# Patient Record
Sex: Female | Born: 1940
Health system: Southern US, Community
[De-identification: ages and names within clinical notes are randomized; demographics above are authoritative.]

## PROBLEM LIST (undated history)

## (undated) DIAGNOSIS — E079 Disorder of thyroid, unspecified: Secondary | ICD-10-CM

## (undated) DIAGNOSIS — R7303 Prediabetes: Secondary | ICD-10-CM

## (undated) DIAGNOSIS — C801 Malignant (primary) neoplasm, unspecified: Secondary | ICD-10-CM

## (undated) DIAGNOSIS — M5430 Sciatica, unspecified side: Secondary | ICD-10-CM

## (undated) DIAGNOSIS — K219 Gastro-esophageal reflux disease without esophagitis: Secondary | ICD-10-CM

## (undated) DIAGNOSIS — H409 Unspecified glaucoma: Secondary | ICD-10-CM

## (undated) DIAGNOSIS — K589 Irritable bowel syndrome without diarrhea: Secondary | ICD-10-CM

## (undated) DIAGNOSIS — Z85850 Personal history of malignant neoplasm of thyroid: Secondary | ICD-10-CM

## (undated) DIAGNOSIS — F419 Anxiety disorder, unspecified: Secondary | ICD-10-CM

## (undated) DIAGNOSIS — T7840XA Allergy, unspecified, initial encounter: Secondary | ICD-10-CM

## (undated) DIAGNOSIS — R0600 Dyspnea, unspecified: Secondary | ICD-10-CM

## (undated) DIAGNOSIS — H269 Unspecified cataract: Secondary | ICD-10-CM

## (undated) DIAGNOSIS — I10 Essential (primary) hypertension: Secondary | ICD-10-CM

## (undated) DIAGNOSIS — M1990 Unspecified osteoarthritis, unspecified site: Secondary | ICD-10-CM

## (undated) DIAGNOSIS — R011 Cardiac murmur, unspecified: Secondary | ICD-10-CM

## (undated) DIAGNOSIS — Z973 Presence of spectacles and contact lenses: Secondary | ICD-10-CM

## (undated) DIAGNOSIS — E559 Vitamin D deficiency, unspecified: Secondary | ICD-10-CM

## (undated) DIAGNOSIS — E785 Hyperlipidemia, unspecified: Secondary | ICD-10-CM

## (undated) HISTORY — DX: Disorder of thyroid, unspecified: E07.9

## (undated) HISTORY — DX: Essential (primary) hypertension: I10

## (undated) HISTORY — DX: Hyperlipidemia, unspecified: E78.5

## (undated) HISTORY — DX: Sciatica, unspecified side: M54.30

## (undated) HISTORY — DX: Cardiac murmur, unspecified: R01.1

## (undated) HISTORY — DX: Allergy, unspecified, initial encounter: T78.40XA

## (undated) HISTORY — DX: Anxiety disorder, unspecified: F41.9

## (undated) HISTORY — DX: Gastro-esophageal reflux disease without esophagitis: K21.9

## (undated) HISTORY — DX: Irritable bowel syndrome, unspecified: K58.9

## (undated) HISTORY — DX: Prediabetes: R73.03

## (undated) HISTORY — DX: Vitamin D deficiency, unspecified: E55.9

## (undated) HISTORY — DX: Unspecified cataract: H26.9

## (undated) HISTORY — DX: Dyspnea, unspecified: R06.00

## (undated) HISTORY — DX: Unspecified osteoarthritis, unspecified site: M19.90

## (undated) HISTORY — DX: Personal history of malignant neoplasm of thyroid: Z85.850

## (undated) HISTORY — DX: Malignant (primary) neoplasm, unspecified: C80.1

## (undated) HISTORY — PX: COLONOSCOPY: SHX174

## (undated) HISTORY — PX: CATARACT EXTRACTION, BILATERAL: SHX1313

## (undated) HISTORY — PX: EYE SURGERY: SHX253

---

## 1984-03-18 HISTORY — PX: THYROIDECTOMY, PARTIAL: SHX18

## 1984-03-18 HISTORY — PX: THYROID SURGERY: SHX805

## 1998-11-28 ENCOUNTER — Encounter (INDEPENDENT_AMBULATORY_CARE_PROVIDER_SITE_OTHER): Payer: Self-pay | Admitting: Specialist

## 1998-11-28 ENCOUNTER — Other Ambulatory Visit: Admission: RE | Admit: 1998-11-28 | Discharge: 1998-11-28 | Payer: Self-pay | Admitting: *Deleted

## 1999-08-22 ENCOUNTER — Other Ambulatory Visit: Admission: RE | Admit: 1999-08-22 | Discharge: 1999-08-22 | Payer: Self-pay | Admitting: *Deleted

## 2000-06-26 ENCOUNTER — Other Ambulatory Visit: Admission: RE | Admit: 2000-06-26 | Discharge: 2000-06-26 | Payer: Self-pay | Admitting: Internal Medicine

## 2001-10-15 ENCOUNTER — Other Ambulatory Visit: Admission: RE | Admit: 2001-10-15 | Discharge: 2001-10-15 | Payer: Self-pay | Admitting: Internal Medicine

## 2003-10-25 ENCOUNTER — Other Ambulatory Visit: Admission: RE | Admit: 2003-10-25 | Discharge: 2003-10-25 | Payer: Self-pay | Admitting: Internal Medicine

## 2004-03-18 HISTORY — PX: CARDIAC CATHETERIZATION: SHX172

## 2004-09-13 ENCOUNTER — Ambulatory Visit (HOSPITAL_COMMUNITY): Admission: RE | Admit: 2004-09-13 | Discharge: 2004-09-13 | Payer: Self-pay | Admitting: Cardiology

## 2005-11-06 ENCOUNTER — Other Ambulatory Visit: Admission: RE | Admit: 2005-11-06 | Discharge: 2005-11-06 | Payer: Self-pay | Admitting: Internal Medicine

## 2007-02-16 ENCOUNTER — Ambulatory Visit (HOSPITAL_COMMUNITY): Admission: RE | Admit: 2007-02-16 | Discharge: 2007-02-16 | Payer: Self-pay | Admitting: Internal Medicine

## 2008-02-25 ENCOUNTER — Encounter: Payer: Self-pay | Admitting: Critical Care Medicine

## 2008-03-08 ENCOUNTER — Ambulatory Visit: Admission: RE | Admit: 2008-03-08 | Discharge: 2008-03-08 | Payer: Self-pay | Admitting: Internal Medicine

## 2008-03-18 DIAGNOSIS — C801 Malignant (primary) neoplasm, unspecified: Secondary | ICD-10-CM

## 2008-03-18 HISTORY — DX: Malignant (primary) neoplasm, unspecified: C80.1

## 2008-03-18 HISTORY — PX: BREAST LUMPECTOMY: SHX2

## 2008-03-22 DIAGNOSIS — K219 Gastro-esophageal reflux disease without esophagitis: Secondary | ICD-10-CM

## 2008-03-22 DIAGNOSIS — K589 Irritable bowel syndrome without diarrhea: Secondary | ICD-10-CM

## 2008-03-22 DIAGNOSIS — E039 Hypothyroidism, unspecified: Secondary | ICD-10-CM

## 2008-03-22 DIAGNOSIS — E782 Mixed hyperlipidemia: Secondary | ICD-10-CM

## 2008-03-22 DIAGNOSIS — Z85850 Personal history of malignant neoplasm of thyroid: Secondary | ICD-10-CM

## 2008-03-22 DIAGNOSIS — R42 Dizziness and giddiness: Secondary | ICD-10-CM

## 2008-03-23 ENCOUNTER — Ambulatory Visit: Payer: Self-pay | Admitting: Critical Care Medicine

## 2008-03-23 ENCOUNTER — Encounter (INDEPENDENT_AMBULATORY_CARE_PROVIDER_SITE_OTHER): Payer: Self-pay | Admitting: *Deleted

## 2008-04-04 ENCOUNTER — Ambulatory Visit (HOSPITAL_COMMUNITY): Admission: RE | Admit: 2008-04-04 | Discharge: 2008-04-04 | Payer: Self-pay | Admitting: Critical Care Medicine

## 2008-04-04 ENCOUNTER — Encounter: Payer: Self-pay | Admitting: Critical Care Medicine

## 2008-04-11 ENCOUNTER — Telehealth: Payer: Self-pay | Admitting: Critical Care Medicine

## 2008-04-20 ENCOUNTER — Ambulatory Visit: Payer: Self-pay | Admitting: Internal Medicine

## 2008-04-20 ENCOUNTER — Ambulatory Visit: Payer: Self-pay | Admitting: Critical Care Medicine

## 2008-04-28 ENCOUNTER — Encounter: Payer: Self-pay | Admitting: Critical Care Medicine

## 2008-05-02 ENCOUNTER — Encounter: Payer: Self-pay | Admitting: Critical Care Medicine

## 2008-11-14 ENCOUNTER — Encounter: Admission: RE | Admit: 2008-11-14 | Discharge: 2008-11-14 | Payer: Self-pay

## 2008-11-22 ENCOUNTER — Encounter: Admission: RE | Admit: 2008-11-22 | Discharge: 2008-11-22 | Payer: Self-pay | Admitting: General Surgery

## 2008-11-23 ENCOUNTER — Ambulatory Visit (HOSPITAL_BASED_OUTPATIENT_CLINIC_OR_DEPARTMENT_OTHER): Admission: RE | Admit: 2008-11-23 | Discharge: 2008-11-23 | Payer: Self-pay | Admitting: General Surgery

## 2008-11-23 ENCOUNTER — Encounter (INDEPENDENT_AMBULATORY_CARE_PROVIDER_SITE_OTHER): Payer: Self-pay | Admitting: General Surgery

## 2008-11-25 ENCOUNTER — Ambulatory Visit: Payer: Self-pay | Admitting: Oncology

## 2008-12-21 LAB — CBC WITH DIFFERENTIAL/PLATELET
BASO%: 0.5 % (ref 0.0–2.0)
Basophils Absolute: 0 10*3/uL (ref 0.0–0.1)
EOS%: 1.2 % (ref 0.0–7.0)
HCT: 39.9 % (ref 34.8–46.6)
HGB: 13.6 g/dL (ref 11.6–15.9)
LYMPH%: 27 % (ref 14.0–49.7)
MCH: 30.8 pg (ref 25.1–34.0)
MCHC: 34.2 g/dL (ref 31.5–36.0)
MCV: 90.2 fL (ref 79.5–101.0)
MONO%: 11.2 % (ref 0.0–14.0)
NEUT%: 60.1 % (ref 38.4–76.8)
lymph#: 1.5 10*3/uL (ref 0.9–3.3)

## 2008-12-21 LAB — COMPREHENSIVE METABOLIC PANEL
AST: 19 U/L (ref 0–37)
BUN: 12 mg/dL (ref 6–23)
Calcium: 9.5 mg/dL (ref 8.4–10.5)
Chloride: 106 mEq/L (ref 96–112)
Creatinine, Ser: 0.85 mg/dL (ref 0.40–1.20)
Total Bilirubin: 0.4 mg/dL (ref 0.3–1.2)

## 2008-12-21 LAB — VITAMIN D 25 HYDROXY (VIT D DEFICIENCY, FRACTURES): Vit D, 25-Hydroxy: 54 ng/mL (ref 30–89)

## 2008-12-21 LAB — LACTATE DEHYDROGENASE: LDH: 160 U/L (ref 94–250)

## 2008-12-27 ENCOUNTER — Ambulatory Visit: Admission: RE | Admit: 2008-12-27 | Discharge: 2009-02-24 | Payer: Self-pay | Admitting: Radiation Oncology

## 2009-03-01 ENCOUNTER — Ambulatory Visit: Payer: Self-pay | Admitting: Oncology

## 2009-03-18 LAB — HM COLONOSCOPY

## 2009-06-05 ENCOUNTER — Ambulatory Visit: Payer: Self-pay | Admitting: Oncology

## 2009-06-07 LAB — COMPREHENSIVE METABOLIC PANEL
Alkaline Phosphatase: 54 U/L (ref 39–117)
BUN: 11 mg/dL (ref 6–23)
CO2: 24 mEq/L (ref 19–32)
Chloride: 102 mEq/L (ref 96–112)
Sodium: 138 mEq/L (ref 135–145)
Total Bilirubin: 0.3 mg/dL (ref 0.3–1.2)
Total Protein: 7.1 g/dL (ref 6.0–8.3)

## 2009-06-07 LAB — CBC WITH DIFFERENTIAL/PLATELET
EOS%: 1.2 % (ref 0.0–7.0)
HGB: 13.1 g/dL (ref 11.6–15.9)
LYMPH%: 23.6 % (ref 14.0–49.7)
MCH: 31.5 pg (ref 25.1–34.0)
MCHC: 34.8 g/dL (ref 31.5–36.0)
MONO#: 0.5 10*3/uL (ref 0.1–0.9)
MONO%: 9.3 % (ref 0.0–14.0)
NEUT%: 65.2 % (ref 38.4–76.8)
lymph#: 1.3 10*3/uL (ref 0.9–3.3)

## 2009-06-07 LAB — LACTATE DEHYDROGENASE: LDH: 160 U/L (ref 94–250)

## 2010-03-26 ENCOUNTER — Other Ambulatory Visit
Admission: RE | Admit: 2010-03-26 | Discharge: 2010-03-26 | Payer: Self-pay | Source: Home / Self Care | Admitting: Internal Medicine

## 2010-06-18 ENCOUNTER — Ambulatory Visit (HOSPITAL_COMMUNITY)
Admission: RE | Admit: 2010-06-18 | Discharge: 2010-06-18 | Disposition: A | Discharge status: Home / Self Care | Payer: Medicare Other | Source: Ambulatory Visit | Attending: Oncology | Admitting: Oncology

## 2010-06-18 ENCOUNTER — Other Ambulatory Visit: Payer: Self-pay | Admitting: Oncology

## 2010-06-18 ENCOUNTER — Encounter (HOSPITAL_BASED_OUTPATIENT_CLINIC_OR_DEPARTMENT_OTHER): Payer: Self-pay | Admitting: Oncology

## 2010-06-18 DIAGNOSIS — D0590 Unspecified type of carcinoma in situ of unspecified breast: Secondary | ICD-10-CM | POA: Insufficient documentation

## 2010-06-18 DIAGNOSIS — R0789 Other chest pain: Secondary | ICD-10-CM | POA: Insufficient documentation

## 2010-06-18 DIAGNOSIS — D099 Carcinoma in situ, unspecified: Secondary | ICD-10-CM

## 2010-06-18 DIAGNOSIS — C50519 Malignant neoplasm of lower-outer quadrant of unspecified female breast: Secondary | ICD-10-CM

## 2010-06-18 LAB — CBC WITH DIFFERENTIAL/PLATELET
Basophils Absolute: 0 10*3/uL (ref 0.0–0.1)
HCT: 37.7 % (ref 34.8–46.6)
HGB: 12.8 g/dL (ref 11.6–15.9)
MCV: 90.9 fL (ref 79.5–101.0)
RDW: 13.7 % (ref 11.2–14.5)
lymph#: 1.3 10*3/uL (ref 0.9–3.3)

## 2010-06-19 LAB — COMPREHENSIVE METABOLIC PANEL
CO2: 27 mEq/L (ref 19–32)
Chloride: 105 mEq/L (ref 96–112)
Total Bilirubin: 0.3 mg/dL (ref 0.3–1.2)

## 2010-06-19 LAB — CANCER ANTIGEN 27.29: CA 27.29: 27 U/mL (ref 0–39)

## 2010-06-19 LAB — LACTATE DEHYDROGENASE: LDH: 134 U/L (ref 94–250)

## 2010-06-22 LAB — COMPREHENSIVE METABOLIC PANEL
Albumin: 3.9 g/dL (ref 3.5–5.2)
CO2: 28 mEq/L (ref 19–32)
Chloride: 104 mEq/L (ref 96–112)
GFR calc non Af Amer: >60 mL/min (ref >60)
Glucose, Bld: 91 mg/dL (ref 70–99)
Total Protein: 6.8 g/dL (ref 6.0–8.3)

## 2010-06-22 LAB — DIFFERENTIAL
Basophils Absolute: 0 10*3/uL (ref 0.0–0.1)
Basophils Relative: 1 % (ref 0–1)
Eosinophils Absolute: 0.1 10*3/uL (ref 0.0–0.7)
Monocytes Relative: 9 % (ref 3–12)
Neutro Abs: 5.3 10*3/uL (ref 1.7–7.7)
Neutrophils Relative %: 69 % (ref 43–77)

## 2010-06-22 LAB — URINE MICROSCOPIC-ADD ON

## 2010-06-22 LAB — CBC
Hemoglobin: 12.8 g/dL (ref 12.0–15.0)
Platelets: 226 10*3/uL (ref 150–400)
RBC: 4.18 MIL/uL (ref 3.87–5.11)
RDW: 13.9 % (ref 11.5–15.5)
WBC: 7.8 10*3/uL (ref 4.0–10.5)

## 2010-06-22 LAB — URINALYSIS, ROUTINE W REFLEX MICROSCOPIC
Bilirubin Urine: NEGATIVE
Leukocytes, UA: NEGATIVE
Nitrite: NEGATIVE
Protein, ur: NEGATIVE mg/dL
Urobilinogen, UA: 0.2 mg/dL (ref 0.0–1.0)

## 2010-06-22 LAB — CANCER ANTIGEN 27.29: CA 27.29: 21 U/mL (ref 0–39)

## 2010-07-27 ENCOUNTER — Encounter (INDEPENDENT_AMBULATORY_CARE_PROVIDER_SITE_OTHER): Payer: Self-pay | Admitting: General Surgery

## 2010-08-03 NOTE — H&P (Signed)
NAME:  ZERAH, HILYER NO.:  [id number]   MEDICAL RECORD NO.:  [id number]          PATIENT TYPE:  OIB   LOCATION:                               FACILITY:  [facility]   PHYSICIAN:  Peter M. Swaziland, M.D.  DATE OF BIRTH:  July 09, 1940   DATE OF ADMISSION:  09/13/2004  DATE OF DISCHARGE:                                HISTORY & PHYSICAL   HISTORY OF PRESENT ILLNESS:  Ms. Digiulio is a very pleasant 70 year old  white female with history of severe hypercholesterolemia who is referred for  evaluation of dyspnea.  She describes symptoms of dyspnea and substernal  chest tightness for the past six weeks.  There is no clear relation to  activity or meals.  She has had a fairly extensive GI evaluation including  upper endoscopy and abdominal ultrasound by Dr. Kinnie Scales which were  unremarkable.  She has taken Nexium without relief.  She subsequently was  referred for exercise stress testing.  She walked 6 minutes with complaints  of substernal chest tightness and dyspnea.  ECG showed 2 mm of ST segment  depression in the lateral leads consistent with ischemia.  Given these  findings, she is now admitted for cardiac catheterization.   PAST MEDICAL HISTORY:  1.  Hypercholesterolemia.  2.  History of thyroid cancer status post thyroid surgery in 1986.   ALLERGIES:  DARVON.   CURRENT MEDICATIONS:  1.  Synthroid 0.112 mg daily.  2.  Nexium 40 mg per day.  3.  Evista daily.  4.  Flax seed oil daily.   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  She is married.  She has two children.  She denies  tobacco or alcohol use.   FAMILY HISTORY:  Father died of colon cancer at age 29.  Mother died at age  26 of a drowning.   REVIEW OF SYSTEMS:  Otherwise unremarkable.   PHYSICAL EXAMINATION:  GENERAL:  The patient is a pleasant white female in  no apparent distress.  VITAL SIGNS:  Blood pressure 122/78, pulse 80 and regular, rate 131,  respirations normal.  HEENT:  Pupils equal, round, and reactive.  Sclerae clear.  Oropharynx is  clear.  NECK:  Without JVD, adenopathy, thyromegaly, or bruits.  LUNGS: Clear.  CARDIAC:  Regular rate and rhythm without gallop, murmur, rub, or click.  ABDOMEN:  Soft and nontender without masses or bruits.  EXTREMITIES:  Without edema.  Pulses are 2+ and symmetric.  NEUROLOGIC:  Exam is nonfocal.   LABORATORY DATA:  ECG shows normal sinus rhythm with left axis deviation and  incomplete right bundle branch block.   CBC, BMET, and coags are unremarkable.  Previous lipoprotein now shows a  very high LDL particle number of 2288 with a small LDL particle of 1478  which is also high risk.  Her total LDL was 192 with HDL 49, triglycerides  of 161.   IMPRESSION:  1.  Symptoms of dyspnea with abnormal stress test suggesting lateral wall      ischemia.  2.  Severe hypercholesterolemia.  3.  Remote history of thyroid cancer.   PLAN:  I have recommended diagnostic cardiac catheterization with further  therapy pending these results.           ______________________________  Peter M. Swaziland, M.D.     PMJ/MEDQ  D:  09/11/2004  T:  09/11/2004  Job:  [id number]   cc:   Lovenia Kim, D.O.  [address]  [place]  [place] [address]  Fax: [phone]

## 2010-08-03 NOTE — Cardiovascular Report (Signed)
NAME:  Haley Shah, Haley Shah                ACCOUNT NO.:  [id number]   MEDICAL RECORD NO.:  [id number]          PATIENT TYPE:  OIB   LOCATION:  2899                         FACILITY:  [facility]   PHYSICIAN:  Peter M. Swaziland, M.D.  DATE OF BIRTH:  [birth year]   DATE OF PROCEDURE:  09/13/2004  DATE OF DISCHARGE:                              CARDIAC CATHETERIZATION   INDICATIONS FOR PROCEDURE:  The patient is a 70 year old white female with a  history of severe hypercholesterolemia who presents with symptoms of  dyspnea. Routine stress testing is suggestive of lateral ischemia and  reproduces the patient's symptoms.   PROCEDURES:  Left heart catheterization, coronary and left ventricular  angiography, access is via the right femoral artery using the standard  Seldinger technique.   EQUIPMENT:  6-French 4 cm right and left Judkins catheter, 6-French pigtail  catheter, 6-French arterial sheath.   CONTRAST:  90 cc of Omnipaque.   MEDICATIONS:  Local anesthesia with 1% Xylocaine.   HEMODYNAMIC DATA:  Aortic pressure is 123/64 with a mean of 89 mmHg. Left  ventricular pressure is 121 with an EDP of 5 mmHg.   ANGIOGRAPHIC DATA:  The left coronary artery arises and distributes  normally. The left main coronary is normal.   The left anterior descending artery is very tortuous. There is no disease  noted. The mid to distal LAD is small in caliber but there is a large  diagonal branch that comes off prior to this.   The left circumflex coronary is also very tortuous but is normal.   The right coronary is a dominant vessel and is normal.   Left ventricular angiography was performed in the RAO view. This  demonstrates normal left ventricular size and contractility with normal  systolic function. Ejection fraction is estimated at 70%. There is no mitral  valve prolapse or insufficiency. The aortic root appears normal in size.   IMPRESSION:  1. Normal coronary anatomy.  2. Normal left ventricular  function.   PLAN:  This would suggest that her stress test was falsely positive. Would  recommend continued risk factor management.       PMJ/MEDQ  D:  09/13/2004  T:  09/13/2004  Job:  [id number]   cc:   Lovenia Kim, D.O.  [address]  [place]  [place] [address]  Fax: [phone]

## 2010-11-05 ENCOUNTER — Ambulatory Visit (INDEPENDENT_AMBULATORY_CARE_PROVIDER_SITE_OTHER): Payer: Medicare Other | Admitting: General Surgery

## 2010-11-05 ENCOUNTER — Encounter (INDEPENDENT_AMBULATORY_CARE_PROVIDER_SITE_OTHER): Payer: Self-pay | Admitting: General Surgery

## 2010-11-05 VITALS — BP 142/74 | HR 64 | Temp 97.8°F | Ht 62.0 in | Wt 124.8 lb

## 2010-11-05 DIAGNOSIS — Z853 Personal history of malignant neoplasm of breast: Secondary | ICD-10-CM

## 2010-11-05 NOTE — Progress Notes (Signed)
Chief Complaint  Patient presents with  . Breast Cancer Long Term Follow Up    HPI Haley Shah is a 70 y.o. female.    This patient has invasive and in situ cancer of the left breast. Clinical and pathologic stage T1b., N0. ER-positive, PR negative, Ki-67 10%, HER-2 negative.  On November 23, 2008 she underwent left partial mastectomy and sentinel lobe biopsy. She has recovered from that surgery uneventfully and has no complaints about either breast.  She is on tamoxifen and sees Dr. Donnie Coffin every 6 months.  She is scheduled for mammograms this coming Thursday, November 08, 2010.HPI  Past Medical History  Diagnosis Date  . Cancer     Past Surgical History  Procedure Date  . Breast lumpectomy 2010    LEFT BREAST  . Thyroid surgery 1986    History reviewed. No pertinent family history.  Social History History  Substance Use Topics  . Smoking status: Never Smoker   . Smokeless tobacco: Not on file  . Alcohol Use: No    Allergies  Allergen Reactions  . Propoxyphene Hcl     REACTION: syncope  . Triple Antibiotic     REACTION: rash  . Darvon Other (See Comments)    Almost fainted when taking, over 50 years.    Current Outpatient Prescriptions  Medication Sig Dispense Refill  . CALCIUM PO Take by mouth.        . Levothyroxine Sodium (LEVOXYL PO) Take by mouth.        . Probiotic Product (PROBIOTIC PO) Take by mouth as needed.        Marland Kitchen TAMOXIFEN CITRATE PO Take by mouth.        Marland Kitchen VITAMIN D, CHOLECALCIFEROL, PO Take by mouth.          Review of Systems ROS 10 system review of systems is performed and is negative except as described above. Blood pressure 142/74, pulse 64, temperature 97.8 F (36.6 C), temperature source Temporal, height 5\' 2"  (1.575 m), weight 124 lb 12.8 oz (56.609 kg).  Physical Exam Physical Exam  Patient looks well and is in no distress.  Neck no adenopathy no mass or jugular venous distention.  Lungs clear to auscultation. No chest wall  tenderness  Left breast  wounds are well healed. Contour and symmetry are pretty good. No palpable mass. No other skin changes. No axillary adenopathy.  Breasts reveals no mass, no skin changes, no axillary adenopathy.  Data Reviewed I reviewed her records from Dr. Renelda Loma office. She is scheduled for mammograms later this week. Assessment    Invasive carcinoma left breast, 3:00 position, pathology stage TI B., N0. No evidence of recurrence 2 years following left partial mastectomy and sentinel node biopsy, as the radiation therapy and adjuvant tamoxifen.    Plan    Bilateral mammogram scheduled for November 08, 2010.  Continue tamoxifen under the guidance of Dr. Pierce Crane.  Return to see me in one year.       Jeniece Hannis M 11/05/2010, 2:02 PM

## 2010-11-05 NOTE — Patient Instructions (Signed)
Your physical exam today reveals no evidence for breast cancer. Be sure to get sure mammograms this coming Thursday, August 23. Return to see Dr. Derrell Lolling in one year. Continue routine followup with Dr. Pierce Crane.

## 2010-11-15 ENCOUNTER — Encounter (INDEPENDENT_AMBULATORY_CARE_PROVIDER_SITE_OTHER): Payer: Self-pay | Admitting: General Surgery

## 2011-01-29 ENCOUNTER — Other Ambulatory Visit: Payer: Self-pay | Admitting: *Deleted

## 2011-01-29 DIAGNOSIS — C50519 Malignant neoplasm of lower-outer quadrant of unspecified female breast: Secondary | ICD-10-CM

## 2011-01-29 MED ORDER — TAMOXIFEN CITRATE 20 MG PO TABS: 20.0000 mg | ORAL_TABLET | Freq: Every day | ORAL | Status: AC

## 2011-06-26 ENCOUNTER — Ambulatory Visit: Payer: Medicare Other | Admitting: Oncology

## 2011-06-26 ENCOUNTER — Other Ambulatory Visit (HOSPITAL_BASED_OUTPATIENT_CLINIC_OR_DEPARTMENT_OTHER): Payer: Medicare Other | Admitting: Lab

## 2011-06-26 DIAGNOSIS — C50519 Malignant neoplasm of lower-outer quadrant of unspecified female breast: Secondary | ICD-10-CM

## 2011-06-26 DIAGNOSIS — D0590 Unspecified type of carcinoma in situ of unspecified breast: Secondary | ICD-10-CM

## 2011-06-26 LAB — CBC WITH DIFFERENTIAL/PLATELET
BASO%: 0.9 % (ref 0.0–2.0)
EOS%: 1.2 % (ref 0.0–7.0)
MCH: 31.1 pg (ref 25.1–34.0)
MCV: 91 fL (ref 79.5–101.0)
MONO%: 13.1 % (ref 0.0–14.0)
RBC: 4.16 10*6/uL (ref 3.70–5.45)
RDW: 13.6 % (ref 11.2–14.5)
lymph#: 1.6 10*3/uL (ref 0.9–3.3)
nRBC: 0 % (ref 0–0)

## 2011-06-27 LAB — COMPREHENSIVE METABOLIC PANEL
ALT: 8 U/L (ref 0–35)
AST: 14 U/L (ref 0–37)
Alkaline Phosphatase: 46 U/L (ref 39–117)
Sodium: 141 mEq/L (ref 135–145)
Total Bilirubin: 0.3 mg/dL (ref 0.3–1.2)
Total Protein: 6.6 g/dL (ref 6.0–8.3)

## 2011-06-27 LAB — CANCER ANTIGEN 27.29: CA 27.29: 25 U/mL (ref 0–39)

## 2011-07-03 ENCOUNTER — Ambulatory Visit (HOSPITAL_BASED_OUTPATIENT_CLINIC_OR_DEPARTMENT_OTHER): Payer: Medicare Other | Admitting: Oncology

## 2011-07-03 ENCOUNTER — Telehealth: Payer: Self-pay | Admitting: *Deleted

## 2011-07-03 VITALS — BP 155/81 | HR 76 | Temp 98.5°F | Ht 62.0 in | Wt 126.8 lb

## 2011-07-03 DIAGNOSIS — Z170 Estrogen receptor positive status [ER+]: Secondary | ICD-10-CM

## 2011-07-03 DIAGNOSIS — C50519 Malignant neoplasm of lower-outer quadrant of unspecified female breast: Secondary | ICD-10-CM

## 2011-07-03 DIAGNOSIS — C50919 Malignant neoplasm of unspecified site of unspecified female breast: Secondary | ICD-10-CM

## 2011-07-03 MED ORDER — ANASTROZOLE 1 MG PO TABS: 1.0000 mg | ORAL_TABLET | Freq: Every day | ORAL | Status: AC

## 2011-07-03 NOTE — Progress Notes (Signed)
Hematology and Oncology Follow Up Visit  Haley Shah [id number] 1940-12-12 71 y.o. 07/03/2011 11:03 AM PCP Dr. Derrell Lolling Dr. Rosanna Randy Principle Diagnosis: 71 year old woman with history of ER/PR positive breast cancer, status post lumpectomy August 2010, status post radiation therapy completed December 2000 and on tamoxifen since  Interim History:  There have been no intercurrent illness, hospitalizations or medication changes. Last mammogram was within normal limits, bone density done in August showed improvement in bone density and now technically within normal range.  Medications: I have reviewed the patient's current medications.  Allergies:  Allergies  Allergen Reactions  . Propoxyphene Hcl     REACTION: syncope  . Triple Antibiotic     REACTION: rash  . Darvon Other (See Comments)    Almost fainted when taking, over 50 years.    Past Medical History, Surgical history, Social history, and Family History were reviewed and updated.  Review of Systems: Constitutional:  Negative for fever, chills, night sweats, anorexia, weight loss, pain. Cardiovascular: no chest pain or dyspnea on exertion Respiratory: negative Neurological: negative Dermatological: negative ENT: negative Skin Gastrointestinal: negative Genito-Urinary: negative Hematological and Lymphatic: negative Breast: negative Musculoskeletal: negative Remaining ROS negative.  Physical Exam: Blood pressure 155/81, pulse 76, temperature 98.5 F (36.9 C), temperature source Oral, height 5\' 2"  (1.575 m), weight 126 lb 12.8 oz (57.516 kg). ECOG: 0 General appearance: alert, cooperative and appears stated age Head: Normocephalic, without obvious abnormality, atraumatic Neck: no adenopathy, no carotid bruit, no JVD, supple, symmetrical, trachea midline and thyroid not enlarged, symmetric, no tenderness/mass/nodules Lymph nodes: Cervical, supraclavicular, and axillary nodes normal. Cardiac : regular rate and  rhythm, no murmurs or gallops Pulmonary:clear to auscultation bilaterally and normal percussion bilaterally Breasts: inspection negative, no nipple discharge or bleeding, no masses or nodularity palpable Abdomen:soft, non-tender; bowel sounds normal; no masses,  no organomegaly Extremities negative Neuro: alert, oriented, normal speech, no focal findings or movement disorder noted  Lab Results: Lab Results  Component Value Date   WBC 5.6 06/26/2011   HGB 12.9 06/26/2011   HCT 37.8 06/26/2011   MCV 91.0 06/26/2011   PLT 219 06/26/2011     Chemistry      Component Value Date/Time   NA 141 06/26/2011 0942   K 4.2 06/26/2011 0942   CL 107 06/26/2011 0942   CO2 28 06/26/2011 0942   BUN 14 06/26/2011 0942   CREATININE 0.87 06/26/2011 0942      Component Value Date/Time   CALCIUM 9.0 06/26/2011 0942   ALKPHOS 46 06/26/2011 0942   AST 14 06/26/2011 0942   ALT 8 06/26/2011 0942   BILITOT 0.3 06/26/2011 0942      .pathology. Radiological Studies: chest X-ray n/a Mammogram Due 8/13 Bone density wnl  Impression and Plan: Patient is doing well. No clinical evidence of recurrence. I will see her in a years time and alternate visits with the surgeon. We will obtain imaging studies in August. Current labs are within normal limits.  More than 50% of the visit was spent in patient-related counselling   Pierce Crane, MD 4/17/201311:03 AM

## 2011-07-03 NOTE — Telephone Encounter (Signed)
patient has appointment at [facility] on 11-11-2011 at 10:30am mammogram gave patient appointment for 2014 printed out calendar and gave patient  to the patient

## 2011-08-11 ENCOUNTER — Encounter: Payer: Self-pay | Admitting: *Deleted

## 2011-08-28 ENCOUNTER — Encounter (INDEPENDENT_AMBULATORY_CARE_PROVIDER_SITE_OTHER): Payer: Self-pay | Admitting: General Surgery

## 2011-10-30 ENCOUNTER — Ambulatory Visit (INDEPENDENT_AMBULATORY_CARE_PROVIDER_SITE_OTHER): Payer: Medicare Other | Admitting: General Surgery

## 2011-10-30 ENCOUNTER — Encounter (INDEPENDENT_AMBULATORY_CARE_PROVIDER_SITE_OTHER): Payer: Self-pay | Admitting: General Surgery

## 2011-10-30 VITALS — BP 124/70 | HR 68 | Temp 97.1°F | Resp 16 | Ht 61.5 in | Wt 127.6 lb

## 2011-10-30 DIAGNOSIS — C50419 Malignant neoplasm of upper-outer quadrant of unspecified female breast: Secondary | ICD-10-CM

## 2011-10-30 NOTE — Progress Notes (Signed)
Patient ID: Haley Shah, female   DOB: 01-Oct-1940, 71 y.o.   MRN: [id number]  Chief Complaint  Patient presents with  . Breast Cancer Long Term Follow Up    HPI Haley Shah is a 71 y.o. female.  She returns for long-term followup regarding her left breast cancer.  On 11/23/2008 this patient underwent left partial mastectomy and sentinel node biopsy. This was invasive and in situ cancer of the left breast in the upper outer quadrant. Stage TI B., N0, ER positive, PR-negative, Ki 67 10%, HER-2 negative. She has done well.  She sees Dr. Donnie Coffin and has now been switched over to a Regranex. Her last mammograms were 11/08/2010. She is scheduled for mammograms on 12/10/2011.  She has no complaints about her breast. Her only  complaint is a chronic intermittent skin rash of the left chest wall below the inframammary crease. She has seen Dr. Karlyn Agee in the past and his uses triamcinolone cream. She's going to see him in October. She says this rash began after radiation therapy. HPI  Past Medical History  Diagnosis Date  . Cancer   . Dyspnea   . GERD (gastroesophageal reflux disease)   . History of thyroid cancer   . IBS (irritable bowel syndrome)     Past Surgical History  Procedure Date  . Breast lumpectomy 2010    LEFT BREAST  . Thyroid surgery 1986    Family History  Problem Relation Age of Onset  . Colon cancer Father     Social History History  Substance Use Topics  . Smoking status: Never Smoker   . Smokeless tobacco: Not on file  . Alcohol Use: No    Allergies  Allergen Reactions  . Neomycin-Bacitracin Zn-Polymyx     REACTION: rash  . Propoxyphene Hcl     REACTION: syncope  . Darvon Other (See Comments)    Almost fainted when taking, over 50 years.    Current Outpatient Prescriptions  Medication Sig Dispense Refill  . anastrozole (ARIMIDEX) 1 MG tablet       . CALCIUM PO Take by mouth.        . Levothyroxine Sodium (LEVOXYL PO) Take by mouth.        Marland Kitchen  VITAMIN D, CHOLECALCIFEROL, PO Take by mouth.          Review of Systems Review of Systems  Constitutional: Negative for fever, chills and unexpected weight change.  HENT: Negative for hearing loss, congestion, sore throat, trouble swallowing and voice change.   Eyes: Negative for visual disturbance.  Respiratory: Negative for cough and wheezing.   Cardiovascular: Negative for chest pain, palpitations and leg swelling.  Gastrointestinal: Negative for nausea, vomiting, abdominal pain, diarrhea, constipation, blood in stool, abdominal distention and anal bleeding.  Genitourinary: Negative for hematuria, vaginal bleeding and difficulty urinating.  Musculoskeletal: Negative for arthralgias.  Skin: Positive for rash. Negative for wound.  Neurological: Negative for seizures, syncope and headaches.  Hematological: Negative for adenopathy. Does not bruise/bleed easily.  Psychiatric/Behavioral: Negative for confusion.    Blood pressure 124/70, pulse 68, temperature 97.1 F (36.2 C), temperature source Temporal, resp. rate 16, height 5' 1.5" (1.562 m), weight 127 lb 9.6 oz (57.879 kg).  Physical Exam Physical Exam  Constitutional: She is oriented to person, place, and time. She appears well-developed and well-nourished. No distress.  HENT:  Head: Normocephalic and atraumatic.  Neck: Neck supple. No JVD present. No tracheal deviation present. No thyromegaly present.  Cardiovascular: Normal rate, regular rhythm, normal  heart sounds and intact distal pulses.   No murmur heard. Pulmonary/Chest: Effort normal and breath sounds normal. No respiratory distress. She has no wheezes. She has no rales. She exhibits no tenderness.       Breasts are small to medium in size. Well-healed scar lateral left breast and well-healed axillary scar. No other skin changes. No palpable mass on either side. No axillary adenopathy.  Musculoskeletal: She exhibits no edema and no tenderness.  Lymphadenopathy:    She has  no cervical adenopathy.  Neurological: She is alert and oriented to person, place, and time. She exhibits normal muscle tone. Coordination normal.  Skin: Skin is warm. Rash noted. She is not diaphoretic. No erythema. No pallor.       4 x 5 cm area chronic erythematous skin rash left chest wall, midclavicular line, well below the inframammary crease. This  rash doess not extend up into the breast.  Psychiatric: She has a normal mood and affect. Her behavior is normal. Judgment and thought content normal.    Data Reviewed   Assessment    Invasive and in situ cancer left breast, upper outer quadrant, stage TI B., N0, receptor positive, HER-2 negative.  No evidence of recurrence 3 years following left partial mastectomy and sentinel node biopsy  Chronic skin rash, left chest wall, etiology unclear, followed by dermatology    Plan    Mammograms on 12/10/2011.  Continue the arimidex and follow-up with Dr. Pierce Crane  Appointment with Dr. Karlyn Agee in October regarding chronic skin rash  Return to see me in one year.       Angelia Mould. Derrell Lolling, M.D., [facility] General and Minimally invasive Surgery Breast and Colorectal Surgery Office:   [phone] Pager:   [phone]  10/30/2011, 12:08 PM

## 2011-10-30 NOTE — Patient Instructions (Signed)
Your physical exam today is normal. There is no evidence of breast cancer or enlarged lymph nodes.Be sure to get mammograms in September. Continue taking arimidex. .  See Dr. Arminda Resides regarding the chronic skin rash on her left rib cage.  Return to see Dr. Derrell Lolling in one year.

## 2011-11-17 LAB — HM MAMMOGRAPHY

## 2012-02-05 ENCOUNTER — Encounter: Payer: Self-pay | Admitting: Cardiology

## 2012-03-31 ENCOUNTER — Encounter (INDEPENDENT_AMBULATORY_CARE_PROVIDER_SITE_OTHER): Payer: Self-pay

## 2012-04-23 ENCOUNTER — Other Ambulatory Visit: Payer: Self-pay | Admitting: *Deleted

## 2012-04-23 DIAGNOSIS — C50919 Malignant neoplasm of unspecified site of unspecified female breast: Secondary | ICD-10-CM

## 2012-04-23 MED ORDER — ANASTROZOLE 1 MG PO TABS: 1.0000 mg | ORAL_TABLET | Freq: Every day | ORAL | Status: DC

## 2012-04-23 NOTE — Telephone Encounter (Signed)
Received call from patient about changing her pharmacy to a mail order and also was asking about her appt. Confirmed appt. For 07/15/12 at 10am with Jacquie Hunter,NP.  Then will become Dr. Welton Flakes per patient request.

## 2012-04-24 ENCOUNTER — Encounter: Payer: Self-pay | Admitting: Oncology

## 2012-07-02 ENCOUNTER — Other Ambulatory Visit: Payer: Medicare Other | Admitting: Lab

## 2012-07-02 ENCOUNTER — Ambulatory Visit: Payer: Medicare Other | Admitting: Oncology

## 2012-07-15 ENCOUNTER — Encounter: Payer: Self-pay | Admitting: Family

## 2012-07-15 ENCOUNTER — Telehealth: Payer: Self-pay | Admitting: *Deleted

## 2012-07-15 ENCOUNTER — Ambulatory Visit (HOSPITAL_BASED_OUTPATIENT_CLINIC_OR_DEPARTMENT_OTHER): Payer: Medicare Other | Admitting: Family

## 2012-07-15 ENCOUNTER — Other Ambulatory Visit: Payer: Self-pay | Admitting: Lab

## 2012-07-15 ENCOUNTER — Ambulatory Visit: Payer: Self-pay | Admitting: Oncology

## 2012-07-15 VITALS — BP 150/81 | HR 60 | Temp 98.0°F | Resp 20 | Ht 61.0 in | Wt 126.4 lb

## 2012-07-15 DIAGNOSIS — Z853 Personal history of malignant neoplasm of breast: Secondary | ICD-10-CM

## 2012-07-15 DIAGNOSIS — C50912 Malignant neoplasm of unspecified site of left female breast: Secondary | ICD-10-CM

## 2012-07-15 DIAGNOSIS — C50919 Malignant neoplasm of unspecified site of unspecified female breast: Secondary | ICD-10-CM

## 2012-07-15 MED ORDER — ANASTROZOLE 1 MG PO TABS: 1.0000 mg | ORAL_TABLET | Freq: Every day | ORAL | Status: DC

## 2012-07-15 NOTE — Patient Instructions (Addendum)
Please contact us at [phone] if you have any questions or concerns.  Calcium 600 mg daily and Vitamin D3 1000 IU daily  Think about trying a Super B-Complex vitamin or 1 Magnesium tablet (no more than 400 mg) daily for leg discomfort.  Health Maintenance, Females  A healthy lifestyle and preventative care can promote health and wellness.  Maintain regular health, dental, and eye exams.  Eat a healthy diet. Foods like vegetables, fruits, whole grains, low-fat dairy products, and lean protein foods contain the nutrients you need without too many calories. Decrease your intake of foods high in solid fats, added sugars, and salt. Get information about a proper diet from your caregiver, if necessary.  Regular physical exercise is one of the most important things you can do for your health. Most adults should get at least 150 minutes of moderate-intensity exercise (any activity that increases your heart rate and causes you to sweat) each week. In addition, most adults need muscle-strengthening exercises on 2 or more days a week.  Maintain a healthy weight. The body mass index (BMI) is a screening tool to identify possible weight problems. It provides an estimate of body fat based on height and weight. Your caregiver can help determine your BMI, and can help you achieve or maintain a healthy weight. For adults 20 years and older:  A BMI below 18.5 is considered underweight.  A BMI of 18.5 to 24.9 is normal.  A BMI of 25 to 29.9 is considered overweight.  A BMI of 30 and above is considered obese.  Maintain normal blood lipids and cholesterol by exercising and minimizing your intake of saturated fat. Eat a balanced diet with plenty of fruits and vegetables. Blood tests for lipids and cholesterol should begin at age 38 and be repeated every 5 years. If your lipid or cholesterol levels are high, you are over 50, or you are a high risk for heart disease, you may need your cholesterol levels checked more  frequently. Ongoing high lipid and cholesterol levels should be treated with medicines if diet and exercise are not effective.  If you smoke, find out from your caregiver how to quit. If you do not use tobacco, do not start.  If you are pregnant, do not drink alcohol. If you are breastfeeding, be very cautious about drinking alcohol. If you are not pregnant and choose to drink alcohol, do not exceed 1 drink per day. One drink is considered to be 12 ounces (355 mL) of beer, 5 ounces (148 mL) of wine, or 1.5 ounces (44 mL) of liquor.  Avoid use of street drugs. Do not share needles with anyone. Ask for help if you need support or instructions about stopping the use of drugs.  High blood pressure causes heart disease and increases the risk of stroke. Blood pressure should be checked at least every 1 to 2 years. Ongoing high blood pressure should be treated with medicines, if weight loss and exercise are not effective.  If you are 72 to 74 years old, ask your caregiver if you should take aspirin to prevent strokes.  Diabetes screening involves taking a blood sample to check your fasting blood sugar level. This should be done once every 3 years, after age 72, if you are within normal weight and without risk factors for diabetes. Testing should be considered at a younger age or be carried out more frequently if you are overweight and have at least 1 risk factor for diabetes.  Breast cancer screening is  essential preventative care for women. You should practice "breast self-awareness." This means understanding the normal appearance and feel of your breasts and may include breast self-examination. Any changes detected, no matter how small, should be reported to a caregiver. Women in their 53s and 30s should have a clinical breast exam (CBE) by a caregiver as part of a regular health exam every 1 to 3 years. After age 103, women should have a CBE every year. Starting at age 49, women should consider having a mammogram  (breast X-ray) every year. Women who have a family history of breast cancer should talk to their caregiver about genetic screening. Women at a high risk of breast cancer should talk to their caregiver about having an MRI and a mammogram every year.  The Pap test is a screening test for cervical cancer. Women should have a Pap test starting at age 35. Between ages 59 and 35, Pap tests should be repeated every 2 years. Beginning at age 67, you should have a Pap test every 3 years as long as the past 3 Pap tests have been normal. If you had a hysterectomy for a problem that was not cancer or a condition that could lead to cancer, then you no longer need Pap tests. If you are between ages 65 and 46, and you have had normal Pap tests going back 10 years, you no longer need Pap tests. If you have had past treatment for cervical cancer or a condition that could lead to cancer, you need Pap tests and screening for cancer for at least 20 years after your treatment. If Pap tests have been discontinued, risk factors (such as a new sexual partner) need to be reassessed to determine if screening should be resumed. Some women have medical problems that increase the chance of getting cervical cancer. In these cases, your caregiver may recommend more frequent screening and Pap tests.  The human papillomavirus (HPV) test is an additional test that may be used for cervical cancer screening. The HPV test looks for the virus that can cause the cell changes on the cervix. The cells collected during the Pap test can be tested for HPV. The HPV test could be used to screen women aged 69 years and older, and should be used in women of any age who have unclear Pap test results. After the age of 16, women should have HPV testing at the same frequency as a Pap test.  Colorectal cancer can be detected and often prevented. Most routine colorectal cancer screening begins at the age of 44 and continues through age 48. However, your caregiver  may recommend screening at an earlier age if you have risk factors for colon cancer. On a yearly basis, your caregiver may provide home test kits to check for hidden blood in the stool. Use of a small camera at the end of a tube, to directly examine the colon (sigmoidoscopy or colonoscopy), can detect the earliest forms of colorectal cancer. Talk to your caregiver about this at age 36, when routine screening begins. Direct examination of the colon should be repeated every 5 to 10 years through age 8, unless early forms of pre-cancerous polyps or small growths are found.  Hepatitis C blood testing is recommended for all people born from [birth year] through 1967 and any individual with known risks for hepatitis C.  Practice safe sex. Use condoms and avoid high-risk sexual practices to reduce the spread of sexually transmitted infections (STIs). Sexually active women aged 49 and younger should  be checked for Chlamydia, which is a common sexually transmitted infection. Older women with new or multiple partners should also be tested for Chlamydia. Testing for other STIs is recommended if you are sexually active and at increased risk.  Osteoporosis is a disease in which the bones lose minerals and strength with aging. This can result in serious bone fractures. The risk of osteoporosis can be identified using a bone density scan. Women ages 44 and over and women at risk for fractures or osteoporosis should discuss screening with their caregivers. Ask your caregiver whether you should be taking a calcium supplement or vitamin D to reduce the rate of osteoporosis.  Menopause can be associated with physical symptoms and risks. Hormone replacement therapy is available to decrease symptoms and risks. You should talk to your caregiver about whether hormone replacement therapy is right for you.  Use sunscreen with a sun protection factor (SPF) of 30 or greater. Apply sunscreen liberally and repeatedly throughout the day. You  should seek shade when your shadow is shorter than you. Protect yourself by wearing long sleeves, pants, a wide-brimmed hat, and sunglasses year round, whenever you are outdoors.  Notify your caregiver of new moles or changes in moles, especially if there is a change in shape or color. Also notify your caregiver if a mole is larger than the size of a pencil eraser.  Stay current with your immunizations.  Document Released: 09/17/2010 Document Revised: 05/27/2011 Document Reviewed: 09/17/2010 [facility] Patient Information 2013 [place], [place].

## 2012-07-15 NOTE — Telephone Encounter (Signed)
appts made and printed. Also gv appt for [facility] a Bone Density and mammo scheduled for 12/14/12@ 10:30am..td

## 2012-07-15 NOTE — Progress Notes (Signed)
[facility]  Telephone:([phone] [phone]  OFFICE PROGRESS NOTE  PATIENT: Haley Shah   DOB: November 23, 1940  MR#: [id number]  GEX#:[id number]   WN:[id number],UYQIHKV DAVID, MD Meriam Sprague, M.D. Margaretmary Dys, M.D.   DIAGNOSIS: a 72 year old [place], [place] woman with the history of left breast invasive ductal carcinoma diagnosed in 10/2008.   PRIOR THERAPY: 1.  Status post left breasts needle core biopsy on 11/07/2008 which showed a 3 mm invasive ductal carcinoma with DCIS and calcifications/necrosis, stage 1, T1a, N0,  ER 100%, PR 0%, Ki-67 10%, HER2/neu negative by CISH with a ratio 0.87.   2.  Status post left press lumpectomy with sentinel node excision and re-excision of medial margin on 11/23/2008, which showed no residual invasive carcinoma identified, pTX, pN0, ER 100%, PR 0%, Ki-67 10%, HER2/neu by CISH no amplification, 0/3 positive lymph nodes.  3.  Status post radiation therapy from 01/05/2009 until 02/17/2009.  4.  The patient started antiestrogen therapy with Tamoxifen from 02/2009 through 06/2011.  The patient then switched antiestrogen therapy to Arimidex in 06/2011 until the present time.  CURRENT THERAPY:  Arimidex 1 mg PO daily since 06/2011.   INTERVAL HISTORY: Dr. Welton Flakes and I saw Haley Shah today for follow up of invasive ductal carcinoma of the left breast .  Her last office visit with Dr. Donnie Coffin was on 07/03/2011.  Since her last office visit the patient reports that she has ongoing leg pains with a history of sciatica and recent diagnosis of left heel tendonitis.  The patient is currently receiving physical therapy for her tendonitis.  Haley Shah also states that she has vaginal dryness.  The patient denies any other symptomatology.  The patient is establishing herself with Dr. Milta Deiters service today.  PAST MEDICAL HISTORY: Past Medical History  Diagnosis Date  . Cancer   . Dyspnea   . GERD (gastroesophageal  reflux disease)   . History of thyroid cancer   . IBS (irritable bowel syndrome)   . Sciatica     RLE    PAST SURGICAL HISTORY: Past Surgical History  Procedure Laterality Date  . Breast lumpectomy  2010    LEFT BREAST  . Thyroid surgery  1986    FAMILY HISTORY: Family History  Problem Relation Age of Onset  . Colon cancer Father     SOCIAL HISTORY: History  Substance Use Topics  . Smoking status: Never Smoker   . Smokeless tobacco: Never Used  . Alcohol Use: No    ALLERGIES: Allergies  Allergen Reactions  . Neomycin-Bacitracin Zn-Polymyx     REACTION: rash  . Propoxyphene Hcl     REACTION: syncope  . Darvon Other (See Comments)    Almost fainted when taking, over 50 years.    MEDICATIONS:  Current Outpatient Prescriptions  Medication Sig Dispense Refill  . anastrozole (ARIMIDEX) 1 MG tablet Take 1 tablet (1 mg total) by mouth daily.  90 tablet  4  . diphenhydrAMINE (BENADRYL) 25 MG tablet Take 25 mg by mouth as needed for itching.      . Levothyroxine Sodium (LEVOXYL PO) Take 1 mcg by mouth daily. Takes medication M - F only.      . pravastatin (PRAVACHOL) 40 MG tablet Take 40 mg by mouth daily.       Marland Kitchen VITAMIN D, CHOLECALCIFEROL, PO Take 2,000 Int'l Units by mouth.       . Wheat Dextrin (BENEFIBER) POWD Take 2 scoop by mouth daily.  No current facility-administered medications for this visit.      REVIEW OF SYSTEMS: A 10 point review of systems was completed and is negative except as noted above.    PHYSICAL EXAMINATION: BP 150/81  Pulse 60  Temp(Src) 98 F (36.7 C) (Oral)  Resp 20  Ht 5\' 1"  (1.549 m)  Wt 126 lb 6.4 oz (57.335 kg)  BMI 23.9 kg/m2, O2 98% on RA  General appearance: Alert, cooperative, well nourished, no apparent distress Head: Normocephalic, without obvious abnormality, atraumatic Eyes: Arcus senilis, PERRLA, EOMI Nose: Nares, septum and mucosa are normal, no drainage or sinus tenderness Neck: No adenopathy, supple,  symmetrical, trachea midline, thyroid not enlarged, no tenderness Resp: Faint expiratory wheezing in right upper lobe and left upper lobe Cardio: Regular rate and rhythm, S1, S2 normal, no murmur, click, rub or gallop Breasts: Left breast has architectural and radiation changes noted, left breast has well-healed surgical scars, bilateral firm inframammary ridge,  no lymphadenopathy, no nipple inversion, no axilla fullness GI: Soft, not distended, non-tender, hypoactive bowel sounds, no organomegaly Extremities: Extremities normal, atraumatic, no cyanosis or edema, limited left lower extremity range of motion Lymph nodes: Cervical, supraclavicular, and axillary nodes normal Neurologic: Grossly normal    ECOG FS:  Grade 1 - Symptomatic but completely ambulatory           LAB RESULTS: Lab Results  Component Value Date   WBC 5.6 06/26/2011   NEUTROABS 3.1 06/26/2011   HGB 12.9 06/26/2011   HCT 37.8 06/26/2011   MCV 91.0 06/26/2011   PLT 219 06/26/2011      Chemistry      Component Value Date/Time   NA 141 06/26/2011 0942   K 4.2 06/26/2011 0942   CL 107 06/26/2011 0942   CO2 28 06/26/2011 0942   BUN 14 06/26/2011 0942   CREATININE 0.87 06/26/2011 0942      Component Value Date/Time   CALCIUM 9.0 06/26/2011 0942   ALKPHOS 46 06/26/2011 0942   AST 14 06/26/2011 0942   ALT 8 06/26/2011 0942   BILITOT 0.3 06/26/2011 0942       Lab Results  Component Value Date   LABCA2 25 06/26/2011     RADIOGRAPHIC STUDIES: No results found.  ASSESSMENT: 72 y.o. [place], [place] woman: 1.  Status post left breasts needle core biopsy on 11/07/2008 which showed a 3 mm invasive ductal carcinoma with DCIS and calcifications/necrosis, stage 1, T1a, N0,  ER 100%, PR 0%, Ki-67 10%, HER2/neu negative by CISH with a ratio 0.87.   Status post left press lumpectomy with sentinel node excision and re-excision of medial margin on 11/23/2008, which showed no residual invasive carcinoma identified, pTX,  pN0, ER 100%, PR 0%, Ki-67 10%, HER2/neu by CISH no amplification, 0/3 positive lymph nodes. Status post radiation therapy from 01/05/2009 until 02/17/2009.  The patient started antiestrogen therapy with Tamoxifen from 02/2009 through 06/2011.  The patient then switched antiestrogen therapy to Arimidex in 06/2011 until the present time.  2.  Vaginal dryness  3.  Osteopenia  PLAN: 1.  Haley Shah is scheduled to continue antiestrogen therapy with Arimidex until 02/2014.  An electronic prescription for Arimidex 1 mg PO daily #90 with 4 refills was sent to the patient's pharmacy.  The patient's last mammogram on 12/12/2011 showed no new or worrisome findings.  She will be due for a bilateral digital diagnostic mammogram in 11/2012, and we will schedule the mammogram for her.  2. Over-the-counter remedies for vaginal dryness was discussed  with the patient.  3.  The patient will be scheduled for a bone density scan.  She was encouraged to take calcium 600 mg daily in addition to vitamin D3 1000 IUs daily with regards to her osteopenia.  4.  We plan to see Haley Shah again in one year at which time we will check a CBC, CMP, LDH, and vitamin D level.  She had laboratories drawn recently at her primary care physician's office and will forward the results to [place].  All questions were answered.  The patient was encouraged to contact us in the interim with any problems, questions or concerns.    Larina Bras, NP-C 07/18/2012, 11:46 PM

## 2012-08-04 ENCOUNTER — Other Ambulatory Visit: Payer: Self-pay | Admitting: Dermatology

## 2012-11-03 ENCOUNTER — Ambulatory Visit (INDEPENDENT_AMBULATORY_CARE_PROVIDER_SITE_OTHER): Payer: Medicare Other | Admitting: General Surgery

## 2012-11-03 ENCOUNTER — Encounter (INDEPENDENT_AMBULATORY_CARE_PROVIDER_SITE_OTHER): Payer: Self-pay | Admitting: General Surgery

## 2012-11-03 VITALS — BP 148/76 | HR 70 | Resp 14 | Ht 62.0 in | Wt 127.4 lb

## 2012-11-03 DIAGNOSIS — C50919 Malignant neoplasm of unspecified site of unspecified female breast: Secondary | ICD-10-CM

## 2012-11-03 DIAGNOSIS — Z853 Personal history of malignant neoplasm of breast: Secondary | ICD-10-CM | POA: Insufficient documentation

## 2012-11-03 DIAGNOSIS — C50912 Malignant neoplasm of unspecified site of left female breast: Secondary | ICD-10-CM

## 2012-11-03 NOTE — Progress Notes (Signed)
Patient ID: Haley Shah, female   DOB: Jul 11, 1940, 72 y.o.   MRN: [id number] History: This patient returns for long-term followup of her left breast cancer. On 11/23/2000 she underwent left partial mastectomy and sentinel node biopsy for invasive and in situ cancer of the left breast, laterally. Stage TI B., N0, ER positive, PR-negative, HER-2-negative. Margins were reexcised and there was no residual cancer at the margins she had radiation therapy did well with that. She takes arimidex. She is now being followed by Dr. Drue Second. She has no complaints about her breast. Last mammograms were 12/12/2011, BI-RADS category 2. She is scheduled for mammogram next month.  ROS: 10 system review of systems negative except as described above.  Exam: Patient looks well. No distress Neck: No adenopathy or mass Breasts left breast reveals radially oriented scar at the 3:30 position and left axillary scar. All scars well-healed. No palpable masses in either breast. No axillary adenopathy Lungs: Clear to auscultation bilateral Heart: Regular rate and rhythm. No murmur. No ectopy  Assessment invasive and in situ cancer left breast, upper outer quadrant, stage TI B. N0, receptor positive HER-2 negative. No evidence of recurrence 4 years following left partial mastectomy and sentinel node biopsy and reexcision of margins  Plan: Mammograms in September 2014 Mammograms in September 2015 Return to see me in October 2015 Continued followup Dr. Drue Second.   Angelia Mould. Derrell Lolling, M.D., [facility] General and Minimally invasive Surgery Breast and Colorectal Surgery Office:   [phone] Pager:   [phone]

## 2012-11-03 NOTE — Patient Instructions (Signed)
Your physical exam today she shows no evidence of cancer.  Continue to take the arimidex and keep your followup appointments with Dr. Drue Second  Be sure to get  mammograms next month, September.  Return to see Dr. Derrell Lolling in October 2015 after mammograms are done.

## 2013-01-25 ENCOUNTER — Encounter: Payer: Self-pay | Admitting: Internal Medicine

## 2013-01-26 ENCOUNTER — Encounter: Payer: Self-pay | Admitting: Emergency Medicine

## 2013-01-26 ENCOUNTER — Ambulatory Visit: Payer: Self-pay | Admitting: Emergency Medicine

## 2013-01-26 VITALS — BP 120/72 | HR 64 | Temp 98.4°F | Resp 16 | Ht 62.0 in | Wt 132.0 lb

## 2013-01-26 DIAGNOSIS — E782 Mixed hyperlipidemia: Secondary | ICD-10-CM

## 2013-01-26 DIAGNOSIS — E559 Vitamin D deficiency, unspecified: Secondary | ICD-10-CM

## 2013-01-26 DIAGNOSIS — E039 Hypothyroidism, unspecified: Secondary | ICD-10-CM

## 2013-01-26 DIAGNOSIS — M79609 Pain in unspecified limb: Secondary | ICD-10-CM

## 2013-01-26 DIAGNOSIS — I10 Essential (primary) hypertension: Secondary | ICD-10-CM

## 2013-01-26 LAB — CBC WITH DIFFERENTIAL/PLATELET
Basophils Absolute: 0.1 10*3/uL (ref 0.0–0.1)
Eosinophils Relative: 1 % (ref 0–5)
Lymphocytes Relative: 26 % (ref 12–46)
Lymphs Abs: 1.8 10*3/uL (ref 0.7–4.0)
MCV: 88.1 fL (ref 78.0–100.0)
Neutro Abs: 4.1 10*3/uL (ref 1.7–7.7)
Neutrophils Relative %: 62 % (ref 43–77)
Platelets: 262 10*3/uL (ref 150–400)
RBC: 4.62 MIL/uL (ref 3.87–5.11)
RDW: 14.6 % (ref 11.5–15.5)
WBC: 6.7 10*3/uL (ref 4.0–10.5)

## 2013-01-26 LAB — BASIC METABOLIC PANEL WITH GFR
CO2: 29 mEq/L (ref 19–32)
Chloride: 102 mEq/L (ref 96–112)
Creat: 0.89 mg/dL (ref 0.50–1.10)
GFR, Est Non African American: 65 mL/min
Sodium: 141 mEq/L (ref 135–145)

## 2013-01-26 LAB — LIPID PANEL
HDL: 52 mg/dL (ref >39)
LDL Cholesterol: 152 mg/dL — ABNORMAL HIGH (ref 0–99)
Total CHOL/HDL Ratio: 4.5 Ratio
Triglycerides: 146 mg/dL (ref <150)
VLDL: 29 mg/dL (ref 0–40)

## 2013-01-26 LAB — TSH: TSH: 1.344 u[IU]/mL (ref 0.350–4.500)

## 2013-01-26 LAB — HEPATIC FUNCTION PANEL
Albumin: 4.4 g/dL (ref 3.5–5.2)
Alkaline Phosphatase: 80 U/L (ref 39–117)
Indirect Bilirubin: 0.4 mg/dL (ref 0.0–0.9)
Total Bilirubin: 0.5 mg/dL (ref 0.3–1.2)
Total Protein: 7 g/dL (ref 6.0–8.3)

## 2013-01-26 LAB — CK: Total CK: 62 U/L (ref 7–177)

## 2013-01-26 NOTE — Patient Instructions (Signed)
Musculoskeletal Pain Musculoskeletal pain is muscle and boney aches and pains. These pains can occur in any part of the body. Your caregiver may treat you without knowing the cause of the pain. They may treat you if blood or urine tests, X-rays, and other tests were normal.  CAUSES There is often not a definite cause or reason for these pains. These pains may be caused by a type of germ (virus). The discomfort may also come from overuse. Overuse includes working out too hard when your body is not fit. Boney aches also come from weather changes. Bone is sensitive to atmospheric pressure changes. HOME CARE INSTRUCTIONS   Ask when your test results will be ready. Make sure you get your test results.  Only take over-the-counter or prescription medicines for pain, discomfort, or fever as directed by your caregiver. If you were given medications for your condition, do not drive, operate machinery or power tools, or sign legal documents for 24 hours. Do not drink alcohol. Do not take sleeping pills or other medications that may interfere with treatment.  Continue all activities unless the activities cause more pain. When the pain lessens, slowly resume normal activities. Gradually increase the intensity and duration of the activities or exercise.  During periods of severe pain, bed rest may be helpful. Lay or sit in any position that is comfortable.  Putting ice on the injured area.  Put ice in a bag.  Place a towel between your skin and the bag.  Leave the ice on for 15 to 20 minutes, 3 to 4 times a day.  Follow up with your caregiver for continued problems and no reason can be found for the pain. If the pain becomes worse or does not go away, it may be necessary to repeat tests or do additional testing. Your caregiver may need to look further for a possible cause. SEEK IMMEDIATE MEDICAL CARE IF:  You have pain that is getting worse and is not relieved by medications.  You develop chest pain  that is associated with shortness or breath, sweating, feeling sick to your stomach (nauseous), or throw up (vomit).  Your pain becomes localized to the abdomen.  You develop any new symptoms that seem different or that concern you. MAKE SURE YOU:   Understand these instructions.  Will watch your condition.  Will get help right away if you are not doing well or get worse. Document Released: 03/04/2005 Document Revised: 05/27/2011 Document Reviewed: 10/23/2007 [facility] Patient Information 2014 [place], [place]. Cholesterol Cholesterol is a type of fat. Your body needs a small amount of cholesterol, but too much can cause health problems. Certain problems include heart attacks, strokes, and not enough blood flow to your heart, brain, kidneys, or feet. You get cholesterol in 2 ways:  Naturally.  By eating certain foods. HOME CARE  Eat a low-fat diet:  Eat less eggs, whole dairy products (whole milk, cheese, and butter), fatty meats, and fried foods.  Eat more fruits, vegetables, whole-wheat breads, lean chicken, and fish.  Follow your exercise program as told by your doctor.  Keep your weight at a healthy level. Talk to your doctor about what is right for you.  Only take medicine as told by your doctor.  Get your cholesterol checked once a year or as told by your doctor. MAKE SURE YOU:  Understand these instructions.  Will watch your condition.  Will get help right away if you are not doing well or get worse. Document Released: 05/31/2008 Document Revised: 05/27/2011  Document Reviewed: 05/31/2008 [facility] Patient Information 2014 [place], [place].

## 2013-01-26 NOTE — Progress Notes (Signed)
  Subjective:    Patient ID: Haley Shah, female    DOB: Mar 26, 1940, 72 y.o.   MRN: [id number]  HPI Comments: 72 yo female presents for 3 month F/U for HTN- labile, Cholesterol, D. Deficient, Hypothyroid. She D/C pravastatin x 5 days due to myalgias, which have improved with discontinuation. She has been eating healthy and exercising regularly. Wants to switch to Branded Synthroid when current generic RX expires, due to cost difference with better coverage for branded RX. LAST LABS T 189 TG 129 H 50 L113. Changed levoxyl at last lab without any SE.   Hypertension  Hyperlipidemia Associated symptoms include myalgias.  Gastrophageal Reflux    Current Outpatient Prescriptions on File Prior to Visit  Medication Sig Dispense Refill  . anastrozole (ARIMIDEX) 1 MG tablet Take 1 tablet (1 mg total) by mouth daily.  90 tablet  4  . diphenhydrAMINE (BENADRYL) 25 MG tablet Take 25 mg by mouth as needed for itching.      . Levothyroxine Sodium (LEVOXYL PO) Take 100 mcg by mouth daily. Takes medication M - F only.      Marland Kitchen VITAMIN D, CHOLECALCIFEROL, PO Take 2,000 Int'l Units by mouth.       . Wheat Dextrin (BENEFIBER) POWD Take 2 scoop by mouth daily.       No current facility-administered medications on file prior to visit.   ALLERGIES Neomycin-bacitracin zn-polymyx; Pravastatin; Propoxyphene hcl; and Darvon  Past Medical History  Diagnosis Date  . Dyspnea   . GERD (gastroesophageal reflux disease)   . History of thyroid cancer   . IBS (irritable bowel syndrome)   . Sciatica     RLE  . Hypertension   . Hyperlipidemia   . Thyroid disease     Hypo  . Cancer 2010    Left Breast  . Vitamin D deficiency   . Pre-diabetes   . Allergy      Review of Systems  Musculoskeletal: Positive for myalgias.  All other systems reviewed and are negative.       Objective:   Physical Exam  Nursing note and vitals reviewed. Constitutional: She is oriented to person, place, and time. She  appears well-developed and well-nourished.  HENT:  Head: Normocephalic and atraumatic.  Right Ear: External ear normal.  Left Ear: External ear normal.  Mouth/Throat: Oropharynx is clear and moist.  Eyes: Conjunctivae and EOM are normal. Pupils are equal, round, and reactive to light.  Neck: Normal range of motion. Neck supple.  Cardiovascular: Normal rate, regular rhythm, normal heart sounds and intact distal pulses.   Pulmonary/Chest: Effort normal and breath sounds normal.  Abdominal: Soft.  Musculoskeletal: Normal range of motion.  Neurological: She is alert and oriented to person, place, and time.  Skin: Skin is warm and dry.  Psychiatric: She has a normal mood and affect. Her behavior is normal. Judgment and thought content normal.          Assessment & Plan:   3 month F/U for labile HTN, Cholesterol, D. Deficient, Hypothyroid and myalgias. Check labs. Advised of risks of SE with switching thyroid medications w/c if desires.

## 2013-01-28 LAB — ALDOLASE: Aldolase: 5.8 U/L (ref <=8.1)

## 2013-02-04 ENCOUNTER — Telehealth: Payer: Self-pay | Admitting: Internal Medicine

## 2013-02-04 ENCOUNTER — Encounter (HOSPITAL_COMMUNITY): Payer: Self-pay | Admitting: Emergency Medicine

## 2013-02-04 ENCOUNTER — Emergency Department (INDEPENDENT_AMBULATORY_CARE_PROVIDER_SITE_OTHER)
Admission: EM | Admit: 2013-02-04 | Discharge: 2013-02-04 | Disposition: A | Discharge status: Home / Self Care | Payer: Medicare Other | Source: Home / Self Care

## 2013-02-04 DIAGNOSIS — R1031 Right lower quadrant pain: Secondary | ICD-10-CM

## 2013-02-04 DIAGNOSIS — R195 Other fecal abnormalities: Secondary | ICD-10-CM

## 2013-02-04 DIAGNOSIS — R109 Unspecified abdominal pain: Secondary | ICD-10-CM

## 2013-02-04 NOTE — ED Notes (Signed)
Abdominal pain for 2 days described as cramping.  No nausea, no vomiting, no diarrhea.  Noted blood on tissue paper yesterday.  Today reports stool is darker than usual and cramping is now soreness.  Has an appt with dr Kinnie Scales in am at 10:00am.  pcp dr Nelva Nay [place] office suggested patient come to [facility] for evaluation

## 2013-02-04 NOTE — Telephone Encounter (Signed)
PT CALLED COMPLAINING OF STOMACH PAIN AND CRAMPING FOR THE LAST 3 DAYS, ALSO STATED STOOLS ARE BLACK AND STOMACH SORE.  ADVISED ME THAT SHE HAD AN APPOINTMENT WITH DR MEDOFF,G.I. THE NEXT DAY, Friday November 21,2014.  DR Oneta Rack ADVISED THE PT TO GO TO THE [facility] TO HAVE AN EVALUATION AND LABS TODAY.

## 2013-02-04 NOTE — ED Provider Notes (Signed)
CSN: [id number]     Arrival date & time 02/04/13  1129 History   First MD Initiated Contact with Patient 02/04/13 1249     Chief Complaint  Patient presents with  . Abdominal Pain   (Consider location/radiation/quality/duration/timing/severity/associated sxs/prior Treatment) HPI Comments: 72 year old well-preserved female developed lower, cramping 3 days ago. The cramps were much better yesterday but last night she developed some soreness in the abdomen in mild cramping today. Yesterday he she saw a tinge of blood when wiping after a BM. She notes that she has had a tarry black stool. She has been taking Pepto-Bismol tablets.   Past Medical History  Diagnosis Date  . Dyspnea   . GERD (gastroesophageal reflux disease)   . History of thyroid cancer   . IBS (irritable bowel syndrome)   . Sciatica     RLE  . Hypertension   . Hyperlipidemia   . Thyroid disease     Hypo  . Cancer 2010    Left Breast  . Vitamin D deficiency   . Pre-diabetes   . Allergy    Past Surgical History  Procedure Laterality Date  . Thyroid surgery  1986  . Breast lumpectomy  2010    LEFT BREAST   Family History  Problem Relation Age of Onset  . Colon cancer Father   . Stroke Father   . Cancer Father     Pancreatic   History  Substance Use Topics  . Smoking status: Never Smoker   . Smokeless tobacco: Never Used  . Alcohol Use: No   OB History   Grav Para Term Preterm Abortions TAB SAB Ect Mult Living                 Review of Systems  Gastrointestinal: Positive for abdominal pain and blood in stool. Negative for nausea, vomiting, diarrhea, constipation, abdominal distention and rectal pain.  Genitourinary: Negative for dysuria, hematuria, difficulty urinating and pelvic pain.  Neurological: Negative.   All other systems reviewed and are negative.    Allergies  Neomycin-bacitracin zn-polymyx; Pravastatin; Propoxyphene hcl; and Darvon  Home Medications   Current Outpatient Rx  Name   Route  Sig  Dispense  Refill  . anastrozole (ARIMIDEX) 1 MG tablet   Oral   Take 1 tablet (1 mg total) by mouth daily.   90 tablet   4   . diphenhydrAMINE (BENADRYL) 25 MG tablet   Oral   Take 25 mg by mouth as needed for itching.         . Levothyroxine Sodium (LEVOXYL PO)   Oral   Take 100 mcg by mouth daily. Takes medication M - F only.         Marland Kitchen VITAMIN D, CHOLECALCIFEROL, PO   Oral   Take 2,000 Int'l Units by mouth.          . Wheat Dextrin (BENEFIBER) POWD   Oral   Take 2 scoop by mouth daily.          BP 159/74  Pulse 74  Temp(Src) 98.4 F (36.9 C) (Oral)  Resp 16  SpO2 98% Physical Exam  Nursing note and vitals reviewed. Constitutional: She is oriented to person, place, and time. She appears well-developed and well-nourished. No distress.  Neck: Normal range of motion. Neck supple.  Cardiovascular: Normal rate, regular rhythm and normal heart sounds.   Pulmonary/Chest: Effort normal and breath sounds normal. No respiratory distress. She has no wheezes.  Abdominal: Soft. Bowel sounds are normal. She exhibits no  distension and no mass. There is no rebound and no guarding.  Minimal tenderness to deep palpation in both right and left lower quadrants. Although somewhat equivocal worse on the left than the right.  Genitourinary: Guaiac negative stool.  No rectal tenderness. Normal sphincter, there are stool in the rectal vault. Guaiac test negative.  Neurological: She is alert and oriented to person, place, and time. She exhibits normal muscle tone.  Skin: Skin is warm and dry.  Psychiatric: She has a normal mood and affect.    ED Course  Procedures (including critical care time) Labs Review Labs Reviewed - No data to display Imaging Review No results found.    MDM   1. Dark stools   2. Abdominal cramping, bilateral lower quadrant     Discharge home. Differential would include irritable bowel syndrome symptoms, diverticulitis (although, she has  never been diagnosed). She also recently restarted her pravastatin after being off of it for 2 and half weeks. Cramping is a side effect of this medication. Pt is stable, no signs of acute abd/peritoneal signs.  Guiac with dark brown to black stool guiac negative.  Keep appointment with Dr. Kinnie Scales tomorrow AM.  Pt is stable , no pain or other GI sx's now.  No more Pepto Bismol.  Hayden Rasmussen, NP 02/04/13 1316

## 2013-02-06 NOTE — ED Provider Notes (Signed)
Medical screening examination/treatment/procedure(s) were performed by a resident physician or non-physician practitioner and as the supervising physician I was immediately available for consultation/collaboration.  Clementeen Graham, MD    Rodolph Bong, MD 02/06/13 [phone]

## 2013-03-02 ENCOUNTER — Other Ambulatory Visit: Payer: Self-pay

## 2013-03-02 MED ORDER — LEVOTHYROXINE SODIUM 100 MCG PO TABS: 100.0000 ug | ORAL_TABLET | Freq: Every day | ORAL | Status: DC

## 2013-04-05 ENCOUNTER — Ambulatory Visit (INDEPENDENT_AMBULATORY_CARE_PROVIDER_SITE_OTHER): Payer: Medicare HMO | Admitting: Physician Assistant

## 2013-04-05 ENCOUNTER — Encounter: Payer: Self-pay | Admitting: Physician Assistant

## 2013-04-05 VITALS — BP 138/72 | HR 84 | Temp 97.9°F | Resp 16 | Ht 61.0 in | Wt 127.0 lb

## 2013-04-05 DIAGNOSIS — R42 Dizziness and giddiness: Secondary | ICD-10-CM

## 2013-04-05 DIAGNOSIS — N3000 Acute cystitis without hematuria: Secondary | ICD-10-CM

## 2013-04-05 DIAGNOSIS — H8110 Benign paroxysmal vertigo, unspecified ear: Secondary | ICD-10-CM

## 2013-04-05 DIAGNOSIS — R079 Chest pain, unspecified: Secondary | ICD-10-CM

## 2013-04-05 LAB — CBC WITH DIFFERENTIAL/PLATELET
BASOS ABS: 0 10*3/uL (ref 0.0–0.1)
Basophils Relative: 0 % (ref 0–1)
EOS PCT: 0 % (ref 0–5)
Eosinophils Absolute: 0 10*3/uL (ref 0.0–0.7)
HEMATOCRIT: 40.7 % (ref 36.0–46.0)
HEMOGLOBIN: 13.9 g/dL (ref 12.0–15.0)
LYMPHS ABS: 1.4 10*3/uL (ref 0.7–4.0)
LYMPHS PCT: 18 % (ref 12–46)
MCH: 29.9 pg (ref 26.0–34.0)
MCHC: 34.2 g/dL (ref 30.0–36.0)
MCV: 87.5 fL (ref 78.0–100.0)
MONO ABS: 0.6 10*3/uL (ref 0.1–1.0)
MONOS PCT: 8 % (ref 3–12)
NEUTROS ABS: 5.7 10*3/uL (ref 1.7–7.7)
Neutrophils Relative %: 74 % (ref 43–77)
Platelets: 270 10*3/uL (ref 150–400)
RBC: 4.65 MIL/uL (ref 3.87–5.11)
RDW: 14.4 % (ref 11.5–15.5)
WBC: 7.8 10*3/uL (ref 4.0–10.5)

## 2013-04-05 LAB — HEPATIC FUNCTION PANEL
ALBUMIN: 4.6 g/dL (ref 3.5–5.2)
ALK PHOS: 71 U/L (ref 39–117)
ALT: 14 U/L (ref 0–35)
AST: 17 U/L (ref 0–37)
Bilirubin, Direct: 0.1 mg/dL (ref 0.0–0.3)
Indirect Bilirubin: 0.3 mg/dL (ref 0.0–0.9)
TOTAL PROTEIN: 7.1 g/dL (ref 6.0–8.3)
Total Bilirubin: 0.4 mg/dL (ref 0.3–1.2)

## 2013-04-05 LAB — BASIC METABOLIC PANEL WITH GFR
BUN: 12 mg/dL (ref 6–23)
CO2: 29 meq/L (ref 19–32)
CREATININE: 0.85 mg/dL (ref 0.50–1.10)
Calcium: 10.1 mg/dL (ref 8.4–10.5)
Chloride: 102 mEq/L (ref 96–112)
GFR, Est African American: 78 mL/min
GFR, Est Non African American: 68 mL/min
GLUCOSE: 85 mg/dL (ref 70–99)
POTASSIUM: 4.5 meq/L (ref 3.5–5.3)
Sodium: 140 mEq/L (ref 135–145)

## 2013-04-05 LAB — MAGNESIUM: Magnesium: 2.2 mg/dL (ref 1.5–2.5)

## 2013-04-05 MED ORDER — DIAZEPAM 5 MG PO TABS: ORAL_TABLET | ORAL | Status: DC

## 2013-04-05 NOTE — Patient Instructions (Signed)
Vertigo  Vertigo means you feel like you are moving when you are not. Vertigo can make you feel like things around you are moving when they are not. This problem often goes away on its own.   HOME CARE   · Follow your doctor's instructions.  · Avoid driving.  · Avoid using heavy machinery.  · Avoid doing any activity that could be dangerous if you have a vertigo attack.  · Tell your doctor if a medicine seems to cause your vertigo.  GET HELP RIGHT AWAY IF:   · Your medicines do not help or make you feel worse.  · You have trouble talking or walking.  · You feel weak or have trouble using your arms, hands, or legs.  · You have bad headaches.  · You keep feeling sick to your stomach (nauseous) or throwing up (vomiting).  · Your vision changes.  · A family member notices changes in your behavior.  · Your problems get worse.  MAKE SURE YOU:  · Understand these instructions.  · Will watch your condition.  · Will get help right away if you are not doing well or get worse.  Document Released: 12/12/2007 Document Revised: 05/27/2011 Document Reviewed: 09/20/2010  [employer]® Patient Information ©2014 [employer].

## 2013-04-05 NOTE — Progress Notes (Signed)
Subjective:     Haley Shah is a 73 y.o. female who presents for evaluation of dizziness. The symptoms started 1 week ago and are ongoing. The attacks occur several times per week and last 1 seconds. Positions that worsen symptoms: head back and turning head. Previous workup/treatments: none. Associated ear symptoms: none. Associated CNS symptoms: [place] and felt dizzy with a wavey line in her vision, eye check 2 months ago normal. . Recent infections: none. Head trauma: denied. Drug ingestion: none. Noise exposure: no occupational exposure. Family history: non-contributory.She has been feeling like she has been having palpitations, some "twinges" left side of heart, no SOB. She also states she has felt very anxious recently.   The following portions of the patient's history were reviewed and updated as appropriate: allergies, current medications, past family history, past medical history, past social history, past surgical history and problem list.  Review of Systems Pertinent items are noted in HPI.    Objective:    BP 138/72  Pulse 84  Temp(Src) 97.9 F (36.6 C)  Resp 16  Ht 5\' 1"  (1.549 m)  Wt 127 lb (57.607 kg)  BMI 24.01 kg/m2 General appearance: alert Head: Normocephalic, without obvious abnormality, atraumatic, no bruits Eyes: conjunctivae/corneas clear. PERRL, EOM's intact. Fundi benign. Ears: normal TM's and external ear canals both ears and Bilateral effusions Lungs: clear to auscultation bilaterally Heart: irregularly irregular rhythm and S1, S2 normal, possible PVCs Abdomen: soft, non-tender; bowel sounds normal; no masses,  no organomegaly Pulses: 2+ and symmetric Neurologic: Alert and oriented X 3, normal strength and tone. Normal symmetric reflexes. Normal coordination and gait Cranial nerves: normal      + DixHallpike to the left.  Assessment:    Arrhythmia, Benign positional vertigo and Meniere's Disease    Plan:    Vertigo- EKG  shows no ST changes with frequent PVC, may need a holter monitor other wise check labs to rule out electrolyte abnormality, dehydration, infection ETC.  Patient states that meclizine makes her feel bad, we will do valium for vertigo and anxiety.  The nature of vertigo syndromes were discussed along with their usual course and treatment. Get someone else to drive for several days.  Educational materials given and questions answered.   If she has any changes in vision, weakness, changes in speech please go to ER.

## 2013-04-06 LAB — URINALYSIS, MICROSCOPIC ONLY
BACTERIA UA: NONE SEEN
CRYSTALS: NONE SEEN
Casts: NONE SEEN
Squamous Epithelial / LPF: NONE SEEN

## 2013-04-06 LAB — URINALYSIS, ROUTINE W REFLEX MICROSCOPIC
BILIRUBIN URINE: NEGATIVE
Glucose, UA: NEGATIVE mg/dL
Ketones, ur: NEGATIVE mg/dL
Leukocytes, UA: NEGATIVE
Nitrite: NEGATIVE
Protein, ur: NEGATIVE mg/dL
SPECIFIC GRAVITY, URINE: 1.013 (ref 1.005–1.030)
Urobilinogen, UA: 0.2 mg/dL (ref 0.0–1.0)
pH: 6 (ref 5.0–8.0)

## 2013-04-06 LAB — TSH: TSH: 1.197 u[IU]/mL (ref 0.350–4.500)

## 2013-04-07 LAB — URINE CULTURE
COLONY COUNT: NO GROWTH
ORGANISM ID, BACTERIA: NO GROWTH

## 2013-04-13 ENCOUNTER — Ambulatory Visit: Payer: Self-pay | Admitting: Physician Assistant

## 2013-04-13 ENCOUNTER — Ambulatory Visit (INDEPENDENT_AMBULATORY_CARE_PROVIDER_SITE_OTHER): Payer: Medicare HMO

## 2013-04-13 DIAGNOSIS — R9431 Abnormal electrocardiogram [ECG] [EKG]: Secondary | ICD-10-CM

## 2013-04-13 NOTE — Progress Notes (Signed)
Patient ID: Haley Shah, female   DOB: [birth year], 73 y.o.   MRN: [id number] Patient here today for a follow up to abnormal EKG.

## 2013-04-20 ENCOUNTER — Other Ambulatory Visit: Payer: Self-pay | Admitting: Physician Assistant

## 2013-04-20 ENCOUNTER — Ambulatory Visit: Payer: Self-pay | Admitting: Physician Assistant

## 2013-04-20 MED ORDER — LEVOTHYROXINE SODIUM 100 MCG PO TABS: 100.0000 ug | ORAL_TABLET | Freq: Every day | ORAL | Status: DC

## 2013-04-20 MED ORDER — PRAVASTATIN SODIUM 40 MG PO TABS: 40.0000 mg | ORAL_TABLET | Freq: Every day | ORAL | Status: DC

## 2013-04-22 DIAGNOSIS — Z87898 Personal history of other specified conditions: Secondary | ICD-10-CM | POA: Insufficient documentation

## 2013-04-22 DIAGNOSIS — R7309 Other abnormal glucose: Secondary | ICD-10-CM | POA: Insufficient documentation

## 2013-04-22 DIAGNOSIS — E559 Vitamin D deficiency, unspecified: Secondary | ICD-10-CM | POA: Insufficient documentation

## 2013-04-23 ENCOUNTER — Encounter: Payer: Self-pay | Admitting: Internal Medicine

## 2013-04-23 ENCOUNTER — Ambulatory Visit (INDEPENDENT_AMBULATORY_CARE_PROVIDER_SITE_OTHER): Payer: Medicare HMO | Admitting: Internal Medicine

## 2013-04-23 VITALS — BP 136/60 | HR 68 | Temp 97.5°F | Resp 16 | Ht 61.5 in | Wt 127.6 lb

## 2013-04-23 DIAGNOSIS — E559 Vitamin D deficiency, unspecified: Secondary | ICD-10-CM

## 2013-04-23 DIAGNOSIS — Z0000 Encounter for general adult medical examination without abnormal findings: Secondary | ICD-10-CM

## 2013-04-23 DIAGNOSIS — I10 Essential (primary) hypertension: Secondary | ICD-10-CM

## 2013-04-23 DIAGNOSIS — Z1212 Encounter for screening for malignant neoplasm of rectum: Secondary | ICD-10-CM

## 2013-04-23 DIAGNOSIS — E785 Hyperlipidemia, unspecified: Secondary | ICD-10-CM

## 2013-04-23 DIAGNOSIS — R7303 Prediabetes: Secondary | ICD-10-CM

## 2013-04-23 DIAGNOSIS — Z79899 Other long term (current) drug therapy: Secondary | ICD-10-CM

## 2013-04-23 LAB — HEPATIC FUNCTION PANEL
ALT: 13 U/L (ref 0–35)
AST: 19 U/L (ref 0–37)
Albumin: 4.9 g/dL (ref 3.5–5.2)
Alkaline Phosphatase: 73 U/L (ref 39–117)
BILIRUBIN DIRECT: 0.1 mg/dL (ref 0.0–0.3)
BILIRUBIN TOTAL: 0.4 mg/dL (ref 0.2–1.2)
Indirect Bilirubin: 0.3 mg/dL (ref 0.2–1.2)
Total Protein: 7.4 g/dL (ref 6.0–8.3)

## 2013-04-23 LAB — BASIC METABOLIC PANEL WITH GFR
BUN: 20 mg/dL (ref 6–23)
CO2: 26 meq/L (ref 19–32)
Calcium: 9.6 mg/dL (ref 8.4–10.5)
Chloride: 103 mEq/L (ref 96–112)
Creat: 0.91 mg/dL (ref 0.50–1.10)
GFR, EST AFRICAN AMERICAN: 72 mL/min
GFR, Est Non African American: 62 mL/min
GLUCOSE: 95 mg/dL (ref 70–99)
POTASSIUM: 4.2 meq/L (ref 3.5–5.3)
SODIUM: 141 meq/L (ref 135–145)

## 2013-04-23 LAB — CBC WITH DIFFERENTIAL/PLATELET
Basophils Absolute: 0 10*3/uL (ref 0.0–0.1)
Basophils Relative: 1 % (ref 0–1)
EOS ABS: 0.1 10*3/uL (ref 0.0–0.7)
Eosinophils Relative: 1 % (ref 0–5)
HCT: 41 % (ref 36.0–46.0)
Hemoglobin: 14 g/dL (ref 12.0–15.0)
LYMPHS ABS: 1 10*3/uL (ref 0.7–4.0)
Lymphocytes Relative: 16 % (ref 12–46)
MCH: 30.5 pg (ref 26.0–34.0)
MCHC: 34.1 g/dL (ref 30.0–36.0)
MCV: 89.3 fL (ref 78.0–100.0)
Monocytes Absolute: 0.7 10*3/uL (ref 0.1–1.0)
Monocytes Relative: 13 % — ABNORMAL HIGH (ref 3–12)
Neutro Abs: 4.1 10*3/uL (ref 1.7–7.7)
Neutrophils Relative %: 69 % (ref 43–77)
Platelets: 240 10*3/uL (ref 150–400)
RBC: 4.59 MIL/uL (ref 3.87–5.11)
RDW: 14.4 % (ref 11.5–15.5)
WBC: 5.9 10*3/uL (ref 4.0–10.5)

## 2013-04-23 LAB — LIPID PANEL
Cholesterol: 227 mg/dL — ABNORMAL HIGH (ref 0–200)
HDL: 58 mg/dL (ref >39)
LDL CALC: 149 mg/dL — AB (ref 0–99)
Total CHOL/HDL Ratio: 3.9 Ratio
Triglycerides: 99 mg/dL (ref <150)
VLDL: 20 mg/dL (ref 0–40)

## 2013-04-23 LAB — MAGNESIUM: Magnesium: 2.1 mg/dL (ref 1.5–2.5)

## 2013-04-23 LAB — TSH: TSH: 0.672 u[IU]/mL (ref 0.350–4.500)

## 2013-04-23 LAB — HEMOGLOBIN A1C
Hgb A1c MFr Bld: 5.6 % (ref <5.7)
Mean Plasma Glucose: 114 mg/dL (ref <117)

## 2013-04-23 NOTE — Patient Instructions (Signed)

## 2013-04-23 NOTE — Progress Notes (Signed)
Patient ID: Haley Shah, female   DOB: 1940-09-19, 73 y.o.   MRN: [id number]  Annual Screening Comprehensive Examination  This very nice 73 y.o. female presents for complete physical.  Patient has been followed for Hx/ Labile HTN, Diabetes  Prediabetes, Hyperlipidemia, and Vitamin D Deficiency.    Patient has remote Hx/o elevated BP and has been monitored expectantly w/o necessity to start Tx. Today's BP: 136/60 mmHg. Patient denies any cardiac symptoms as chest pain, palpitations, shortness of breath, dizziness or ankle swelling.   Patient's hyperlipidemia is controlled with diet and Pravastatin. Patient denies myalgias or other medication SE's. Last cholesterol last visit was 189, triglycerides 129, HDL 50 and LDL 113 - not at goal.     Patient is monitored and screened for prediabetes/insulin resistance with last A1c 5.2% in July 2014. Patient denies reactive hypoglycemic symptoms, visual blurring, diabetic polys, or paresthesias.    Also, patient has Hypothyroidism which has been compensated. Finally, patient has history of Vitamin D Deficiency with last vitamin D 77 in July 2014.       Medication List         anastrozole 1 MG tablet  Commonly known as:  ARIMIDEX  Take 1 tablet (1 mg total) by mouth daily.     BENEFIBER Powd  Take 2 scoop by mouth daily.     levothyroxine 100 MCG tablet  Commonly known as:  LEVOXYL  Take 1 tablet (100 mcg total) by mouth daily.     loratadine 10 MG tablet  Commonly known as:  CLARITIN  Take 10 mg by mouth daily.     MULTIVITAMIN PO  Take by mouth daily.     pravastatin 40 MG tablet  Commonly known as:  PRAVACHOL  Take 1 tablet (40 mg total) by mouth daily.     VITAMIN D (CHOLECALCIFEROL) PO  Take 2,000 Int'l Units by mouth.        Allergies  Allergen Reactions  . Neomycin-Bacitracin Zn-Polymyx     REACTION: rash  . Propoxyphene Hcl     REACTION: syncope  . Darvon Other (See Comments)    Almost fainted when taking, over 50  years.    Past Medical History  Diagnosis Date  . Dyspnea   . GERD (gastroesophageal reflux disease)   . History of thyroid cancer   . IBS (irritable bowel syndrome)   . Sciatica     RLE  . Hypertension   . Hyperlipidemia   . Cancer 2010    Left Breast  . Vitamin D deficiency   . Pre-diabetes   . Allergy   . Thyroid disease     Hypo    Past Surgical History  Procedure Laterality Date  . Thyroid surgery  1986  . Breast lumpectomy  2010    LEFT BREAST    Family History  Problem Relation Age of Onset  . Colon cancer Father   . Stroke Father   . Cancer Father     Pancreatic    History  Substance Use Topics  . Smoking status: Never Smoker   . Smokeless tobacco: Never Used  . Alcohol Use: No    ROS Constitutional: Denies fever, chills, weight loss/gain, headaches, insomnia, fatigue, night sweats, and change in appetite. Eyes: Denies redness, blurred vision, diplopia, discharge, itchy, watery eyes.  ENT: Denies discharge, congestion, post nasal drip, epistaxis, sore throat, earache, hearing loss, dental pain, Tinnitus, Vertigo, Sinus pain, snoring.  Cardio: Denies chest pain, palpitations, irregular heartbeat, syncope, dyspnea, diaphoresis, orthopnea, PND,  claudication, edema Respiratory: denies cough, dyspnea, DOE, pleurisy, hoarseness, laryngitis, wheezing.  Gastrointestinal: Denies dysphagia, heartburn, reflux, water brash, pain, cramps, nausea, vomiting, bloating, diarrhea, constipation, hematemesis, melena, hematochezia, jaundice, hemorrhoids Genitourinary: Denies dysuria, frequency, urgency, nocturia, hesitancy, discharge, hematuria, flank pain Breast:Breast lumps, nipple discharge, bleeding.  Musculoskeletal: Denies arthralgia, myalgia, stiffness, Jt. Swelling, pain, limp, and strain/sprain. Skin: Denies puritis, rash, hives, warts, acne, eczema, changing in skin lesion Neuro: No weakness, tremor, incoordination, spasms, paresthesia, pain Psychiatric: Denies  confusion, memory loss, sensory loss Endocrine: Denies change in weight, skin, hair change, nocturia, and paresthesia, diabetic polys, visual blurring, hyper / hypo glycemic episodes.  Heme/Lymph: No excessive bleeding, bruising, enlarged lymph nodes.  BP: 136/60  Pulse: 68  Temp: 97.5 F (36.4 C)  Resp: 16    Estimated body mass index is 23.72 kg/(m^2) as calculated from the following:   Height as of this encounter: 5' 1.5" (1.562 m).   Weight as of this encounter: 127 lb 9.6 oz (57.879 kg).  Physical Exam General Appearance: Well nourished, in no apparent distress. Eyes: PERRLA, EOMs, conjunctiva no swelling or erythema, normal fundi and vessels. Sinuses: No frontal/maxillary tenderness ENT/Mouth: EACs patent / TMs  nl. Nares clear without erythema, swelling, mucoid exudates. Oral hygiene is good. No erythema, swelling, or exudate. Tongue normal, non-obstructing. Tonsils not swollen or erythematous. Hearing normal.  Neck: Supple, thyroid normal. No bruits, nodes or JVD. Respiratory: Respiratory effort normal.  BS equal and clear bilateral without rales, rhonci, wheezing or stridor. Cardio: Heart sounds are normal with regular rate and rhythm and no murmurs, rubs or gallops. Peripheral pulses are normal and equal bilaterally without edema. No aortic or femoral bruits. Chest: symmetric with normal excursions and percussion. Breasts: Symmetric, without lumps, nipple discharge, retractions, or fibrocystic changes.  Abdomen: Flat, soft, with bowl sounds. Nontender, no guarding, rebound, hernias, masses, or organomegaly.  Lymphatics: Non tender without lymphadenopathy.   Musculoskeletal: Full ROM all peripheral extremities, joint stability, 5/5 strength, and normal gait. Skin: Warm and dry without rashes, lesions, cyanosis, clubbing or  ecchymosis.  Neuro: Cranial nerves intact, reflexes equal bilaterally. Normal muscle tone, no cerebellar symptoms. Sensation intact.  Pysch: Awake and  oriented X 3, normal affect, Insight and Judgment appropriate.   Assessment and Plan  1. Annual Screening Examination 2. Hypertension, labile  3. Hyperlipidemia 4. Pre Diabetes Screening 5. Vitamin D Deficiency 6. Hypothyroidism  Continue prudent diet as discussed, weight control, BP monitoring, regular exercise, and medications. Discussed med's effects and SE's. Screening labs and tests as requested with regular follow-up as recommended.

## 2013-04-24 LAB — VITAMIN D 25 HYDROXY (VIT D DEFICIENCY, FRACTURES): Vit D, 25-Hydroxy: 86 ng/mL (ref 30–89)

## 2013-04-24 LAB — INSULIN, FASTING: Insulin fasting, serum: 8 u[IU]/mL (ref 3–28)

## 2013-04-24 LAB — URINALYSIS, MICROSCOPIC ONLY
Bacteria, UA: NONE SEEN
CASTS: NONE SEEN
CRYSTALS: NONE SEEN
Squamous Epithelial / LPF: NONE SEEN

## 2013-04-24 LAB — MICROALBUMIN / CREATININE URINE RATIO
Creatinine, Urine: 30.6 mg/dL
MICROALB UR: 0.5 mg/dL (ref 0.00–1.89)
Microalb Creat Ratio: 16.3 mg/g (ref 0.0–30.0)

## 2013-04-25 ENCOUNTER — Other Ambulatory Visit: Payer: Self-pay | Admitting: Internal Medicine

## 2013-04-25 DIAGNOSIS — E782 Mixed hyperlipidemia: Secondary | ICD-10-CM

## 2013-04-25 MED ORDER — ATORVASTATIN CALCIUM 80 MG PO TABS
ORAL_TABLET | ORAL | Status: DC
Start: 2013-04-25 — End: 2013-06-22

## 2013-04-28 ENCOUNTER — Encounter: Payer: Self-pay | Admitting: Internal Medicine

## 2013-05-21 ENCOUNTER — Other Ambulatory Visit (INDEPENDENT_AMBULATORY_CARE_PROVIDER_SITE_OTHER): Payer: Medicare HMO

## 2013-05-21 DIAGNOSIS — Z1212 Encounter for screening for malignant neoplasm of rectum: Secondary | ICD-10-CM

## 2013-05-21 LAB — POC HEMOCCULT BLD/STL (HOME/3-CARD/SCREEN)
Card #2 Fecal Occult Blod, POC: NEGATIVE
Card #3 Fecal Occult Blood, POC: NEGATIVE
Fecal Occult Blood, POC: NEGATIVE

## 2013-06-10 ENCOUNTER — Telehealth: Payer: Self-pay

## 2013-06-10 NOTE — Telephone Encounter (Signed)
Pt requested refill on Arimidex - only has 4 pills left.  Next appt 07/15/13.  Last yr scrip should have covered 360 days - pt should have adequate supply until late April.  Also, pt scrip filled by mail order, she now wants scrip to go to local pharmacy.  LMOVM for pt to return call.

## 2013-06-10 NOTE — Telephone Encounter (Signed)
Pt reports her bottle says 2 refills.  I advised her to contact pharmacy to refill - if none available the pharmacy can call us directly.  Call [facility] desk nurse if any problems.  Pt voiced understanding.

## 2013-06-11 ENCOUNTER — Other Ambulatory Visit: Payer: Self-pay | Admitting: *Deleted

## 2013-06-11 DIAGNOSIS — C50919 Malignant neoplasm of unspecified site of unspecified female breast: Secondary | ICD-10-CM

## 2013-06-11 MED ORDER — ANASTROZOLE 1 MG PO TABS: 1.0000 mg | ORAL_TABLET | Freq: Every day | ORAL | Status: DC

## 2013-06-22 ENCOUNTER — Other Ambulatory Visit: Payer: Self-pay | Admitting: Internal Medicine

## 2013-06-22 DIAGNOSIS — E782 Mixed hyperlipidemia: Secondary | ICD-10-CM

## 2013-06-22 MED ORDER — ATORVASTATIN CALCIUM 80 MG PO TABS: ORAL_TABLET | ORAL | Status: DC

## 2013-06-23 ENCOUNTER — Telehealth: Payer: Self-pay

## 2013-06-23 MED ORDER — AZITHROMYCIN 250 MG PO TABS: ORAL_TABLET | ORAL | Status: AC

## 2013-06-23 NOTE — Telephone Encounter (Signed)
Pt called c/o sore throat, cough, runny nose. X3weeks. Please advise. [place]

## 2013-06-23 NOTE — Telephone Encounter (Signed)
Patient aware of instructions and RX to pharmacy

## 2013-06-30 ENCOUNTER — Encounter: Payer: Self-pay | Admitting: Internal Medicine

## 2013-06-30 MED ORDER — LEVOTHYROXINE SODIUM 100 MCG PO TABS: 100.0000 ug | ORAL_TABLET | Freq: Every day | ORAL | Status: DC

## 2013-07-08 ENCOUNTER — Telehealth: Payer: Self-pay | Admitting: Oncology

## 2013-07-08 NOTE — Telephone Encounter (Signed)
kk out - moved 4/30 appt to 5/8 w/PK. lmonvm informing pt and mailed schedule.

## 2013-07-15 ENCOUNTER — Other Ambulatory Visit: Payer: Medicare Other

## 2013-07-15 ENCOUNTER — Ambulatory Visit: Payer: Medicare Other | Admitting: Oncology

## 2013-07-23 ENCOUNTER — Ambulatory Visit (HOSPITAL_BASED_OUTPATIENT_CLINIC_OR_DEPARTMENT_OTHER): Payer: Medicare HMO | Admitting: Hematology and Oncology

## 2013-07-23 ENCOUNTER — Other Ambulatory Visit: Payer: Self-pay | Admitting: Internal Medicine

## 2013-07-23 ENCOUNTER — Telehealth: Payer: Self-pay | Admitting: Hematology and Oncology

## 2013-07-23 ENCOUNTER — Other Ambulatory Visit: Payer: Medicare Other

## 2013-07-23 VITALS — BP 162/74 | HR 84 | Temp 98.3°F | Resp 18 | Ht 61.0 in | Wt 128.0 lb

## 2013-07-23 DIAGNOSIS — C50912 Malignant neoplasm of unspecified site of left female breast: Secondary | ICD-10-CM

## 2013-07-23 DIAGNOSIS — M899 Disorder of bone, unspecified: Secondary | ICD-10-CM

## 2013-07-23 DIAGNOSIS — C50519 Malignant neoplasm of lower-outer quadrant of unspecified female breast: Secondary | ICD-10-CM

## 2013-07-23 DIAGNOSIS — M949 Disorder of cartilage, unspecified: Secondary | ICD-10-CM

## 2013-07-23 DIAGNOSIS — Z170 Estrogen receptor positive status [ER+]: Secondary | ICD-10-CM

## 2013-07-23 NOTE — Telephone Encounter (Signed)
per pof to sch appt/pt stated will sch her own mamma-gave pt copy of sch

## 2013-07-23 NOTE — Progress Notes (Signed)
[facility]  [phone] [phone]  OFFICE PROGRESS NOTE  PATIENT: Haley Shah   DOB: 28-Oct-1940  MR#: [id number]  OFB#:[id number]   PO:[id number] DAVID, MD Lisabeth Register, M.D. Tyler Pita, M.D.   DIAGNOSIS: a 73 year old [place], [place] woman with the history of left breast invasive ductal carcinoma diagnosed in 10/2008.   PRIOR THERAPY: 1.  Status post left breasts needle core biopsy on 11/07/2008 which showed a 3 mm invasive ductal carcinoma with DCIS and calcifications/necrosis, stage 1, T1a, N0,  ER 100%, PR 0%, Ki-67 10%, HER2/neu negative by CISH with a ratio 0.87.   2.  Status post left press lumpectomy with sentinel node excision and re-excision of medial margin on 11/23/2008, which showed no residual invasive carcinoma identified, pTX, pN0, ER 100%, PR 0%, Ki-67 10%, HER2/neu by CISH no amplification, 0/3 positive lymph nodes.  3.  Status post radiation therapy from 01/05/2009 until 02/17/2009.  4.  The patient started antiestrogen therapy with Tamoxifen from 02/2009 through 06/2011.  The patient then switched antiestrogen therapy to Arimidex in 06/2011 until the present time.  CURRENT THERAPY:  Arimidex 1 mg PO daily since 06/2011.   INTERVAL HISTORY: [name] is here for followup visit for left breast cancer. She is tolerating Arimidex well. She does complain of intermittent vaginal dryness and also minimal hot flushes. She regularly do the exercise and follows healthy diet. Her last mammogram was in October 2014 that revealed no evidence of any malignancy. A DEXA imaging was performed in October 2014 that revealed low bone mass. She continued to take vitamin D supplementation she says she cannot swallow calcium and stopped taking calcium.  She denies any weight loss, decrease in appetite, fever, headaches, blurred vision, shortness of breath, chest pain, palpitations, blood in the stool or blood in the urine.  PAST  MEDICAL HISTORY: Past Medical History  Diagnosis Date  . Dyspnea   . GERD (gastroesophageal reflux disease)   . History of thyroid cancer   . IBS (irritable bowel syndrome)   . Sciatica     RLE  . Hypertension   . Hyperlipidemia   . Cancer 2010    Left Breast  . Vitamin D deficiency   . Pre-diabetes   . Allergy   . Thyroid disease     Hypo    PAST SURGICAL HISTORY: Past Surgical History  Procedure Laterality Date  . Thyroid surgery  1986  . Breast lumpectomy  2010    LEFT BREAST    FAMILY HISTORY: Family History  Problem Relation Age of Onset  . Colon cancer Father   . Stroke Father   . Cancer Father     Pancreatic    SOCIAL HISTORY: History  Substance Use Topics  . Smoking status: Never Smoker   . Smokeless tobacco: Never Used  . Alcohol Use: No    ALLERGIES: Allergies  Allergen Reactions  . Neomycin-Bacitracin Zn-Polymyx     REACTION: rash  . Propoxyphene Hcl     REACTION: syncope  . Darvon Other (See Comments)    Almost fainted when taking, over 50 years.    MEDICATIONS:  Current Outpatient Prescriptions  Medication Sig Dispense Refill  . anastrozole (ARIMIDEX) 1 MG tablet Take 1 tablet (1 mg total) by mouth daily.  90 tablet  0  . atorvastatin (LIPITOR) 80 MG tablet Take 1 tablet daily or as directed for cholesterol-  Pt takes Tues Thurs & Sat      . levothyroxine (LEVOXYL) 100 MCG tablet  Take 1 tablet (100 mcg total) by mouth daily.  90 tablet  0  . loratadine (CLARITIN) 10 MG tablet Take 10 mg by mouth daily.      Marland Kitchen VITAMIN D, CHOLECALCIFEROL, PO Take 2,000 Int'l Units by mouth.       . Wheat Dextrin (BENEFIBER) POWD Take 2 scoop by mouth daily.      . Multiple Vitamins-Minerals (MULTIVITAMIN PO) Take by mouth daily.       No current facility-administered medications for this visit.      REVIEW OF SYSTEMS: A 10 point review of systems was completed and is negative except as noted in interval history   PHYSICAL EXAMINATION: BP 162/74   Pulse 84  Temp(Src) 98.3 F (36.8 C) (Oral)  Resp 18  Ht 5' 1"  (1.549 m)  Wt 128 lb (58.06 kg)  BMI 24.20 kg/m2, O2 98% on RA  General appearance: Alert, cooperative, well nourished, no apparent distress Head: Normocephalic, without obvious abnormality, atraumatic Eyes: Arcus senilis, PERRLA, EOMI Nose: Nares, septum and mucosa are normal, no drainage or sinus tenderness Neck: No adenopathy, supple, symmetrical, trachea midline, thyroid not enlarged, no tenderness Resp: Faint expiratory wheezing in right upper lobe and left upper lobe Cardio: Regular rate and rhythm, S1, S2 normal, no murmur, click, rub or gallop Breasts: Left breast status post lumpectomy scar noted no masses felt.right breast no masses felt no bilateral axillary lymphadenopathy noted  GI: Soft, not distended, non-tender, hypoactive bowel sounds, no organomegaly Extremities: Extremities normal, atraumatic, no cyanosis or edema, limited left lower extremity range of motion Lymph nodes: Cervical, supraclavicular, and axillary nodes normal Neurologic: Grossly normal    ECOG FS:  Grade 1 - Symptomatic but completely ambulatory           LAB RESULTS: Lab Results  Component Value Date   WBC 5.9 04/23/2013   NEUTROABS 4.1 04/23/2013   HGB 14.0 04/23/2013   HCT 41.0 04/23/2013   MCV 89.3 04/23/2013   PLT 240 04/23/2013      Chemistry      Component Value Date/Time   NA 141 04/23/2013 1159   K 4.2 04/23/2013 1159   CL 103 04/23/2013 1159   CO2 26 04/23/2013 1159   BUN 20 04/23/2013 1159   CREATININE 0.91 04/23/2013 1159   CREATININE 0.87 06/26/2011 0942      Component Value Date/Time   CALCIUM 9.6 04/23/2013 1159   ALKPHOS 73 04/23/2013 1159   AST 19 04/23/2013 1159   ALT 13 04/23/2013 1159   BILITOT 0.4 04/23/2013 1159       Lab Results  Component Value Date   LABCA2 25 06/26/2011     RADIOGRAPHIC STUDIES: No results found.  ASSESSMENT: 73 y.o. [place], [place] woman: 1.  Status post left breasts needle core  biopsy on 11/07/2008 which showed a 3 mm invasive ductal carcinoma with DCIS and calcifications/necrosis, stage 1, T1a, N0,  ER 100%, PR 0%, Ki-67 10%, HER2/neu negative by CISH with a ratio 0.87.   Status post left press lumpectomy with sentinel node excision and re-excision of medial margin on 11/23/2008, which showed no residual invasive carcinoma identified, pTX, pN0, ER 100%, PR 0%, Ki-67 10%, HER2/neu by CISH no amplification, 0/3 positive lymph nodes. Status post radiation therapy from 01/05/2009 until 02/17/2009.  The patient started antiestrogen therapy with Tamoxifen from 02/2009 through 06/2011.  The patient then switched antiestrogen therapy to Arimidex in 06/2011 until the present time.  2.  Vaginal dryness  3.  Osteopenia  PLAN: 1.  Brinsley is tolerating Arimidex well. continue Arimidex until 02/2014.  I have reviewed her diagnostic mammogram which was performed in October 2014 that revealed no evidence of any malignancy. I have also reviewed the CBC differential and CMP which was done in February 2015 and those are acceptable. I have asked the patient to stop taking the Arimidex from January 2016. Next digital diagnostic mammogram scheduled in October 2015  2. Over-the-counter remedies for vaginal dryness was discussed with the patient.  3.  Her bone density scan performed in October 2014 revealed low bone mass. Since patient is not able to swallow the calcium tab, I have asked the patient to [facility] calcium chews or liquids suspension(1239m) daily  and continue vitamin D3 1000 IUs daily  Her next bone densitometry in October 2016  Next followup visit in one year with CBC differential and CMP on the day of next visit  All questions were answered.  The patient was encouraged to contact [facility] the interim with any problems, questions or concerns.    PWilmon Arms M.D. Medical oncology  07/23/2013, 11:13 AM

## 2013-07-26 ENCOUNTER — Encounter: Payer: Self-pay | Admitting: Internal Medicine

## 2013-07-27 ENCOUNTER — Encounter: Payer: Self-pay | Admitting: Physician Assistant

## 2013-07-27 ENCOUNTER — Ambulatory Visit (INDEPENDENT_AMBULATORY_CARE_PROVIDER_SITE_OTHER): Payer: Medicare HMO | Admitting: Physician Assistant

## 2013-07-27 VITALS — BP 128/62 | HR 64 | Temp 98.1°F | Resp 16 | Ht 62.0 in | Wt 125.0 lb

## 2013-07-27 DIAGNOSIS — R7303 Prediabetes: Secondary | ICD-10-CM

## 2013-07-27 DIAGNOSIS — M949 Disorder of cartilage, unspecified: Secondary | ICD-10-CM

## 2013-07-27 DIAGNOSIS — M899 Disorder of bone, unspecified: Secondary | ICD-10-CM

## 2013-07-27 DIAGNOSIS — Z79899 Other long term (current) drug therapy: Secondary | ICD-10-CM

## 2013-07-27 DIAGNOSIS — Z0000 Encounter for general adult medical examination without abnormal findings: Secondary | ICD-10-CM

## 2013-07-27 DIAGNOSIS — T7840XA Allergy, unspecified, initial encounter: Secondary | ICD-10-CM

## 2013-07-27 DIAGNOSIS — E559 Vitamin D deficiency, unspecified: Secondary | ICD-10-CM

## 2013-07-27 DIAGNOSIS — E039 Hypothyroidism, unspecified: Secondary | ICD-10-CM

## 2013-07-27 DIAGNOSIS — Z789 Other specified health status: Secondary | ICD-10-CM

## 2013-07-27 DIAGNOSIS — Z1331 Encounter for screening for depression: Secondary | ICD-10-CM

## 2013-07-27 DIAGNOSIS — E785 Hyperlipidemia, unspecified: Secondary | ICD-10-CM

## 2013-07-27 LAB — HEPATIC FUNCTION PANEL
ALBUMIN: 4.3 g/dL (ref 3.5–5.2)
ALT: 13 U/L (ref 0–35)
AST: 17 U/L (ref 0–37)
Alkaline Phosphatase: 80 U/L (ref 39–117)
BILIRUBIN TOTAL: 0.6 mg/dL (ref 0.2–1.2)
Bilirubin, Direct: 0.1 mg/dL (ref 0.0–0.3)
Indirect Bilirubin: 0.5 mg/dL (ref 0.2–1.2)
Total Protein: 7 g/dL (ref 6.0–8.3)

## 2013-07-27 LAB — CBC WITH DIFFERENTIAL/PLATELET
BASOS PCT: 1 % (ref 0–1)
Basophils Absolute: 0.1 10*3/uL (ref 0.0–0.1)
EOS ABS: 0.1 10*3/uL (ref 0.0–0.7)
Eosinophils Relative: 1 % (ref 0–5)
HEMATOCRIT: 39.5 % (ref 36.0–46.0)
HEMOGLOBIN: 13.4 g/dL (ref 12.0–15.0)
LYMPHS ABS: 1.7 10*3/uL (ref 0.7–4.0)
Lymphocytes Relative: 28 % (ref 12–46)
MCH: 29.7 pg (ref 26.0–34.0)
MCHC: 33.9 g/dL (ref 30.0–36.0)
MCV: 87.6 fL (ref 78.0–100.0)
MONO ABS: 0.7 10*3/uL (ref 0.1–1.0)
Monocytes Relative: 11 % (ref 3–12)
Neutro Abs: 3.7 10*3/uL (ref 1.7–7.7)
Neutrophils Relative %: 59 % (ref 43–77)
Platelets: 260 10*3/uL (ref 150–400)
RBC: 4.51 MIL/uL (ref 3.87–5.11)
RDW: 14.3 % (ref 11.5–15.5)
WBC: 6.2 10*3/uL (ref 4.0–10.5)

## 2013-07-27 LAB — BASIC METABOLIC PANEL WITH GFR
BUN: 11 mg/dL (ref 6–23)
CO2: 28 meq/L (ref 19–32)
CREATININE: 0.89 mg/dL (ref 0.50–1.10)
Calcium: 9.5 mg/dL (ref 8.4–10.5)
Chloride: 103 mEq/L (ref 96–112)
GFR, Est African American: 74 mL/min
GFR, Est Non African American: 64 mL/min
GLUCOSE: 78 mg/dL (ref 70–99)
Potassium: 4.1 mEq/L (ref 3.5–5.3)
SODIUM: 140 meq/L (ref 135–145)

## 2013-07-27 LAB — LIPID PANEL
CHOLESTEROL: 183 mg/dL (ref 0–200)
HDL: 51 mg/dL (ref >39)
LDL CALC: 113 mg/dL — AB (ref 0–99)
TRIGLYCERIDES: 93 mg/dL (ref <150)
Total CHOL/HDL Ratio: 3.6 Ratio
VLDL: 19 mg/dL (ref 0–40)

## 2013-07-27 LAB — MAGNESIUM: MAGNESIUM: 2.1 mg/dL (ref 1.5–2.5)

## 2013-07-27 LAB — TSH: TSH: 0.451 u[IU]/mL (ref 0.350–4.500)

## 2013-07-27 NOTE — Progress Notes (Signed)
Assessment:   RLS vs from statin- Add magnesium, check labs, do exercises before bed, if not better decrease statin.   1. HYPOTHYROIDISM - TSH  2. Pre-diabetes at goal  3. OSTEOPENIA Due next year for DEXA, cont vitamin D  4. HYPERLIPIDEMIA - CBC with Differential - Hepatic function panel - BASIC METABOLIC PANEL WITH GFR - Lipid panel  5. Allergy  6. Vitamin D deficiency - Vit D  25 hydroxy (rtn osteoporosis monitoring)  7. Encounter for long-term (current) use of other medications - Magnesium   Plan:   During the course of the visit the patient was educated and counseled about appropriate screening and preventive services including:    Pneumococcal vaccine   Influenza vaccine  Td vaccine  Screening electrocardiogram  Screening mammography  Bone densitometry screening  Colorectal cancer screening  Diabetes screening  Glaucoma screening  Nutrition counseling   Screening recommendations, referrals:  Vaccinations: Tdap vaccine not indicated Influenza vaccine not indicated Pneumococcal vaccine not indicated Shingles vaccine declined Hep B vaccine not indicated  Nutrition assessed and recommended  Colonoscopy not indicated Mammogram not indicated Pap smear declined Pelvic exam declined Recommended yearly ophthalmology/optometry visit for glaucoma screening and checkup Recommended yearly dental visit for hygiene and checkup Advanced directives - requested  Conditions/risks identified: BMI: Discussed weight loss, diet, and increase physical activity.  Increase physical activity: AHA recommends 150 minutes of physical activity a week.  Medications reviewed DEXA- due next year Diabetes is at goal Urinary Incontinence is not an issue: discussed non pharmacology and pharmacology options.  Fall risk: low- discussed PT, home fall assessment, medications.    Subjective:   Haley Shah is a 73 y.o. female who presents for Medicare Annual Wellness  Visit and 3 month follow up on hypertension, prediabetes, hyperlipidemia, vitamin D def.  Date of last medicare wellness visit is unknown.   Her blood pressure has been controlled at home, today their BP is BP: 128/62 mmHg She does workout, walks but not on a regular basis due to tendonitis in left heel. She denies chest pain, shortness of breath, dizziness.  She is on cholesterol medication, lipitor 80 1/2 3 days a week, she was switched from pravastatin and she states she has some leg pain at night, she states she can not stop moving them and moving them or sleeping on the floor makes it feel better. Her cholesterol is not at goal. The cholesterol last visit was:   Lab Results  Component Value Date   CHOL 227* 04/23/2013   HDL 58 04/23/2013   LDLCALC 149* 04/23/2013   TRIG 99 04/23/2013   CHOLHDL 3.9 04/23/2013    Last A1C in the office was:  Lab Results  Component Value Date   HGBA1C 5.6 04/23/2013   Patient is on Vitamin D supplement. She is on thyroid medication. Her medication was not changed last visit. Patient denies nervousness and palpitations.  Lab Results  Component Value Date   TSH 0.672 04/23/2013  .   Names of Other Physician/Practitioners you currently use: 1. [facility]- here for primary care 2. Dr. Katy Fitch, eye doctor, last visit few months ago, Sept 2015 3.  Dr. Harmon Pier , dentist, last visit 8 months ago Patient Care Team: Unk Pinto, MD as PCP - General (Internal Medicine)  Medication Review Current Outpatient Prescriptions on File Prior to Visit  Medication Sig Dispense Refill  . anastrozole (ARIMIDEX) 1 MG tablet Take 1 tablet (1 mg total) by mouth daily.  90 tablet  0  . atorvastatin (LIPITOR) 80 MG tablet Take 1 tablet daily or as directed for cholesterol-  Pt takes Tues Thurs & Sat      . levothyroxine (LEVOXYL) 100 MCG tablet Take 1 tablet (100 mcg total) by mouth daily.  90 tablet  0  . loratadine (CLARITIN) 10 MG tablet  Take 10 mg by mouth daily.      . Multiple Vitamins-Minerals (MULTIVITAMIN PO) Take by mouth daily.      Marland Kitchen VITAMIN D, CHOLECALCIFEROL, PO Take 2,000 Int'l Units by mouth.       . Wheat Dextrin (BENEFIBER) POWD Take 2 scoop by mouth daily.       No current facility-administered medications on file prior to visit.    Current Problems (verified) Patient Active Problem List   Diagnosis Date Noted  . Encounter for long-term (current) use of other medications 04/23/2013  . Allergy   . Pre-diabetes   . Vitamin D deficiency   . Cancer of left breast 11/03/2012  . HYPOTHYROIDISM 03/22/2008  . HYPERLIPIDEMIA 03/22/2008  . G E R D 03/22/2008  . IBS 03/22/2008  . OSTEOPENIA 03/22/2008  . VERTIGO 03/22/2008  . THYROID CANCER, HX OF 03/22/2008    Screening Tests Health Maintenance  Topic Date Due  . Zostavax  03/03/1999  . Influenza Vaccine  10/16/2013  . Mammogram  12/17/2014  . Tetanus/tdap  03/19/2015  . Colonoscopy  03/19/2019  . Pneumococcal Polysaccharide Vaccine Age 37 And Over  Completed     Immunization History  Administered Date(s) Administered  . Influenza, High Dose Seasonal PF 01/05/2013  . Pneumococcal Polysaccharide-23 03/18/2008  . Td 03/18/2005    Preventative care: Last colonoscopy: 2011 due 10 years Last mammogram: 12/2012 Last pap smear/pelvic exam: 2012 DEXA: 12/2012  Prior vaccinations: TD or Tdap: 207  Influenza: 2014 Pneumococcal: 2010 Shingles/Zostavax:   History reviewed: allergies, current medications, past family history, past medical history, past social history, past surgical history and problem list  Risk Factors: Osteoporosis: postmenopausal estrogen deficiency and dietary calcium and/or vitamin D deficiency History of fracture in the past year: no  Tobacco History  Substance Use Topics  . Smoking status: Never Smoker   . Smokeless tobacco: Never Used  . Alcohol Use: No   She does not smoke.  Patient is not a former smoker. Are  there smokers in your home (other than you)?  No  Alcohol Current alcohol use: none  Caffeine Current caffeine use: coffee 1 /day  Exercise Exercise limitations: The patient has no exercise limitations. Current exercise: housecleaning and walking  Nutrition/Diet Current diet: in general, a "healthy" diet    Cardiac risk factors: advanced age (older than 73 for men, 56 for women), dyslipidemia, hypertension and sedentary lifestyle.  Depression Screen (Note: if answer to either of the following is "Yes", a more complete depression screening is indicated)   Q1: Over the past two weeks, have you felt down, depressed or hopeless? No  Q2: Over the past two weeks, have you felt little interest or pleasure in doing things? No  Have you lost interest or pleasure in daily life? No  Do you often feel hopeless? No  Do you cry easily over simple problems? No  Activities of Daily Living In your present state of health, do you have any difficulty performing the following activities?:  Driving? No Managing money?  No Feeding yourself? No Getting from bed to chair? No Climbing a flight of stairs? No Preparing food and eating?: No Bathing or showering? No  Getting dressed: No Getting to the toilet? No Using the toilet:No Moving around from place to place: No In the past year have you fallen or had a near fall?:No   Are you sexually active?  No  Do you have more than one partner?  No  Vision Difficulties: No  Hearing Difficulties: No Do you often ask people to speak up or repeat themselves? No Do you experience ringing or noises in your ears? No Do you have difficulty understanding soft or whispered voices? No  Cognition  Do you feel that you have a problem with memory?No  Do you often misplace items? No  Do you feel safe at home?  Yes  Advanced directives Does patient have a [place]? Yes Does patient have a Living Will? Yes   Objective:   Blood  pressure 128/62, pulse 64, temperature 98.1 F (36.7 C), resp. rate 16, height 5\' 2"  (1.575 m), weight 125 lb (56.7 kg). Body mass index is 22.86 kg/(m^2).  General appearance: alert, no distress, WD/WN,  female Cognitive Testing  Alert? Yes  Normal Appearance?Yes  Oriented to person? Yes  Place? Yes   Time? Yes  Recall of three objects?  Yes  Can perform simple calculations? Yes  Displays appropriate judgment?Yes  Can read the correct time from a watch face?Yes  HEENT: normocephalic, sclerae anicteric, TMs pearly, nares patent, no discharge or erythema, pharynx normal Oral cavity: MMM, no lesions Neck: supple, no lymphadenopathy, no thyromegaly, no masses Heart: RRR, normal S1, S2, no murmurs Lungs: CTA bilaterally, no wheezes, rhonchi, or rales Abdomen: +bs, soft, non tender, non distended, no masses, no hepatomegaly, no splenomegaly Musculoskeletal: nontender, no swelling, no obvious deformity Extremities: no edema, no cyanosis, no clubbing Pulses: 2+ symmetric, upper and lower extremities, normal cap refill Neurological: alert, oriented x 3, CN2-12 intact, strength normal upper extremities and lower extremities, sensation normal throughout, DTRs 2+ throughout, no cerebellar signs, gait normal Psychiatric: normal affect, behavior normal, pleasant  Breast: defer Gyn: defer Rectal: defer  Medicare Attestation I have personally reviewed: The patient's medical and social history Their use of alcohol, tobacco or illicit drugs Their current medications and supplements The patient's functional ability including ADLs,fall risks, home safety risks, cognitive, and hearing and visual impairment Diet and physical activities Evidence for depression or mood disorders  The patient's weight, height, BMI, and visual acuity have been recorded in the chart.  I have made referrals, counseling, and provided education to the patient based on review of the above and I have provided the patient with  a written personalized care plan for preventive services.     Vicie Mutters, PA-C   07/27/2013

## 2013-07-27 NOTE — Patient Instructions (Signed)
Get back on magnesium Suggest moving legs before bed  Preventative Care for Adults - Female      MAINTAIN REGULAR HEALTH EXAMS:  A routine yearly physical is a good way to check in with your primary care provider about your health and preventive screening. It is also an opportunity to share updates about your health and any concerns you have, and receive a thorough all-over exam.   Most health insurance companies pay for at least some preventative services.  Check with your health plan for specific coverages.  WHAT PREVENTATIVE SERVICES DO WOMEN NEED?  Adult women should have their weight and blood pressure checked regularly.   Women age 26 and older should have their cholesterol levels checked regularly.  Women should be screened for cervical cancer with a Pap smear and pelvic exam beginning at either age 3, or 3 years after they become sexually activity.    Breast cancer screening generally begins at age 6 with a mammogram and breast exam by your primary care provider.    Beginning at age 51 and continuing to age 50, women should be screened for colorectal cancer.  Certain people may need continued testing until age 27.  Updating vaccinations is part of preventative care.  Vaccinations help protect against diseases such as the flu.  Osteoporosis is a disease in which the bones lose minerals and strength as we age. Women ages 67 and over should discuss this with their caregivers, as should women after menopause who have other risk factors.  Lab tests are generally done as part of preventative care to screen for anemia and blood disorders, to screen for problems with the kidneys and liver, to screen for bladder problems, to check blood sugar, and to check your cholesterol level.  Preventative services generally include counseling about diet, exercise, avoiding tobacco, drugs, excessive alcohol consumption, and sexually transmitted infections.    GENERAL RECOMMENDATIONS FOR GOOD  HEALTH:  Healthy diet:  Eat a variety of foods, including fruit, vegetables, animal or vegetable protein, such as meat, fish, chicken, and eggs, or beans, lentils, tofu, and grains, such as rice.  Drink plenty of water daily.  Decrease saturated fat in the diet, avoid lots of red meat, processed foods, sweets, fast foods, and fried foods.  Exercise:  Aerobic exercise helps maintain good heart health. At least 30-40 minutes of moderate-intensity exercise is recommended. For example, a brisk walk that increases your heart rate and breathing. This should be done on most days of the week.   Find a type of exercise or a variety of exercises that you enjoy so that it becomes a part of your daily life.  Examples are running, walking, swimming, water aerobics, and biking.  For motivation and support, explore group exercise such as aerobic class, spin class, Zumba, Yoga,or  martial arts, etc.    Set exercise goals for yourself, such as a certain weight goal, walk or run in a race such as a 5k walk/run.  Speak to your primary care provider about exercise goals.  Disease prevention:  If you smoke or chew tobacco, find out from your caregiver how to quit. It can literally save your life, no matter how long you have been a tobacco user. If you do not use tobacco, never begin.   Maintain a healthy diet and normal weight. Increased weight leads to problems with blood pressure and diabetes.   The Body Mass Index or BMI is a way of measuring how much of your body is fat. Having  a BMI above 27 increases the risk of heart disease, diabetes, hypertension, stroke and other problems related to obesity. Your caregiver can help determine your BMI and based on it develop an exercise and dietary program to help you achieve or maintain this important measurement at a healthful level.  High blood pressure causes heart and blood vessel problems.  Persistent high blood pressure should be treated with medicine if weight  loss and exercise do not work.   Fat and cholesterol leaves deposits in your arteries that can block them. This causes heart disease and vessel disease elsewhere in your body.  If your cholesterol is found to be high, or if you have heart disease or certain other medical conditions, then you may need to have your cholesterol monitored frequently and be treated with medication.   Ask if you should have a cardiac stress test if your history suggests this. A stress test is a test done on a treadmill that looks for heart disease. This test can find disease prior to there being a problem.  Menopause can be associated with physical symptoms and risks. Hormone replacement therapy is available to decrease these. You should talk to your caregiver about whether starting or continuing to take hormones is right for you.   Osteoporosis is a disease in which the bones lose minerals and strength as we age. This can result in serious bone fractures. Risk of osteoporosis can be identified using a bone density scan. Women ages 72 and over should discuss this with their caregivers, as should women after menopause who have other risk factors. Ask your caregiver whether you should be taking a calcium supplement and Vitamin D, to reduce the rate of osteoporosis.   Avoid drinking alcohol in excess (more than two drinks per day).  Avoid use of street drugs. Do not share needles with anyone. Ask for professional help if you need assistance or instructions on stopping the use of alcohol, cigarettes, and/or drugs.  Brush your teeth twice a day with fluoride toothpaste, and floss once a day. Good oral hygiene prevents tooth decay and gum disease. The problems can be painful, unattractive, and can cause other health problems. Visit your dentist for a routine oral and dental check up and preventive care every 6-12 months.   Look at your skin regularly.  Use a mirror to look at your back. Notify your caregivers of changes in moles,  especially if there are changes in shapes, colors, a size larger than a pencil eraser, an irregular border, or development of new moles.  Safety:  Use seatbelts 100% of the time, whether driving or as a passenger.  Use safety devices such as hearing protection if you work in environments with loud noise or significant background noise.  Use safety glasses when doing any work that could send debris in to the eyes.  Use a helmet if you ride a bike or motorcycle.  Use appropriate safety gear for contact sports.  Talk to your caregiver about gun safety.  Use sunscreen with a SPF (or skin protection factor) of 15 or greater.  Lighter skinned people are at a greater risk of skin cancer. Don't forget to also wear sunglasses in order to protect your eyes from too much damaging sunlight. Damaging sunlight can accelerate cataract formation.   Practice safe sex. Use condoms. Condoms are used for birth control and to help reduce the spread of sexually transmitted infections (or STIs).  Some of the STIs are gonorrhea (the clap), chlamydia, syphilis, trichomonas,  herpes, HPV (human papilloma virus) and HIV (human immunodeficiency virus) which causes AIDS. The herpes, HIV and HPV are viral illnesses that have no cure. These can result in disability, cancer and death.   Keep carbon monoxide and smoke detectors in your home functioning at all times. Change the batteries every 6 months or use a model that plugs into the wall.   Vaccinations:  Stay up to date with your tetanus shots and other required immunizations. You should have a booster for tetanus every 10 years. Be sure to get your flu shot every year, since 5%-20% of the [place] population comes down with the flu. The flu vaccine changes each year, so being vaccinated once is not enough. Get your shot in the fall, before the flu season peaks.   Other vaccines to consider:  Human Papilloma Virus or HPV causes cancer of the cervix, and other infections that can be  transmitted from person to person. There is a vaccine for HPV, and females should get immunized between the ages of 85 and 9. It requires a series of 3 shots.   Pneumococcal vaccine to protect against certain types of pneumonia.  This is normally recommended for adults age 61 or older.  However, adults younger than 76 years old with certain underlying conditions such as diabetes, heart or lung disease should also receive the vaccine.  Shingles vaccine to protect against Varicella Zoster if you are older than age 39, or younger than 75 years old with certain underlying illness.  Hepatitis A vaccine to protect against a form of infection of the liver by a virus acquired from food.  Hepatitis B vaccine to protect against a form of infection of the liver by a virus acquired from blood or body fluids, particularly if you work in health care.  If you plan to travel internationally, check with your local health department for specific vaccination recommendations.  Cancer Screening:  Breast cancer screening is essential to preventive care for women. All women age 9 and older should perform a breast self-exam every month. At age 25 and older, women should have their caregiver complete a breast exam each year. Women at ages 45 and older should have a mammogram (x-ray film) of the breasts. Your caregiver can discuss how often you need mammograms.    Cervical cancer screening includes taking a Pap smear (sample of cells examined under a microscope) from the cervix (end of the uterus). It also includes testing for HPV (Human Papilloma Virus, which can cause cervical cancer). Screening and a pelvic exam should begin at age 92, or 3 years after a woman becomes sexually active. Screening should occur every year, with a Pap smear but no HPV testing, up to age 6. After age 39, you should have a Pap smear every 3 years with HPV testing, if no HPV was found previously.   Most routine colon cancer screening begins  at the age of 73. On a yearly basis, doctors may provide special easy to use take-home tests to check for hidden blood in the stool. Sigmoidoscopy or colonoscopy can detect the earliest forms of colon cancer and is life saving. These tests use a small camera at the end of a tube to directly examine the colon. Speak to your caregiver about this at age 16, when routine screening begins (and is repeated every 5 years unless early forms of pre-cancerous polyps or small growths are found).

## 2013-07-28 LAB — VITAMIN D 25 HYDROXY (VIT D DEFICIENCY, FRACTURES): VIT D 25 HYDROXY: 85 ng/mL (ref 30–89)

## 2013-09-29 ENCOUNTER — Telehealth: Payer: Self-pay | Admitting: *Deleted

## 2013-09-29 NOTE — Telephone Encounter (Signed)
Patient called and states she has been having stomach cramps x 2 days, but no fever, diarrhea or nausea or vomiting.  She states she feels better today.  Dr Melford Aase advised a bland diet and offered an RX for Levisin 0.125 mg,  which patient declined.  Patient will call back PRN.

## 2013-10-14 ENCOUNTER — Other Ambulatory Visit: Payer: Self-pay | Admitting: *Deleted

## 2013-10-14 DIAGNOSIS — C50919 Malignant neoplasm of unspecified site of unspecified female breast: Secondary | ICD-10-CM

## 2013-10-14 MED ORDER — ANASTROZOLE 1 MG PO TABS: 1.0000 mg | ORAL_TABLET | Freq: Every day | ORAL | Status: DC

## 2013-10-27 ENCOUNTER — Ambulatory Visit (INDEPENDENT_AMBULATORY_CARE_PROVIDER_SITE_OTHER): Payer: Medicare HMO | Admitting: Internal Medicine

## 2013-10-27 ENCOUNTER — Encounter: Payer: Self-pay | Admitting: Internal Medicine

## 2013-10-27 VITALS — BP 122/70 | HR 68 | Temp 97.9°F | Resp 18 | Ht 62.0 in | Wt 130.0 lb

## 2013-10-27 DIAGNOSIS — I10 Essential (primary) hypertension: Secondary | ICD-10-CM | POA: Insufficient documentation

## 2013-10-27 DIAGNOSIS — R7303 Prediabetes: Secondary | ICD-10-CM

## 2013-10-27 DIAGNOSIS — E559 Vitamin D deficiency, unspecified: Secondary | ICD-10-CM

## 2013-10-27 DIAGNOSIS — Z79899 Other long term (current) drug therapy: Secondary | ICD-10-CM

## 2013-10-27 DIAGNOSIS — R7309 Other abnormal glucose: Secondary | ICD-10-CM

## 2013-10-27 DIAGNOSIS — E782 Mixed hyperlipidemia: Secondary | ICD-10-CM

## 2013-10-27 LAB — LIPID PANEL
Cholesterol: 187 mg/dL (ref 0–200)
HDL: 51 mg/dL (ref >39)
LDL Cholesterol: 113 mg/dL — ABNORMAL HIGH (ref 0–99)
Total CHOL/HDL Ratio: 3.7 Ratio
Triglycerides: 116 mg/dL (ref <150)
VLDL: 23 mg/dL (ref 0–40)

## 2013-10-27 LAB — CBC WITH DIFFERENTIAL/PLATELET
BASOS ABS: 0.1 10*3/uL (ref 0.0–0.1)
BASOS PCT: 1 % (ref 0–1)
Eosinophils Absolute: 0.2 10*3/uL (ref 0.0–0.7)
Eosinophils Relative: 3 % (ref 0–5)
HCT: 38.8 % (ref 36.0–46.0)
Hemoglobin: 13 g/dL (ref 12.0–15.0)
Lymphocytes Relative: 24 % (ref 12–46)
Lymphs Abs: 1.5 10*3/uL (ref 0.7–4.0)
MCH: 29.3 pg (ref 26.0–34.0)
MCHC: 33.5 g/dL (ref 30.0–36.0)
MCV: 87.6 fL (ref 78.0–100.0)
Monocytes Absolute: 0.8 10*3/uL (ref 0.1–1.0)
Monocytes Relative: 13 % — ABNORMAL HIGH (ref 3–12)
NEUTROS PCT: 59 % (ref 43–77)
Neutro Abs: 3.8 10*3/uL (ref 1.7–7.7)
PLATELETS: 234 10*3/uL (ref 150–400)
RBC: 4.43 MIL/uL (ref 3.87–5.11)
RDW: 13.9 % (ref 11.5–15.5)
WBC: 6.4 10*3/uL (ref 4.0–10.5)

## 2013-10-27 LAB — HEMOGLOBIN A1C
HEMOGLOBIN A1C: 5.5 % (ref <5.7)
MEAN PLASMA GLUCOSE: 111 mg/dL (ref <117)

## 2013-10-27 LAB — BASIC METABOLIC PANEL WITH GFR
BUN: 11 mg/dL (ref 6–23)
CHLORIDE: 104 meq/L (ref 96–112)
CO2: 29 mEq/L (ref 19–32)
Calcium: 9.6 mg/dL (ref 8.4–10.5)
Creat: 0.94 mg/dL (ref 0.50–1.10)
GFR, EST AFRICAN AMERICAN: 69 mL/min
GFR, EST NON AFRICAN AMERICAN: 60 mL/min
Glucose, Bld: 86 mg/dL (ref 70–99)
Potassium: 4.2 mEq/L (ref 3.5–5.3)
SODIUM: 142 meq/L (ref 135–145)

## 2013-10-27 LAB — HEPATIC FUNCTION PANEL
ALT: 15 U/L (ref 0–35)
AST: 21 U/L (ref 0–37)
Albumin: 4.5 g/dL (ref 3.5–5.2)
Alkaline Phosphatase: 70 U/L (ref 39–117)
Bilirubin, Direct: 0.1 mg/dL (ref 0.0–0.3)
Indirect Bilirubin: 0.4 mg/dL (ref 0.2–1.2)
Total Bilirubin: 0.5 mg/dL (ref 0.2–1.2)
Total Protein: 7 g/dL (ref 6.0–8.3)

## 2013-10-27 LAB — TSH: TSH: 1.282 u[IU]/mL (ref 0.350–4.500)

## 2013-10-27 LAB — MAGNESIUM: MAGNESIUM: 2.2 mg/dL (ref 1.5–2.5)

## 2013-10-27 NOTE — Patient Instructions (Signed)

## 2013-10-27 NOTE — [facility] Notes (Signed)
Patient ID: Haley Shah, female   DOB: [birth year], 73 y.o.   MRN: [id number]   This very nice 73 y.o.MWF presents for 3 month follow up with Hypertension, Hyperlipidemia, Pre-Diabetes and Vitamin D Deficiency.    Patient is treated for HTN & BP has been controlled at home. Today's BP: 122/70 mmHg. Patient denies any cardiac type chest pain, palpitations, dyspnea/orthopnea/PND, dizziness, claudication, or dependent edema.   Hyperlipidemia is controlled with diet & meds. Patient denies myalgias or other med SE's. Last Lipids were 07/27/2013: Cholesterol, Total 183; HDL Cholesterol by NMR 51; LDL (calc) 113*; Triglycerides 93   Also, the patient has history of PreDiabetes and patient denies any symptoms of reactive hypoglycemia, diabetic polys, paresthesias or visual blurring.  Last A1c was 04/23/2013: Hemoglobin-A1c 5.6   Further, Patient has history of Vitamin D Deficiency and patient supplements vitamin D without any suspected side-effects. Last vitamin D was 07/27/2013: Vit D, 25-Hydroxy 85.   Medication List   anastrozole 1 MG tablet  Commonly known as:  ARIMIDEX  Take 1 tablet (1 mg total) by mouth daily.     atorvastatin 80 MG tablet  Commonly known as:  LIPITOR  Take 1 tablet daily or as directed for cholesterol-  Pt takes Tues Thurs & Sat     BENEFIBER Powd  Take 2 scoop by mouth daily.     levothyroxine 100 MCG tablet  Commonly known as:  LEVOXYL  Take 1 tablet (100 mcg total) by mouth daily.     loratadine 10 MG tablet  Commonly known as:  CLARITIN  Take 10 mg by mouth daily.     MULTIVITAMIN PO  Take by mouth daily.     VITAMIN D (CHOLECALCIFEROL) PO  Take 2,000 Int'l Units by mouth.     Allergies  Allergen Reactions  . Neomycin-Bacitracin Zn-Polymyx     REACTION: rash  . Propoxyphene Hcl     REACTION: syncope  . Darvon Other (See Comments)    Almost fainted when taking, over 50 years.   PMHx:   Past Medical History  Diagnosis Date  . Dyspnea   . GERD  (gastroesophageal reflux disease)   . History of thyroid cancer   . IBS (irritable bowel syndrome)   . Sciatica     RLE  . Hypertension   . Hyperlipidemia   . Cancer 2010    Left Breast  . Vitamin D deficiency   . Pre-diabetes   . Allergy   . Thyroid disease     Hypo   FHx:    Reviewed / unchanged SHx:    Reviewed / unchanged  Systems Review:  Constitutional: Denies fever, chills, wt changes, headaches, insomnia, fatigue, night sweats, change in appetite. Eyes: Denies redness, blurred vision, diplopia, discharge, itchy, watery eyes.  ENT: Denies discharge, congestion, post nasal drip, epistaxis, sore throat, earache, hearing loss, dental pain, tinnitus, vertigo, sinus pain, snoring.  CV: Denies chest pain, palpitations, irregular heartbeat, syncope, dyspnea, diaphoresis, orthopnea, PND, claudication or edema. Respiratory: denies cough, dyspnea, DOE, pleurisy, hoarseness, laryngitis, wheezing.  Gastrointestinal: Denies dysphagia, odynophagia, heartburn, reflux, water brash, abdominal pain or cramps, nausea, vomiting, bloating, diarrhea, constipation, hematemesis, melena, hematochezia  or hemorrhoids. Genitourinary: Denies dysuria, frequency, urgency, nocturia, hesitancy, discharge, hematuria or flank pain. Musculoskeletal: Denies arthralgias, myalgias, stiffness, jt. swelling, pain, limping or strain/sprain.  Skin: Denies pruritus, rash, hives, warts, acne, eczema or change in skin lesion(s). Neuro: No weakness, tremor, incoordination, spasms, paresthesia or pain. Psychiatric: Denies confusion, memory loss or sensory loss. Endo:  Denies change in weight, skin or hair change.  Heme/Lymph: No excessive bleeding, bruising or enlarged lymph nodes.  Exam:  BP 122/70  Pulse 68  Temp(Src) 97.9 F (36.6 C) (Temporal)  Resp 18  Ht 5\' 2"  (1.575 m)  Wt 130 lb (58.968 kg)  BMI 23.77 kg/m2  Appears well nourished and in no distress. Eyes: PERRLA, EOMs, conjunctiva no swelling or  erythema. Sinuses: No frontal/maxillary tenderness ENT/Mouth: EAC's clear, TM's nl w/o erythema, bulging. Nares clear w/o erythema, swelling, exudates. Oropharynx clear without erythema or exudates. Oral hygiene is good. Tongue normal, non obstructing. Hearing intact.  Neck: Supple. Thyroid nl. Car 2+/2+ without bruits, nodes or JVD. Chest: Respirations nl with BS clear & equal w/o rales, rhonchi, wheezing or stridor.  Cor: Heart sounds normal w/ regular rate and rhythm without sig. murmurs, gallops, clicks, or rubs. Peripheral pulses normal and equal  without edema.  Abdomen: Soft & bowel sounds normal. Non-tender w/o guarding, rebound, hernias, masses, or organomegaly.  Lymphatics: Unremarkable.  Musculoskeletal: Full ROM all peripheral extremities, joint stability, 5/5 strength, and normal gait.  Skin: Warm, dry without exposed rashes, lesions or ecchymosis apparent.  Neuro: Cranial nerves intact, reflexes equal bilaterally. Sensory-motor testing grossly intact. Tendon reflexes grossly intact.  Pysch: Alert & oriented x 3. Insight and judgement nl & appropriate. No ideations.  Assessment and Plan:  1. Hypertension - Continue monitor blood pressure at home. Continue diet/meds same.  2. Hyperlipidemia - Continue diet/meds, exercise,& lifestyle modifications. Continue monitor periodic cholesterol/liver & renal functions   3. Pre-Diabetes - Continue diet, exercise, lifestyle modifications. Monitor appropriate labs.  4. Vitamin D Deficiency - Continue supplementation.  5. Hypothyroidism  Recommended regular exercise, BP monitoring, weight control, and discussed med and SE's. Recommended labs to assess and monitor clinical status. Further disposition pending results of labs.

## 2013-10-28 LAB — INSULIN, FASTING: INSULIN FASTING, SERUM: 11 u[IU]/mL (ref 3–28)

## 2013-10-28 LAB — VITAMIN D 25 HYDROXY (VIT D DEFICIENCY, FRACTURES): VIT D 25 HYDROXY: 86 ng/mL (ref 30–89)

## 2013-11-16 ENCOUNTER — Other Ambulatory Visit: Payer: Self-pay | Admitting: Physician Assistant

## 2013-12-20 ENCOUNTER — Telehealth: Payer: Self-pay

## 2013-12-20 NOTE — Telephone Encounter (Signed)
Order faxed to [facility] for mammo.  Sent to scan

## 2013-12-20 NOTE — Telephone Encounter (Signed)
Order for mammogram sent to [facility].  Sent to scan.

## 2013-12-29 ENCOUNTER — Telehealth: Payer: Self-pay

## 2013-12-29 NOTE — Telephone Encounter (Signed)
Mammogram rcvd from [facility] 12/21/13.  Copy to Dr Lindi Adie.  Original to scan.

## 2014-01-13 ENCOUNTER — Telehealth: Payer: Self-pay | Admitting: *Deleted

## 2014-01-13 NOTE — Telephone Encounter (Signed)
Patient called to verify when to stop her Anastrozole. Verified with office note from 07/23/13 for patient to stop Anastrozole at end of December. Patient verbalized understanding.

## 2014-02-12 ENCOUNTER — Encounter: Payer: Self-pay | Admitting: *Deleted

## 2014-02-16 ENCOUNTER — Other Ambulatory Visit: Payer: Self-pay

## 2014-02-16 DIAGNOSIS — C50919 Malignant neoplasm of unspecified site of unspecified female breast: Secondary | ICD-10-CM

## 2014-02-16 MED ORDER — ANASTROZOLE 1 MG PO TABS: 1.0000 mg | ORAL_TABLET | Freq: Every day | ORAL | Status: DC

## 2014-02-16 NOTE — Telephone Encounter (Signed)
Returned pt call: 30 day supply of arimidex sent to [facility].  Pt voiced understanding.

## 2014-02-22 ENCOUNTER — Encounter: Payer: Self-pay | Admitting: Internal Medicine

## 2014-02-24 ENCOUNTER — Encounter: Payer: Self-pay | Admitting: Physician Assistant

## 2014-02-24 ENCOUNTER — Ambulatory Visit (HOSPITAL_COMMUNITY)
Admission: RE | Admit: 2014-02-24 | Discharge: 2014-02-24 | Disposition: A | Discharge status: Home / Self Care | Payer: Medicare HMO | Source: Ambulatory Visit | Attending: Physician Assistant | Admitting: Physician Assistant

## 2014-02-24 ENCOUNTER — Ambulatory Visit: Payer: Self-pay | Admitting: Physician Assistant

## 2014-02-24 ENCOUNTER — Ambulatory Visit (INDEPENDENT_AMBULATORY_CARE_PROVIDER_SITE_OTHER): Payer: Medicare HMO | Admitting: Physician Assistant

## 2014-02-24 VITALS — BP 142/78 | HR 64 | Temp 97.9°F | Resp 18 | Ht 62.0 in | Wt 130.5 lb

## 2014-02-24 DIAGNOSIS — M5432 Sciatica, left side: Secondary | ICD-10-CM

## 2014-02-24 DIAGNOSIS — M5136 Other intervertebral disc degeneration, lumbar region: Secondary | ICD-10-CM | POA: Insufficient documentation

## 2014-02-24 DIAGNOSIS — I10 Essential (primary) hypertension: Secondary | ICD-10-CM

## 2014-02-24 DIAGNOSIS — M25552 Pain in left hip: Secondary | ICD-10-CM | POA: Insufficient documentation

## 2014-02-24 DIAGNOSIS — E782 Mixed hyperlipidemia: Secondary | ICD-10-CM

## 2014-02-24 DIAGNOSIS — E559 Vitamin D deficiency, unspecified: Secondary | ICD-10-CM

## 2014-02-24 DIAGNOSIS — R7309 Other abnormal glucose: Secondary | ICD-10-CM

## 2014-02-24 DIAGNOSIS — R7303 Prediabetes: Secondary | ICD-10-CM

## 2014-02-24 DIAGNOSIS — E039 Hypothyroidism, unspecified: Secondary | ICD-10-CM

## 2014-02-24 DIAGNOSIS — Z79899 Other long term (current) drug therapy: Secondary | ICD-10-CM

## 2014-02-24 LAB — BASIC METABOLIC PANEL WITH GFR
BUN: 14 mg/dL (ref 6–23)
CALCIUM: 9.9 mg/dL (ref 8.4–10.5)
CO2: 28 meq/L (ref 19–32)
CREATININE: 0.97 mg/dL (ref 0.50–1.10)
Chloride: 103 mEq/L (ref 96–112)
GFR, EST AFRICAN AMERICAN: 67 mL/min
GFR, Est Non African American: 58 mL/min — ABNORMAL LOW
GLUCOSE: 87 mg/dL (ref 70–99)
Potassium: 4.3 mEq/L (ref 3.5–5.3)
SODIUM: 140 meq/L (ref 135–145)

## 2014-02-24 LAB — CBC WITH DIFFERENTIAL/PLATELET
BASOS PCT: 1 % (ref 0–1)
Basophils Absolute: 0.1 10*3/uL (ref 0.0–0.1)
EOS ABS: 0.1 10*3/uL (ref 0.0–0.7)
Eosinophils Relative: 2 % (ref 0–5)
HEMATOCRIT: 39.6 % (ref 36.0–46.0)
Hemoglobin: 13.2 g/dL (ref 12.0–15.0)
Lymphocytes Relative: 28 % (ref 12–46)
Lymphs Abs: 1.8 10*3/uL (ref 0.7–4.0)
MCH: 29.9 pg (ref 26.0–34.0)
MCHC: 33.3 g/dL (ref 30.0–36.0)
MCV: 89.6 fL (ref 78.0–100.0)
MONO ABS: 0.7 10*3/uL (ref 0.1–1.0)
MPV: 9.2 fL — ABNORMAL LOW (ref 9.4–12.4)
Monocytes Relative: 10 % (ref 3–12)
NEUTROS PCT: 59 % (ref 43–77)
Neutro Abs: 3.8 10*3/uL (ref 1.7–7.7)
Platelets: 229 10*3/uL (ref 150–400)
RBC: 4.42 MIL/uL (ref 3.87–5.11)
RDW: 13.8 % (ref 11.5–15.5)
WBC: 6.5 10*3/uL (ref 4.0–10.5)

## 2014-02-24 LAB — HEPATIC FUNCTION PANEL
ALBUMIN: 4.5 g/dL (ref 3.5–5.2)
ALT: 17 U/L (ref 0–35)
AST: 23 U/L (ref 0–37)
Alkaline Phosphatase: 93 U/L (ref 39–117)
BILIRUBIN DIRECT: 0.1 mg/dL (ref 0.0–0.3)
Indirect Bilirubin: 0.4 mg/dL (ref 0.2–1.2)
Total Bilirubin: 0.5 mg/dL (ref 0.2–1.2)
Total Protein: 7.1 g/dL (ref 6.0–8.3)

## 2014-02-24 LAB — HEMOGLOBIN A1C
HEMOGLOBIN A1C: 5.6 % (ref <5.7)
Mean Plasma Glucose: 114 mg/dL (ref <117)

## 2014-02-24 LAB — LIPID PANEL
Cholesterol: 171 mg/dL (ref 0–200)
HDL: 50 mg/dL (ref >39)
LDL Cholesterol: 97 mg/dL (ref 0–99)
TRIGLYCERIDES: 121 mg/dL (ref <150)
Total CHOL/HDL Ratio: 3.4 Ratio
VLDL: 24 mg/dL (ref 0–40)

## 2014-02-24 LAB — TSH: TSH: 5.472 u[IU]/mL — ABNORMAL HIGH (ref 0.350–4.500)

## 2014-02-24 LAB — VITAMIN D 25 HYDROXY (VIT D DEFICIENCY, FRACTURES): VIT D 25 HYDROXY: 67 ng/mL (ref 30–100)

## 2014-02-24 LAB — CK: CK TOTAL: 52 U/L (ref 7–177)

## 2014-02-24 LAB — MAGNESIUM: Magnesium: 2 mg/dL (ref 1.5–2.5)

## 2014-02-24 MED ORDER — PREGABALIN 50 MG PO CAPS: 50.0000 mg | ORAL_CAPSULE | Freq: Three times a day (TID) | ORAL | Status: DC

## 2014-02-24 MED ORDER — PREDNISONE 20 MG PO TABS: ORAL_TABLET | ORAL | Status: DC

## 2014-02-24 NOTE — [facility] Notes (Signed)
Assessment and Plan:  Hypertension: Continue medication, monitor blood pressure at home-? From pain/NSAIDS. Continue DASH diet.  Reminder to go to the ER if any CP, SOB, nausea, dizziness, severe HA, changes vision/speech, left arm numbness and tingling, and jaw pain. Cholesterol: Continue diet and exercise. Check cholesterol.  Pre-diabetes-Continue diet and exercise. Check A1C Vitamin D Def- check level and continue medications.  Hypothyroidism-check TSH level, continue medications the same, reminded to take on an empty stomach 30-29mins before food.  Left SI pain, questionable straight leg, no bowel/bladder problems  Prednisone, RICE, and exercise given, get xray with cancer history If not better with refer to orthopedics.     Continue diet and meds as discussed. Further disposition pending results of labs.  HPI 73 y.o. female  presents for 3 month follow up with hypertension, hyperlipidemia, prediabetes and vitamin D. Her blood pressure has not been checked at home, she states that she has been taking aleve due to pain, today their BP is BP: (!) 142/78 mmHg She does workout, walks. She denies chest pain, shortness of breath, dizziness.  She is on cholesterol medication, lipitor 80mg  1/2 pill 3 days a week and denies myalgias. Her cholesterol is at goal. The cholesterol last visit was:   Lab Results  Component Value Date   CHOL 187 10/27/2013   HDL 51 10/27/2013   LDLCALC 113* 10/27/2013   TRIG 116 10/27/2013   CHOLHDL 3.7 10/27/2013    She has been working on diet and exercise for prediabetes, and denies paresthesia of the feet, polydipsia and polyuria. Last A1C in the office was:  Lab Results  Component Value Date   HGBA1C 5.5 10/27/2013   Patient is on Vitamin D supplement.   Lab Results  Component Value Date   VD25OH 70 10/27/2013     She is on thyroid medication. Her medication was not changed last visit. Patient denies fatigue, weight changes, heat/cold intolerance,  bowel/skin changes or CVS symptoms  Lab Results  Component Value Date   TSH 1.282 10/27/2013  .  History of left breast cancer, with recent normal MGM at [facility] in Oct 2015.  She states that she started to have left leg pain x 1 week after climbing up and down the steps getting christmas decorations, she has been taking tylenol which has not helped, she also took an oxycodone last night that helped. It is better if she is sitting, worse with moving, walking. Constant achy pain left leg posterior calf, posterior thigh to left buttocks. Has "buckled" on her due to pain while stepping down on stairs several times. Denies numbness, tingling in leg, denies bowel/bladder problems, denies saddle anesthesia.    Current Medications:  Current Outpatient Prescriptions on File Prior to Visit  Medication Sig Dispense Refill  . anastrozole (ARIMIDEX) 1 MG tablet Take 1 tablet (1 mg total) by mouth daily. 30 tablet 0  . atorvastatin (LIPITOR) 80 MG tablet Take 1 tablet daily or as directed for cholesterol-  Pt takes Tues Thurs & Sat    . levothyroxine (SYNTHROID, LEVOTHROID) 100 MCG tablet TAKE ONE TABLET BY MOUTH ONCE DAILY. 90 tablet 0  . loratadine (CLARITIN) 10 MG tablet Take 10 mg by mouth daily.    . Multiple Vitamins-Minerals (MULTIVITAMIN PO) Take by mouth daily.    Marland Kitchen OVER THE COUNTER MEDICATION OTC Fiber Gummies    . VITAMIN D, CHOLECALCIFEROL, PO Take 2,000 Int'l Units by mouth.     . Wheat Dextrin (BENEFIBER) POWD Take 2 scoop by mouth daily.  No current facility-administered medications on file prior to visit.   Medical History:  Past Medical History  Diagnosis Date  . Dyspnea   . GERD (gastroesophageal reflux disease)   . History of thyroid cancer   . IBS (irritable bowel syndrome)   . Sciatica     RLE  . Hypertension   . Hyperlipidemia   . Cancer 2010    Left Breast  . Vitamin D deficiency   . Pre-diabetes   . Allergy   . Thyroid disease     Hypo   Allergies:  Allergies   Allergen Reactions  . Neomycin-Bacitracin Zn-Polymyx     REACTION: rash  . Propoxyphene Hcl     REACTION: syncope  . Darvon Other (See Comments)    Almost fainted when taking, over 50 years.     Review of Systems:  ROS   Family history- Review and unchanged Social history- Review and unchanged Physical Exam: BP 142/78 mmHg  Pulse 64  Temp(Src) 97.9 F (36.6 C)  Resp 18  Wt 130 lb 8 oz (59.194 kg) Wt Readings from Last 3 Encounters:  02/24/14 130 lb 8 oz (59.194 kg)  10/27/13 130 lb (58.968 kg)  07/27/13 125 lb (56.7 kg)   General Appearance: Well nourished, in no apparent distress. Eyes: PERRLA, EOMs, conjunctiva no swelling or erythema Sinuses: No Frontal/maxillary tenderness ENT/Mouth: Ext aud canals clear, TMs without erythema, bulging. No erythema, swelling, or exudate on post pharynx.  Tonsils not swollen or erythematous. Hearing normal.  Neck: Supple, thyroid normal.  Respiratory: Respiratory effort normal, BS equal bilaterally without rales, rhonchi, wheezing or stridor.  Cardio: RRR with no MRGs. Brisk peripheral pulses without edema.  Abdomen: Soft, + BS.  Non tender, no guarding, rebound, hernias, masses. Lymphatics: Non tender without lymphadenopathy.  Musculoskeletal: Full ROM, 5/5 strength, antalgic gait. Patient is able to ambulate well.   Gait is  antalgic. Straight leg raising with dorsiflexion positive right sided, slightly questionable Sensory exam in the legs are normal.  Knee reflexes are normal Ankle reflexes are normal Strength is normal and symmetric in arms and legs. There isparaspinal muscle spasm.   There is not midline tenderness.   ROM of spine with normal flexion, extension, lateral range of motion to the right and left, and rotation to the right and left.   Skin: Warm, dry without rashes, lesions, ecchymosis.  Neuro: Cranial nerves intact. Normal muscle tone, no cerebellar symptoms. Sensation intact.  Psych: Awake and oriented X 3,  normal affect, Insight and Judgment appropriate.    Vicie Mutters, PA-C 8:59 AM [facility]

## 2014-02-24 NOTE — Patient Instructions (Signed)
Can take the lyrica samples for nerve pain. Start out 1 pill at night before bed, can increase to 2 pills at night before bed or can take 1 pill at dinner and 2 pills or can take 1 in the AM, 1 at lunch, and 1 at bed time.   Sciatica with Rehab The sciatic nerve runs from the back down the leg and is responsible for sensation and control of the muscles in the back (posterior) side of the thigh, lower leg, and foot. Sciatica is a condition that is characterized by inflammation of this nerve.  SYMPTOMS   Signs of nerve damage, including numbness and/or weakness along the posterior side of the lower extremity.  Pain in the back of the thigh that may also travel down the leg.  Pain that worsens when sitting for long periods of time.  Occasionally, pain in the back or buttock. CAUSES  Inflammation of the sciatic nerve is the cause of sciatica. The inflammation is due to something irritating the nerve. Common sources of irritation include:  Sitting for long periods of time.  Direct trauma to the nerve.  Arthritis of the spine.  Herniated or ruptured disk.  Slipping of the vertebrae (spondylolisthesis).  Pressure from soft tissues, such as muscles or ligament-like tissue (fascia). RISK INCREASES WITH:  Sports that place pressure or stress on the spine (football or weightlifting).  Poor strength and flexibility.  Failure to warm up properly before activity.  Family history of low back pain or disk disorders.  Previous back injury or surgery.  Poor body mechanics, especially when lifting, or poor posture. PREVENTION   Warm up and stretch properly before activity.  Maintain physical fitness:  Strength, flexibility, and endurance.  Cardiovascular fitness.  Learn and use proper technique, especially with posture and lifting. When possible, have coach correct improper technique.  Avoid activities that place stress on the spine. PROGNOSIS If treated properly, then sciatica  usually resolves within 6 weeks. However, occasionally surgery is necessary.  RELATED COMPLICATIONS   Permanent nerve damage, including pain, numbness, tingle, or weakness.  Chronic back pain.  Risks of surgery: infection, bleeding, nerve damage, or damage to surrounding tissues. TREATMENT Treatment initially involves resting from any activities that aggravate your symptoms. The use of ice and medication may help reduce pain and inflammation. The use of strengthening and stretching exercises may help reduce pain with activity. These exercises may be performed at home or with referral to a therapist. A therapist may recommend further treatments, such as transcutaneous electronic nerve stimulation (TENS) or ultrasound. Your caregiver may recommend corticosteroid injections to help reduce inflammation of the sciatic nerve. If symptoms persist despite non-surgical (conservative) treatment, then surgery may be recommended. MEDICATION  If pain medication is necessary, then nonsteroidal anti-inflammatory medications, such as aspirin and ibuprofen, or other minor pain relievers, such as acetaminophen, are often recommended.  Do not take pain medication for 7 days before surgery.  Prescription pain relievers may be given if deemed necessary by your caregiver. Use only as directed and only as much as you need.  Ointments applied to the skin may be helpful.  Corticosteroid injections may be given by your caregiver. These injections should be reserved for the most serious cases, because they may only be given a certain number of times. HEAT AND COLD  Cold treatment (icing) relieves pain and reduces inflammation. Cold treatment should be applied for 10 to 15 minutes every 2 to 3 hours for inflammation and pain and immediately after any activity  that aggravates your symptoms. Use ice packs or massage the area with a piece of ice (ice massage).  Heat treatment may be used prior to performing the stretching  and strengthening activities prescribed by your caregiver, physical therapist, or athletic trainer. Use a heat pack or soak the injury in warm water. SEEK MEDICAL CARE IF:  Treatment seems to offer no benefit, or the condition worsens.  Any medications produce adverse side effects. EXERCISES  RANGE OF MOTION (ROM) AND STRETCHING EXERCISES - Sciatica Most people with sciatic will find that their symptoms worsen with either excessive bending forward (flexion) or arching at the low back (extension). The exercises which will help resolve your symptoms will focus on the opposite motion. Your physician, physical therapist or athletic trainer will help you determine which exercises will be most helpful to resolve your low back pain. Do not complete any exercises without first consulting with your clinician. Discontinue any exercises which worsen your symptoms until you speak to your clinician. If you have pain, numbness or tingling which travels down into your buttocks, leg or foot, the goal of the therapy is for these symptoms to move closer to your back and eventually resolve. Occasionally, these leg symptoms will get better, but your low back pain may worsen; this is typically an indication of progress in your rehabilitation. Be certain to be very alert to any changes in your symptoms and the activities in which you participated in the 24 hours prior to the change. Sharing this information with your clinician will allow him/her to most efficiently treat your condition. These exercises may help you when beginning to rehabilitate your injury. Your symptoms may resolve with or without further involvement from your physician, physical therapist or athletic trainer. While completing these exercises, remember:   Restoring tissue flexibility helps normal motion to return to the joints. This allows healthier, less painful movement and activity.  An effective stretch should be held for at least 30 seconds.  A  stretch should never be painful. You should only feel a gentle lengthening or release in the stretched tissue. FLEXION RANGE OF MOTION AND STRETCHING EXERCISES: STRETCH - Flexion, Single Knee to Chest   Lie on a firm bed or floor with both legs extended in front of you.  Keeping one leg in contact with the floor, bring your opposite knee to your chest. Hold your leg in place by either grabbing behind your thigh or at your knee.  Pull until you feel a gentle stretch in your low back. Hold __________ seconds.  Slowly release your grasp and repeat the exercise with the opposite side. Repeat __________ times. Complete this exercise __________ times per day.  STRETCH - Flexion, Double Knee to Chest  Lie on a firm bed or floor with both legs extended in front of you.  Keeping one leg in contact with the floor, bring your opposite knee to your chest.  Tense your stomach muscles to support your back and then lift your other knee to your chest. Hold your legs in place by either grabbing behind your thighs or at your knees.  Pull both knees toward your chest until you feel a gentle stretch in your low back. Hold __________ seconds.  Tense your stomach muscles and slowly return one leg at a time to the floor. Repeat __________ times. Complete this exercise __________ times per day.  STRETCH - Low Trunk Rotation   Lie on a firm bed or floor. Keeping your legs in front of you, bend your  knees so they are both pointed toward the ceiling and your feet are flat on the floor.  Extend your arms out to the side. This will stabilize your upper body by keeping your shoulders in contact with the floor.  Gently and slowly drop both knees together to one side until you feel a gentle stretch in your low back. Hold for __________ seconds.  Tense your stomach muscles to support your low back as you bring your knees back to the starting position. Repeat the exercise to the other side. Repeat __________ times.  Complete this exercise __________ times per day  EXTENSION RANGE OF MOTION AND FLEXIBILITY EXERCISES: STRETCH - Extension, Prone on Elbows  Lie on your stomach on the floor, a bed will be too soft. Place your palms about shoulder width apart and at the height of your head.  Place your elbows under your shoulders. If this is too painful, stack pillows under your chest.  Allow your body to relax so that your hips drop lower and make contact more completely with the floor.  Hold this position for __________ seconds.  Slowly return to lying flat on the floor. Repeat __________ times. Complete this exercise __________ times per day.  RANGE OF MOTION - Extension, Prone Press Ups  Lie on your stomach on the floor, a bed will be too soft. Place your palms about shoulder width apart and at the height of your head.  Keeping your back as relaxed as possible, slowly straighten your elbows while keeping your hips on the floor. You may adjust the placement of your hands to maximize your comfort. As you gain motion, your hands will come more underneath your shoulders.  Hold this position __________ seconds.  Slowly return to lying flat on the floor. Repeat __________ times. Complete this exercise __________ times per day.  STRENGTHENING EXERCISES - Sciatica  These exercises may help you when beginning to rehabilitate your injury. These exercises should be done near your "sweet spot." This is the neutral, low-back arch, somewhere between fully rounded and fully arched, that is your least painful position. When performed in this safe range of motion, these exercises can be used for people who have either a flexion or extension based injury. These exercises may resolve your symptoms with or without further involvement from your physician, physical therapist or athletic trainer. While completing these exercises, remember:   Muscles can gain both the endurance and the strength needed for everyday activities  through controlled exercises.  Complete these exercises as instructed by your physician, physical therapist or athletic trainer. Progress with the resistance and repetition exercises only as your caregiver advises.  You may experience muscle soreness or fatigue, but the pain or discomfort you are trying to eliminate should never worsen during these exercises. If this pain does worsen, stop and make certain you are following the directions exactly. If the pain is still present after adjustments, discontinue the exercise until you can discuss the trouble with your clinician. STRENGTHENING - Deep Abdominals, Pelvic Tilt   Lie on a firm bed or floor. Keeping your legs in front of you, bend your knees so they are both pointed toward the ceiling and your feet are flat on the floor.  Tense your lower abdominal muscles to press your low back into the floor. This motion will rotate your pelvis so that your tail bone is scooping upwards rather than pointing at your feet or into the floor.  With a gentle tension and even breathing, hold this position  for __________ seconds. Repeat __________ times. Complete this exercise __________ times per day.  STRENGTHENING - Abdominals, Crunches   Lie on a firm bed or floor. Keeping your legs in front of you, bend your knees so they are both pointed toward the ceiling and your feet are flat on the floor. Cross your arms over your chest.  Slightly tip your chin down without bending your neck.  Tense your abdominals and slowly lift your trunk high enough to just clear your shoulder blades. Lifting higher can put excessive stress on the low back and does not further strengthen your abdominal muscles.  Control your return to the starting position. Repeat __________ times. Complete this exercise __________ times per day.  STRENGTHENING - Quadruped, Opposite UE/LE Lift  Assume a hands and knees position on a firm surface. Keep your hands under your shoulders and your  knees under your hips. You may place padding under your knees for comfort.  Find your neutral spine and gently tense your abdominal muscles so that you can maintain this position. Your shoulders and hips should form a rectangle that is parallel with the floor and is not twisted.  Keeping your trunk steady, lift your right hand no higher than your shoulder and then your left leg no higher than your hip. Make sure you are not holding your breath. Hold this position __________ seconds.  Continuing to keep your abdominal muscles tense and your back steady, slowly return to your starting position. Repeat with the opposite arm and leg. Repeat __________ times. Complete this exercise __________ times per day.  STRENGTHENING - Abdominals and Quadriceps, Straight Leg Raise   Lie on a firm bed or floor with both legs extended in front of you.  Keeping one leg in contact with the floor, bend the other knee so that your foot can rest flat on the floor.  Find your neutral spine, and tense your abdominal muscles to maintain your spinal position throughout the exercise.  Slowly lift your straight leg off the floor about 6 inches for a count of 15, making sure to not hold your breath.  Still keeping your neutral spine, slowly lower your leg all the way to the floor. Repeat this exercise with each leg __________ times. Complete this exercise __________ times per day. POSTURE AND BODY MECHANICS CONSIDERATIONS - Sciatica Keeping correct posture when sitting, standing or completing your activities will reduce the stress put on different body tissues, allowing injured tissues a chance to heal and limiting painful experiences. The following are general guidelines for improved posture. Your physician or physical therapist will provide you with any instructions specific to your needs. While reading these guidelines, remember:  The exercises prescribed by your provider will help you have the flexibility and strength  to maintain correct postures.  The correct posture provides the optimal environment for your joints to work. All of your joints have less wear and tear when properly supported by a spine with good posture. This means you will experience a healthier, less painful body.  Correct posture must be practiced with all of your activities, especially prolonged sitting and standing. Correct posture is as important when doing repetitive low-stress activities (typing) as it is when doing a single heavy-load activity (lifting). RESTING POSITIONS Consider which positions are most painful for you when choosing a resting position. If you have pain with flexion-based activities (sitting, bending, stooping, squatting), choose a position that allows you to rest in a less flexed posture. You would want to avoid curling  into a fetal position on your side. If your pain worsens with extension-based activities (prolonged standing, working overhead), avoid resting in an extended position such as sleeping on your stomach. Most people will find more comfort when they rest with their spine in a more neutral position, neither too rounded nor too arched. Lying on a non-sagging bed on your side with a pillow between your knees, or on your back with a pillow under your knees will often provide some relief. Keep in mind, being in any one position for a prolonged period of time, no matter how correct your posture, can still lead to stiffness. PROPER SITTING POSTURE In order to minimize stress and discomfort on your spine, you must sit with correct posture Sitting with good posture should be effortless for a healthy body. Returning to good posture is a gradual process. Many people can work toward this most comfortably by using various supports until they have the flexibility and strength to maintain this posture on their own. When sitting with proper posture, your ears will fall over your shoulders and your shoulders will fall over your  hips. You should use the back of the chair to support your upper back. Your low back will be in a neutral position, just slightly arched. You may place a small pillow or folded towel at the base of your low back for support.  When working at a desk, create an environment that supports good, upright posture. Without extra support, muscles fatigue and lead to excessive strain on joints and other tissues. Keep these recommendations in mind: CHAIR:   A chair should be able to slide under your desk when your back makes contact with the back of the chair. This allows you to work closely.  The chair's height should allow your eyes to be level with the upper part of your monitor and your hands to be slightly lower than your elbows. BODY POSITION  Your feet should make contact with the floor. If this is not possible, use a foot rest.  Keep your ears over your shoulders. This will reduce stress on your neck and low back. INCORRECT SITTING POSTURES   If you are feeling tired and unable to assume a healthy sitting posture, do not slouch or slump. This puts excessive strain on your back tissues, causing more damage and pain. Healthier options include:  Using more support, like a lumbar pillow.  Switching tasks to something that requires you to be upright or walking.  Talking a brief walk.  Lying down to rest in a neutral-spine position. PROLONGED STANDING WHILE SLIGHTLY LEANING FORWARD  When completing a task that requires you to lean forward while standing in one place for a long time, place either foot up on a stationary 2-4 inch high object to help maintain the best posture. When both feet are on the ground, the low back tends to lose its slight inward curve. If this curve flattens (or becomes too large), then the back and your other joints will experience too much stress, fatigue more quickly and can cause pain.  CORRECT STANDING POSTURES Proper standing posture should be assumed with all daily  activities, even if they only take a few moments, like when brushing your teeth. As in sitting, your ears should fall over your shoulders and your shoulders should fall over your hips. You should keep a slight tension in your abdominal muscles to brace your spine. Your tailbone should point down to the ground, not behind your body, resulting in an  over-extended swayback posture.  INCORRECT STANDING POSTURES  Common incorrect standing postures include a forward head, locked knees and/or an excessive swayback. WALKING Walk with an upright posture. Your ears, shoulders and hips should all line-up. PROLONGED ACTIVITY IN A FLEXED POSITION When completing a task that requires you to bend forward at your waist or lean over a low surface, try to find a way to stabilize 3 of 4 of your limbs. You can place a hand or elbow on your thigh or rest a knee on the surface you are reaching across. This will provide you more stability so that your muscles do not fatigue as quickly. By keeping your knees relaxed, or slightly bent, you will also reduce stress across your low back. CORRECT LIFTING TECHNIQUES DO :   Assume a wide stance. This will provide you more stability and the opportunity to get as close as possible to the object which you are lifting.  Tense your abdominals to brace your spine; then bend at the knees and hips. Keeping your back locked in a neutral-spine position, lift using your leg muscles. Lift with your legs, keeping your back straight.  Test the weight of unknown objects before attempting to lift them.  Try to keep your elbows locked down at your sides in order get the best strength from your shoulders when carrying an object.  Always ask for help when lifting heavy or awkward objects. INCORRECT LIFTING TECHNIQUES DO NOT:   Lock your knees when lifting, even if it is a small object.  Bend and twist. Pivot at your feet or move your feet when needing to change directions.  Assume that you  cannot safely pick up a paperclip without proper posture. Document Released: 03/04/2005 Document Revised: 07/19/2013 Document Reviewed: 06/16/2008 [facility] Patient Information 2015 [place], [place]. This information is not intended to replace advice given to you by your health care provider. Make sure you discuss any questions you have with your health care provider.

## 2014-03-14 ENCOUNTER — Encounter: Payer: Self-pay | Admitting: Physician Assistant

## 2014-03-14 ENCOUNTER — Ambulatory Visit (INDEPENDENT_AMBULATORY_CARE_PROVIDER_SITE_OTHER): Payer: Medicare HMO | Admitting: Physician Assistant

## 2014-03-14 VITALS — BP 122/58 | HR 74 | Temp 98.4°F | Resp 16 | Ht 61.5 in | Wt 130.0 lb

## 2014-03-14 DIAGNOSIS — J040 Acute laryngitis: Secondary | ICD-10-CM

## 2014-03-14 DIAGNOSIS — J0100 Acute maxillary sinusitis, unspecified: Secondary | ICD-10-CM

## 2014-03-14 MED ORDER — PREDNISONE 20 MG PO TABS: ORAL_TABLET | ORAL | Status: AC

## 2014-03-14 MED ORDER — AZITHROMYCIN 250 MG PO TABS: ORAL_TABLET | ORAL | Status: AC

## 2014-03-14 MED ORDER — PROMETHAZINE-CODEINE 6.25-10 MG/5ML PO SYRP: 5.0000 mL | ORAL_SOLUTION | Freq: Three times a day (TID) | ORAL | Status: AC | PRN

## 2014-03-14 NOTE — Patient Instructions (Addendum)
-Take Z-Pak as prescribed -Take the Prednisone as prescribed for inflammation -Take Promethazine-Codeine as prescribed for cough Continue the Tums OTC for GI upset  Make sure you are drinking plenty of water to stay hydrated. Do NOT take Ibuprofen, Advil, Aleve or Naproxen over the counter while on Mobic.   If you are not feeling better 10-14 days, then please call the office.   Sinusitis Sinusitis is redness, soreness, and inflammation of the paranasal sinuses. Paranasal sinuses are air pockets within the bones of your face (beneath the eyes, the middle of the forehead, or above the eyes). In healthy paranasal sinuses, mucus is able to drain out, and air is able to circulate through them by way of your nose. However, when your paranasal sinuses are inflamed, mucus and air can become trapped. This can allow bacteria and other germs to grow and cause infection. Sinusitis can develop quickly and last only a short time (acute) or continue over a long period (chronic). Sinusitis that lasts for more than 12 weeks is considered chronic.  CAUSES  Causes of sinusitis include:  Allergies.  Structural abnormalities, such as displacement of the cartilage that separates your nostrils (deviated septum), which can decrease the air flow through your nose and sinuses and affect sinus drainage.  Functional abnormalities, such as when the small hairs (cilia) that line your sinuses and help remove mucus do not work properly or are not present. SIGNS AND SYMPTOMS  Symptoms of acute and chronic sinusitis are the same. The primary symptoms are pain and pressure around the affected sinuses. Other symptoms include:  Upper toothache.  Earache.  Headache.  Bad breath.  Decreased sense of smell and taste.  A cough, which worsens when you are lying flat.  Fatigue.  Fever.  Thick drainage from your nose, which often is green and may contain pus (purulent).  Swelling and warmth over the affected  sinuses. DIAGNOSIS  Your health care provider will perform a physical exam. During the exam, your health care provider may:  Look in your nose for signs of abnormal growths in your nostrils (nasal polyps).  Tap over the affected sinus to check for signs of infection.  View the inside of your sinuses (endoscopy) using an imaging device that has a light attached (endoscope). If your health care provider suspects that you have chronic sinusitis, one or more of the following tests may be recommended:  Allergy tests.  Nasal culture. A sample of mucus is taken from your nose, sent to a lab, and screened for bacteria.  Nasal cytology. A sample of mucus is taken from your nose and examined by your health care provider to determine if your sinusitis is related to an allergy. TREATMENT  Most cases of acute sinusitis are related to a viral infection and will resolve on their own within 10 days. Sometimes medicines are prescribed to help relieve symptoms (pain medicine, decongestants, nasal steroid sprays, or saline sprays).  However, for sinusitis related to a bacterial infection, your health care provider will prescribe antibiotic medicines. These are medicines that will help kill the bacteria causing the infection.  Rarely, sinusitis is caused by a fungal infection. In theses cases, your health care provider will prescribe antifungal medicine. For some cases of chronic sinusitis, surgery is needed. Generally, these are cases in which sinusitis recurs more than 3 times per year, despite other treatments. HOME CARE INSTRUCTIONS   Drink plenty of water. Water helps thin the mucus so your sinuses can drain more easily.  Use a  humidifier.  Inhale steam 3 to 4 times a day (for example, sit in the bathroom with the shower running).  Apply a warm, moist washcloth to your face 3 to 4 times a day, or as directed by your health care provider.  Use saline nasal sprays to help moisten and clean your  sinuses.  Take medicines only as directed by your health care provider.  If you were prescribed either an antibiotic or antifungal medicine, finish it all even if you start to feel better. SEEK IMMEDIATE MEDICAL CARE IF:  You have increasing pain or severe headaches.  You have nausea, vomiting, or drowsiness.  You have swelling around your face.  You have vision problems.  You have a stiff neck.  You have difficulty breathing. MAKE SURE YOU:   Understand these instructions.  Will watch your condition.  Will get help right away if you are not doing well or get worse. Document Released: 03/04/2005 Document Revised: 07/19/2013 Document Reviewed: 03/19/2011 [facility] Patient Information 2015 [place], [place]. This information is not intended to replace advice given to you by your health care provider. Make sure you discuss any questions you have with your health care provider.

## 2014-03-14 NOTE — Progress Notes (Signed)
Subjective:    Patient ID: Haley Shah, female    DOB: May 23, 1940, 73 y.o.   MRN: [id number]  Cough This is a new problem. Episode onset: 1 week ago. The problem has been gradually worsening. The problem occurs constantly. Cough characteristics: clear mucus. Associated symptoms include rhinorrhea, a sore throat and wheezing. Pertinent negatives include no ear pain, postnasal drip, rash or shortness of breath. The symptoms are aggravated by lying down. Treatments tried: OTC cold med.  Never smoked GFR= 58 on 02/24/14 Review of Systems  Constitutional: Negative.   HENT: Positive for congestion, rhinorrhea, sore throat and voice change. Negative for ear discharge, ear pain, postnasal drip, sinus pressure and trouble swallowing.   Eyes: Negative.   Respiratory: Positive for cough and wheezing. Negative for chest tightness and shortness of breath.   Cardiovascular: Negative.   Gastrointestinal: Negative.   Genitourinary: Negative.   Musculoskeletal: Negative.   Skin: Negative.  Negative for rash.  Neurological: Negative.    Past Medical History  Diagnosis Date  . Dyspnea   . GERD (gastroesophageal reflux disease)   . History of thyroid cancer   . IBS (irritable bowel syndrome)   . Sciatica     RLE  . Hypertension   . Hyperlipidemia   . Cancer 2010    Left Breast  . Vitamin D deficiency   . Pre-diabetes   . Allergy   . Thyroid disease     Hypo   Current Outpatient Prescriptions on File Prior to Visit  Medication Sig Dispense Refill  . anastrozole (ARIMIDEX) 1 MG tablet Take 1 tablet (1 mg total) by mouth daily. 30 tablet 0  . atorvastatin (LIPITOR) 80 MG tablet Take 1 tablet daily or as directed for cholesterol-  Pt takes Tues Thurs & Sat    . levothyroxine (SYNTHROID, LEVOTHROID) 100 MCG tablet TAKE ONE TABLET BY MOUTH ONCE DAILY. 90 tablet 0  . loratadine (CLARITIN) 10 MG tablet Take 10 mg by mouth daily.    . Multiple Vitamins-Minerals (MULTIVITAMIN PO) Take by mouth  daily.    Marland Kitchen OVER THE COUNTER MEDICATION OTC Fiber Gummies    . VITAMIN D, CHOLECALCIFEROL, PO Take 2,000 Int'l Units by mouth.     . Wheat Dextrin (BENEFIBER) POWD Take 2 scoop by mouth daily.     No current facility-administered medications on file prior to visit.   Allergies  Allergen Reactions  . Neomycin-Bacitracin Zn-Polymyx     REACTION: rash  . Propoxyphene Hcl     REACTION: syncope  . Darvon Other (See Comments)    Almost fainted when taking, over 50 years.      BP 122/58 mmHg  Pulse 74  Temp(Src) 98.4 F (36.9 C) (Temporal)  Resp 16  Ht 5' 1.5" (1.562 m)  Wt 130 lb (58.968 kg)  BMI 24.17 kg/m2  SpO2 98% Wt Readings from Last 3 Encounters:  03/14/14 130 lb (58.968 kg)  02/24/14 130 lb 8 oz (59.194 kg)  10/27/13 130 lb (58.968 kg)   Objective:   Physical Exam  Constitutional: She is oriented to person, place, and time. She appears well-developed and well-nourished. She has a sickly appearance. No distress.  HENT:  Head: Normocephalic.  Right Ear: Tympanic membrane, external ear and ear canal normal.  Left Ear: Tympanic membrane, external ear and ear canal normal.  Nose: Mucosal edema and rhinorrhea present. Right sinus exhibits maxillary sinus tenderness. Right sinus exhibits no frontal sinus tenderness. Left sinus exhibits maxillary sinus tenderness. Left sinus exhibits no frontal sinus  tenderness.  Mouth/Throat: Uvula is midline and mucous membranes are normal. Mucous membranes are not pale and not dry. No trismus in the jaw. No uvula swelling. Posterior oropharyngeal erythema present. No oropharyngeal exudate, posterior oropharyngeal edema or tonsillar abscesses.  Turbinates erythematous bilaterally.  Eyes: Conjunctivae and lids are normal. Pupils are equal, round, and reactive to light. Right eye exhibits no discharge. Left eye exhibits no discharge. No scleral icterus.  Neck: Trachea normal and normal range of motion. Neck supple. No tracheal deviation present.   Raspy voice  Cardiovascular: Normal rate, regular rhythm, S1 normal, S2 normal, normal heart sounds and normal pulses.  Exam reveals no gallop, no distant heart sounds and no friction rub.   No murmur heard. Pulmonary/Chest: Effort normal and breath sounds normal. No stridor. No respiratory distress. She has no decreased breath sounds. She has no wheezes. She has no rhonchi. She has no rales. She exhibits no tenderness.  Abdominal: Soft. Bowel sounds are normal. She exhibits no distension and no mass. There is no tenderness. There is no rebound and no guarding.  Lymphadenopathy:  No tenderness or LAD.  Neurological: She is alert and oriented to person, place, and time. Gait normal.  Skin: Skin is warm, dry and intact. No rash noted. She is not diaphoretic.  Psychiatric: She has a normal mood and affect. Her speech is normal and behavior is normal. Judgment and thought content normal. Cognition and memory are normal.  Vitals reviewed.  Assessment & Plan:  1. Acute maxillary sinusitis, recurrence not specified/ Laryngitis -Take Z-Pak as prescribed- azithromycin (ZITHROMAX) 250 MG tablet; Take 2 tablets PO on day 1, then 1 tablet PO Q24H x 4 days  Dispense: 6 tablet; Refill: 1 -Take Prednisone as prescribed for inflammation- predniSONE (DELTASONE) 20 MG tablet; Take 3 tablets PO QDaily for 3 days, then take 2 tablets PO QDaily for 3 days, then take 1 tablet PO QDaily for 3 days  Dispense: 18 tablet; Refill: 0 -Take Promethazine-Codeine as prescribed for cough- promethazine-codeine (PHENERGAN WITH CODEINE) 6.25-10 MG/5ML syrup; Take 5 mLs by mouth every 8 (eight) hours as needed for cough. Max: 50mL per day  Dispense: 240 mL; Refill: 0   Discussed medication effects and SE's.  Pt agreed to treatment plan. If you are not feeling better in 10-14 days, then please call the office. Please keep your physical appt on 06/08/14.  Ananth Fiallos, Stephani Police, PA-C 12:01 PM [facility]

## 2014-03-23 ENCOUNTER — Other Ambulatory Visit: Payer: Self-pay | Admitting: Internal Medicine

## 2014-04-21 ENCOUNTER — Encounter: Payer: Self-pay | Admitting: Physician Assistant

## 2014-04-21 ENCOUNTER — Ambulatory Visit (INDEPENDENT_AMBULATORY_CARE_PROVIDER_SITE_OTHER): Payer: Medicare HMO | Admitting: Physician Assistant

## 2014-04-21 VITALS — BP 140/80 | HR 64 | Temp 98.1°F | Resp 16 | Ht 61.5 in | Wt 134.0 lb

## 2014-04-21 DIAGNOSIS — M25562 Pain in left knee: Secondary | ICD-10-CM

## 2014-04-21 DIAGNOSIS — M7989 Other specified soft tissue disorders: Secondary | ICD-10-CM

## 2014-04-21 NOTE — Patient Instructions (Addendum)
Pes anserinus pain syndrome - The anserine bursa is located about two inches below the top of the tibia (shin bone), on the inner side of the knee. Pes anserinus pain syndrome (formerly referred to as anserine bursitis) causes pain on the inner side of the knee, which tends to come on abruptly, often during the night. It is common in people with arthritis of the knee; it can also result from an uneven gait (for example, walking with a limp or while favoring one leg over time), or from flat feet.  BURSITIS TREATMENT - Bursitis treatment focuses on relieving inflammation and pain, treating infection (if present), and preventing complications and future recurrence. Medication - In most cases, nonsteroidal antiinflammatory drugs (NSAIDs) can help relieve pain and inflammation. NSAIDs include ibuprofen (sample brand names: Advil, Motrin) and naproxen (sample brand name: Aleve); other NSAIDs, as well as higher doses, are also available by prescription. (See "Patient information: Nonsteroidal antiinflammatory drugs (NSAIDs) (Beyond the Basics)".) A glucocorticoid (steroid) injection can also help with inflammation. This is more often used when the affected area is deep under the skin (see 'Shoulder (subacromial bursitis)' above and 'Greater trochanteric pain syndrome (formerly called trochanteric bursitis)' above and 'MCL bursitis' above and 'Pes anserinus pain syndrome' above). It is not usually helpful for more superficial types of bursitis, for example, in the olecranon bursa of the elbow, prepatellar bursa of the knee, or retrocalcaneal bursa of the heel. Septic bursitis requires drainage of the infected fluid and antibiotics to treat the underlying infection. (See 'Treating infection' below.) Protecting the joints - It is important to protect the affected joints in order to help the bursae to heal, and to prevent the bursitis from getting worse or recurring. Examples of joint protection include: ?Avoiding or  modifying activities that cause pain ?The use of pads or cushions for people who have to kneel or sit frequently ?Modifying footwear to reduce pressure on the back of the heel (eg, cutting a "V"-shaped groove into the back of a shoe; using a pad inside the shoe to lift the heel) ?Custom-fitted devices worn over the elbows to protect them and prevent fluid from building up again Other measures - Ice can help relieve pain, particularly for bursitis affecting superficial areas like the elbow, kneecap, and heel. Heat (eg, a heating pad) may be more effective for deeper forms of bursitis, such as the hip, shoulder, or inner knee. In many cases, physical therapy can help treat symptoms of bursitis and prevent future recurrence. The optimal exercises depend on the type and severity of bursitis, but may involve stretching, strengthening, or working to improve (and maintain) range of motion. Rarely, surgery is required to remove all or part of the affected bursa. Treating infection - Septic bursitis is treated with antibiotics. The choice of which antibiotic to use, and for how long, is based on the type and severity of infection. For mild cases, a few weeks of oral antibiotics may be enough; for more severe infection, intravenous (IV) antibiotics (given in the hospital) may be required. It is also often necessary to drain infected fluid using a needle. This is done in a doctor's office, usually several times, until the infection has resolved.   Deep Vein Thrombosis A deep vein thrombosis (DVT) is a blood clot that develops in the deep, larger veins of the leg, arm, or pelvis. These are more dangerous than clots that might form in veins near the surface of the body. A DVT can lead to serious and even life-threatening complications  if the clot breaks off and travels in the bloodstream to the lungs.  A DVT can damage the valves in your leg veins so that instead of flowing upward, the blood pools in the lower leg.  This is called post-thrombotic syndrome, and it can result in pain, swelling, discoloration, and sores on the leg. CAUSES Usually, several things contribute to the formation of blood clots. Contributing factors include:  The flow of blood slows down.  The inside of the vein is damaged in some way.  You have a condition that makes blood clot more easily. RISK FACTORS Some people are more likely than others to develop blood clots. Risk factors include:   Smoking.  Being overweight (obese).  Sitting or lying still for a long time. This includes long-distance travel, paralysis, or recovery from an illness or surgery. Other factors that increase risk are:   Older age, especially over 74 years of age.  Having a family history of blood clots or if you have already had a blot clot.  Having major or lengthy surgery. This is especially true for surgery on the hip, knee, or belly (abdomen). Hip surgery is particularly high risk.  Having a long, thin tube (catheter) placed inside a vein during a medical procedure.  Breaking a hip or leg.  Having cancer or cancer treatment.  Pregnancy and childbirth.  Hormone changes make the blood clot more easily during pregnancy.  The fetus puts pressure on the veins of the pelvis.  There is a risk of injury to veins during delivery or a caesarean delivery. The risk is highest just after childbirth.  Medicines containing the female hormone estrogen. This includes birth control pills and hormone replacement therapy.  Other circulation or heart problems.  SIGNS AND SYMPTOMS When a clot forms, it can either partially or totally block the blood flow in that vein. Symptoms of a DVT can include:  Swelling of the leg or arm, especially if one side is much worse.  Warmth and redness of the leg or arm, especially if one side is much worse.  Pain in an arm or leg. If the clot is in the leg, symptoms may be more noticeable or worse when standing or  walking. The symptoms of a DVT that has traveled to the lungs (pulmonary embolism, PE) usually start suddenly and include:  Shortness of breath.  Coughing.  Coughing up blood or blood-tinged mucus.  Chest pain. The chest pain is often worse with deep breaths.  Rapid heartbeat. Anyone with these symptoms should get emergency medical treatment right away. Do not wait to see if the symptoms will go away. Call your local emergency services (911 in the [place] if you have these symptoms. Do not drive yourself to the hospital. DIAGNOSIS If a DVT is suspected, your health care provider will take a full medical history and perform a physical exam. Tests that also may be required include:  Blood tests, including studies of the clotting properties of the blood.  Ultrasound to see if you have clots in your legs or lungs.  X-rays to show the flow of blood when dye is injected into the veins (venogram).  Studies of your lungs if you have any chest symptoms. PREVENTION  Exercise the legs regularly. Take a brisk 30-minute walk every day.  Maintain a weight that is appropriate for your height.  Avoid sitting or lying in bed for long periods of time without moving your legs.  Women, particularly those over the age of 73 years,  should consider the risks and benefits of taking estrogen medicines, including birth control pills.  Do not smoke, especially if you take estrogen medicines.  Long-distance travel can increase your risk of DVT. You should exercise your legs by walking or pumping the muscles every hour.  Many of the risk factors above relate to situations that exist with hospitalization, either for illness, injury, or elective surgery. Prevention may include medical and nonmedical measures.  Your health care provider will assess you for the need for venous thromboembolism prevention when you are admitted to the hospital. If you are having surgery, your surgeon will assess you the day of or  day after surgery. TREATMENT Once identified, a DVT can be treated. It can also be prevented in some circumstances. Once you have had a DVT, you may be at increased risk for a DVT in the future. The most common treatment for DVT is blood-thinning (anticoagulant) medicine, which reduces the blood's tendency to clot. Anticoagulants can stop new blood clots from forming and stop old clots from growing. They cannot dissolve existing clots. Your body does this by itself over time. Anticoagulants can be given by mouth, through an IV tube, or by injection. Your health care provider will determine the best program for you. Other medicines or treatments that may be used are:  Heparin or related medicines (low molecular weight heparin) are often the first treatment for a blood clot. They act quickly. However, they cannot be taken orally and must be given either in shot form or by IV tube.  Heparin can cause a fall in a component of blood that stops bleeding and forms blood clots (platelets). You will be monitored with blood tests to be sure this does not occur.  Warfarin is an anticoagulant that can be swallowed. It takes a few days to start working, so usually heparin or related medicines are used in combination. Once warfarin is working, heparin is usually stopped.  Factor Xa inhibitor medicines, such as rivaroxaban and apixaban, also reduce blood clotting. These medicines are taken orally and can often be used without heparin or related medicines.  Less commonly, clot dissolving drugs (thrombolytics) are used to dissolve a DVT. They carry a high risk of bleeding, so they are used mainly in severe cases where your life or a part of your body is threatened.  Very rarely, a blood clot in the leg needs to be removed surgically.  If you are unable to take anticoagulants, your health care provider may arrange for you to have a filter placed in a main vein in your abdomen. This filter prevents clots from traveling  to your lungs. HOME CARE INSTRUCTIONS  Take all medicines as directed by your health care provider.  Learn as much as you can about DVT.  Wear a medical alert bracelet or carry a medical alert card.  Ask your health care provider how soon you can go back to normal activities. It is important to stay active to prevent blood clots. If you are on anticoagulant medicine, avoid contact sports.  It is very important to exercise. This is especially important while traveling, sitting, or standing for long periods of time. Exercise your legs by walking or by tightening and relaxing your leg muscles regularly. Take frequent walks.  You may need to wear compression stockings. These are tight elastic stockings that apply pressure to the lower legs. This pressure can help keep the blood in the legs from clotting. Taking Warfarin Warfarin is a daily medicine that is  taken by mouth. Your health care provider will advise you on the length of treatment (usually 3-6 months, sometimes lifelong). If you take warfarin:  Understand how to take warfarin and foods that can affect how warfarin works in Veterinary surgeon.  Too much and too little warfarin are both dangerous. Too much warfarin increases the risk of bleeding. Too little warfarin continues to allow the risk for blood clots. Warfarin and Regular Blood Testing While taking warfarin, you will need to have regular blood tests to measure your blood clotting time. These blood tests usually include both the prothrombin time (PT) and international normalized ratio (INR) tests. The PT and INR results allow your health care provider to adjust your dose of warfarin. It is very important that you have your PT and INR tested as often as directed by your health care provider.  Warfarin and Your Diet Avoid major changes in your diet, or notify your health care provider before changing your diet. Arrange a visit with a registered dietitian to answer your questions. Many foods,  especially foods high in vitamin K, can interfere with warfarin and affect the PT and INR results. You should eat a consistent amount of foods high in vitamin K. Foods high in vitamin K include:   Spinach, kale, broccoli, cabbage, collard and turnip greens, Brussels sprouts, peas, cauliflower, seaweed, and parsley.  Beef and pork liver.  Green tea.  Soybean oil. Warfarin with Other Medicines Many medicines can interfere with warfarin and affect the PT and INR results. You must:  Tell your health care provider about any and all medicines, vitamins, and supplements you take, including aspirin and other over-the-counter anti-inflammatory medicines. Be especially cautious with aspirin and anti-inflammatory medicines. Ask your health care provider before taking these.  Do not take or discontinue any prescribed or over-the-counter medicine except on the advice of your health care provider or pharmacist. Warfarin Side Effects Warfarin can have side effects, such as easy bruising and difficulty stopping bleeding. Ask your health care provider or pharmacist about other side effects of warfarin. You will need to:  Hold pressure over cuts for longer than usual.  Notify your dentist and other health care providers that you are taking warfarin before you undergo any procedures where bleeding may occur. Warfarin with Alcohol and Tobacco   Drinking alcohol frequently can increase the effect of warfarin, leading to excess bleeding. It is best to avoid alcoholic drinks or to consume only very small amounts while taking warfarin. Notify your health care provider if you change your alcohol intake.   Do not use any tobacco products including cigarettes, chewing tobacco, or electronic cigarettes. If you smoke, quit. Ask your health care provider for help with quitting smoking. Alternative Medicines to Warfarin: Factor Xa Inhibitor Medicines  These blood-thinning medicines are taken by mouth, usually for  several weeks or longer. It is important to take the medicine every single day at the same time each day.  There are no regular blood tests required when using these medicines.  There are fewer food and drug interactions than with warfarin.  The side effects of this class of medicine are similar to those of warfarin, including excessive bruising or bleeding. Ask your health care provider or pharmacist about other potential side effects. SEEK MEDICAL CARE IF:  You notice a rapid heartbeat.  You feel weaker or more tired than usual.  You feel faint.  You notice increased bruising.  You feel your symptoms are not getting better in the time expected.  You believe you are having side effects of medicine. SEEK IMMEDIATE MEDICAL CARE IF:  You have chest pain.  You have trouble breathing.  You have new or increased swelling or pain in one leg.  You cough up blood.  You notice blood in vomit, in a bowel movement, or in urine. MAKE SURE YOU:  Understand these instructions.  Will watch your condition.  Will get help right away if you are not doing well or get worse. Document Released: 03/04/2005 Document Revised: 07/19/2013 Document Reviewed: 11/09/2012 [facility] Patient Information 2015 [place], [place]. This information is not intended to replace advice given to you by your health care provider. Make sure you discuss any questions you have with your health care provider.

## 2014-04-21 NOTE — Progress Notes (Addendum)
   Subjective:    Patient ID: Haley Shah, female    DOB: 1941-03-08, 74 y.o.   MRN: [id number]  HPI 74 y.o. WF that presents with left leg swelling. Just drove up from [facility] yesterday when started to have swelling/tightness in her left leg. Denies warmth, redness, tenderness. Does have pain at her lateral left knee.  She has been following with an ortho PA for left knee pain, had knee infection and hip injection which helped some.   Patient has the following risk for DVT/PE long duration of automobile or plane travel, no other risk factors.   Review of Systems  Constitutional: Negative.  Negative for fever and chills.  Respiratory: Negative.  Negative for cough, chest tightness and shortness of breath.   Cardiovascular: Positive for leg swelling. Negative for chest pain and palpitations.  Musculoskeletal: Positive for joint swelling, arthralgias and gait problem. Negative for myalgias, back pain, neck pain and neck stiffness.  Skin: Negative.  Negative for rash.  Neurological: Negative.  Negative for dizziness.  Hematological: Negative.       Objective:   Physical Exam  Constitutional: She is oriented to person, place, and time. She appears well-developed and well-nourished.  Cardiovascular: Normal rate and regular rhythm.   Pulmonary/Chest: Effort normal and breath sounds normal.  Musculoskeletal: Normal range of motion.  Mild swelling and tenderness at left medial knee at bursa, without any distal swelling, erythema, warmth, tenderness.   Neurological: She is alert and oriented to person, place, and time.  Skin: Skin is warm and dry. No rash noted.       Assessment & Plan:  Left leg swelling- without erythema, warmth, distal edema- other than recent travel low risk DVT, will check Ddimer if + will get [place]- more likely pes anserine bursitis- given information and will follow up ortho.   Addendum: + Ddimer 1.18, will schedule [place] left leg to rule out DVT.

## 2014-04-22 ENCOUNTER — Ambulatory Visit (HOSPITAL_COMMUNITY)
Admission: RE | Admit: 2014-04-22 | Discharge: 2014-04-22 | Disposition: A | Discharge status: Home / Self Care | Payer: Medicare HMO | Source: Ambulatory Visit | Attending: Physician Assistant | Admitting: Physician Assistant

## 2014-04-22 ENCOUNTER — Other Ambulatory Visit (HOSPITAL_COMMUNITY): Payer: Self-pay | Admitting: Physician Assistant

## 2014-04-22 DIAGNOSIS — M7989 Other specified soft tissue disorders: Secondary | ICD-10-CM

## 2014-04-22 DIAGNOSIS — M79609 Pain in unspecified limb: Secondary | ICD-10-CM

## 2014-04-22 DIAGNOSIS — M79605 Pain in left leg: Secondary | ICD-10-CM | POA: Diagnosis not present

## 2014-04-22 LAB — D-DIMER, QUANTITATIVE: D-Dimer, Quant: 1.14 ug/mL-FEU — ABNORMAL HIGH (ref 0.00–0.48)

## 2014-04-22 NOTE — Progress Notes (Signed)
VASCULAR LAB PRELIMINARY  PRELIMINARY  PRELIMINARY  PRELIMINARY  Left lower extremity venous duplex completed.    Preliminary report:  Left:  No evidence of DVT, superficial thrombosis, or Baker's cyst.  Maribel Hadley, RVS 04/22/2014, 4:27 PM

## 2014-04-22 NOTE — Addendum Note (Signed)
Addended by: Vicie Mutters R on: 04/22/2014 08:29 AM   Modules accepted: Orders

## 2014-04-22 NOTE — Addendum Note (Signed)
Addended by: Vicie Mutters R on: 04/22/2014 10:19 AM   Modules accepted: Orders

## 2014-04-25 ENCOUNTER — Encounter: Payer: Self-pay | Admitting: Internal Medicine

## 2014-04-26 ENCOUNTER — Other Ambulatory Visit: Payer: Medicare HMO

## 2014-05-04 ENCOUNTER — Other Ambulatory Visit: Payer: Self-pay | Admitting: Orthopedic Surgery

## 2014-05-04 DIAGNOSIS — M25562 Pain in left knee: Secondary | ICD-10-CM

## 2014-05-12 ENCOUNTER — Ambulatory Visit
Admission: RE | Admit: 2014-05-12 | Discharge: 2014-05-12 | Disposition: A | Discharge status: Home / Self Care | Payer: Medicare HMO | Source: Ambulatory Visit | Attending: Orthopedic Surgery | Admitting: Orthopedic Surgery

## 2014-05-12 DIAGNOSIS — M25562 Pain in left knee: Secondary | ICD-10-CM

## 2014-05-19 ENCOUNTER — Encounter (HOSPITAL_BASED_OUTPATIENT_CLINIC_OR_DEPARTMENT_OTHER): Payer: Self-pay | Admitting: *Deleted

## 2014-05-19 NOTE — Progress Notes (Signed)
No labs needed

## 2014-05-23 ENCOUNTER — Other Ambulatory Visit: Payer: Self-pay | Admitting: Orthopedic Surgery

## 2014-05-25 ENCOUNTER — Ambulatory Visit (HOSPITAL_BASED_OUTPATIENT_CLINIC_OR_DEPARTMENT_OTHER): Payer: Medicare HMO | Admitting: Anesthesiology

## 2014-05-25 ENCOUNTER — Encounter (HOSPITAL_BASED_OUTPATIENT_CLINIC_OR_DEPARTMENT_OTHER): Payer: Self-pay | Admitting: Anesthesiology

## 2014-05-25 ENCOUNTER — Ambulatory Visit (HOSPITAL_BASED_OUTPATIENT_CLINIC_OR_DEPARTMENT_OTHER)
Admission: RE | Admit: 2014-05-25 | Discharge: 2014-05-25 | Disposition: A | Discharge status: Home / Self Care | Payer: Medicare HMO | Source: Ambulatory Visit | Attending: Orthopedic Surgery | Admitting: Orthopedic Surgery

## 2014-05-25 ENCOUNTER — Encounter (HOSPITAL_BASED_OUTPATIENT_CLINIC_OR_DEPARTMENT_OTHER)
Admission: RE | Disposition: A | Discharge status: Home / Self Care | Payer: Self-pay | Source: Ambulatory Visit | Attending: Orthopedic Surgery

## 2014-05-25 DIAGNOSIS — I10 Essential (primary) hypertension: Secondary | ICD-10-CM | POA: Insufficient documentation

## 2014-05-25 DIAGNOSIS — M23252 Derangement of posterior horn of lateral meniscus due to old tear or injury, left knee: Secondary | ICD-10-CM | POA: Diagnosis not present

## 2014-05-25 DIAGNOSIS — E785 Hyperlipidemia, unspecified: Secondary | ICD-10-CM | POA: Diagnosis not present

## 2014-05-25 DIAGNOSIS — M23222 Derangement of posterior horn of medial meniscus due to old tear or injury, left knee: Secondary | ICD-10-CM | POA: Diagnosis not present

## 2014-05-25 DIAGNOSIS — Z881 Allergy status to other antibiotic agents status: Secondary | ICD-10-CM | POA: Diagnosis not present

## 2014-05-25 DIAGNOSIS — K219 Gastro-esophageal reflux disease without esophagitis: Secondary | ICD-10-CM | POA: Insufficient documentation

## 2014-05-25 DIAGNOSIS — Z853 Personal history of malignant neoplasm of breast: Secondary | ICD-10-CM | POA: Diagnosis not present

## 2014-05-25 DIAGNOSIS — M23204 Derangement of unspecified medial meniscus due to old tear or injury, left knee: Secondary | ICD-10-CM | POA: Diagnosis present

## 2014-05-25 DIAGNOSIS — Z9861 Coronary angioplasty status: Secondary | ICD-10-CM | POA: Diagnosis not present

## 2014-05-25 DIAGNOSIS — E039 Hypothyroidism, unspecified: Secondary | ICD-10-CM | POA: Diagnosis not present

## 2014-05-25 DIAGNOSIS — Z85850 Personal history of malignant neoplasm of thyroid: Secondary | ICD-10-CM | POA: Insufficient documentation

## 2014-05-25 DIAGNOSIS — E559 Vitamin D deficiency, unspecified: Secondary | ICD-10-CM | POA: Insufficient documentation

## 2014-05-25 DIAGNOSIS — Z888 Allergy status to other drugs, medicaments and biological substances status: Secondary | ICD-10-CM | POA: Diagnosis not present

## 2014-05-25 DIAGNOSIS — H409 Unspecified glaucoma: Secondary | ICD-10-CM | POA: Insufficient documentation

## 2014-05-25 HISTORY — PX: KNEE ARTHROSCOPY WITH LATERAL MENISECTOMY: SHX6193

## 2014-05-25 HISTORY — PX: KNEE ARTHROSCOPY WITH MEDIAL MENISECTOMY: SHX5651

## 2014-05-25 HISTORY — DX: Presence of spectacles and contact lenses: Z97.3

## 2014-05-25 HISTORY — PX: CHONDROPLASTY: SHX5177

## 2014-05-25 HISTORY — DX: Unspecified glaucoma: H40.9

## 2014-05-25 SURGERY — ARTHROSCOPY, KNEE, WITH MEDIAL MENISCECTOMY
Anesthesia: General | Site: Knee | Laterality: Left

## 2014-05-25 MED ORDER — CEFAZOLIN SODIUM-DEXTROSE 2-3 GM-% IV SOLR: 2.0000 g | INTRAVENOUS | Status: DC

## 2014-05-25 MED ORDER — OXYCODONE HCL 5 MG PO TABS
5.0000 mg | ORAL_TABLET | Freq: Once | ORAL | Status: AC | PRN
  Administered 2014-05-25: 5 mg via ORAL

## 2014-05-25 MED ORDER — ONDANSETRON HCL 4 MG/2ML IJ SOLN: 4.0000 mg | Freq: Once | INTRAMUSCULAR | Status: DC | PRN

## 2014-05-25 MED ORDER — PROPOFOL 10 MG/ML IV BOLUS
INTRAVENOUS | Status: DC | PRN
  Administered 2014-05-25: 150 mg via INTRAVENOUS
  Administered 2014-05-25: 50 mg via INTRAVENOUS

## 2014-05-25 MED ORDER — CHLORHEXIDINE GLUCONATE 4 % EX LIQD: 60.0000 mL | Freq: Once | CUTANEOUS | Status: DC

## 2014-05-25 MED ORDER — FENTANYL CITRATE 0.05 MG/ML IJ SOLN
INTRAMUSCULAR | Status: AC
  Filled 2014-05-25: qty 6

## 2014-05-25 MED ORDER — EPINEPHRINE HCL 1 MG/ML IJ SOLN
INTRAMUSCULAR | Status: DC | PRN
  Administered 2014-05-25: 2 mg

## 2014-05-25 MED ORDER — CEFAZOLIN SODIUM-DEXTROSE 2-3 GM-% IV SOLR
INTRAVENOUS | Status: AC
  Filled 2014-05-25: qty 50

## 2014-05-25 MED ORDER — SODIUM CHLORIDE 0.9 % IR SOLN
Status: DC | PRN
  Administered 2014-05-25: 6000 mL

## 2014-05-25 MED ORDER — BUPIVACAINE HCL 0.5 % IJ SOLN
INTRAMUSCULAR | Status: DC | PRN
Start: 2014-05-25 — End: 2014-05-25
  Administered 2014-05-25: 20 mL

## 2014-05-25 MED ORDER — FENTANYL CITRATE 0.05 MG/ML IJ SOLN: 50.0000 ug | INTRAMUSCULAR | Status: DC | PRN

## 2014-05-25 MED ORDER — OXYCODONE HCL 5 MG PO TABS
ORAL_TABLET | ORAL | Status: AC
  Filled 2014-05-25: qty 1

## 2014-05-25 MED ORDER — FENTANYL CITRATE 0.05 MG/ML IJ SOLN
INTRAMUSCULAR | Status: DC | PRN
  Administered 2014-05-25 (×4): 50 ug via INTRAVENOUS

## 2014-05-25 MED ORDER — LACTATED RINGERS IV SOLN
INTRAVENOUS | Status: DC
  Administered 2014-05-25 (×2): via INTRAVENOUS

## 2014-05-25 MED ORDER — BUPIVACAINE HCL (PF) 0.5 % IJ SOLN
INTRAMUSCULAR | Status: AC
  Filled 2014-05-25: qty 30

## 2014-05-25 MED ORDER — HYDROCODONE-ACETAMINOPHEN 7.5-325 MG PO TABS: 1.0000 | ORAL_TABLET | Freq: Four times a day (QID) | ORAL | Status: DC | PRN

## 2014-05-25 MED ORDER — MIDAZOLAM HCL 2 MG/2ML IJ SOLN: 1.0000 mg | INTRAMUSCULAR | Status: DC | PRN

## 2014-05-25 MED ORDER — DEXAMETHASONE SODIUM PHOSPHATE 4 MG/ML IJ SOLN
INTRAMUSCULAR | Status: DC | PRN
  Administered 2014-05-25: 10 mg via INTRAVENOUS

## 2014-05-25 MED ORDER — HYDROMORPHONE HCL 1 MG/ML IJ SOLN: 0.2500 mg | INTRAMUSCULAR | Status: DC | PRN

## 2014-05-25 MED ORDER — OXYCODONE HCL 5 MG/5ML PO SOLN: 5.0000 mg | Freq: Once | ORAL | Status: AC | PRN

## 2014-05-25 MED ORDER — LIDOCAINE HCL (CARDIAC) 20 MG/ML IV SOLN
INTRAVENOUS | Status: DC | PRN
  Administered 2014-05-25: 30 mg via INTRAVENOUS

## 2014-05-25 SURGICAL SUPPLY — 40 items
BANDAGE ELASTIC 6 VELCRO ST LF (GAUZE/BANDAGES/DRESSINGS) ×4 IMPLANT
BLADE 4.2CUDA (BLADE) IMPLANT
BLADE GREAT WHITE 4.2 (BLADE) ×3 IMPLANT
BLADE GREAT WHITE 4.2MM (BLADE) ×1
CUTTER MENISCUS  4.2MM (BLADE)
CUTTER MENISCUS 4.2MM (BLADE) IMPLANT
DRAPE ARTHROSCOPY W/POUCH 114 (DRAPES) ×4 IMPLANT
DRSG EMULSION OIL 3X3 NADH (GAUZE/BANDAGES/DRESSINGS) ×4 IMPLANT
DURAPREP 26ML APPLICATOR (WOUND CARE) ×4 IMPLANT
ELECT MENISCUS 165MM 90D (ELECTRODE) IMPLANT
ELECT REM PT RETURN 9FT ADLT (ELECTROSURGICAL)
ELECTRODE REM PT RTRN 9FT ADLT (ELECTROSURGICAL) IMPLANT
GAUZE SPONGE 4X4 12PLY STRL (GAUZE/BANDAGES/DRESSINGS) ×4 IMPLANT
GLOVE BIOGEL PI IND STRL 8 (GLOVE) ×4 IMPLANT
GLOVE BIOGEL PI INDICATOR 8 (GLOVE) ×4
GLOVE ECLIPSE 6.5 STRL STRAW (GLOVE) ×3 IMPLANT
GLOVE ECLIPSE 7.5 STRL STRAW (GLOVE) ×8 IMPLANT
GOWN STRL REUS W/ TWL LRG LVL3 (GOWN DISPOSABLE) ×2 IMPLANT
GOWN STRL REUS W/ TWL XL LVL3 (GOWN DISPOSABLE) ×2 IMPLANT
GOWN STRL REUS W/TWL LRG LVL3 (GOWN DISPOSABLE) ×4
GOWN STRL REUS W/TWL XL LVL3 (GOWN DISPOSABLE) ×8 IMPLANT
HOLDER KNEE FOAM BLUE (MISCELLANEOUS) ×4 IMPLANT
KNEE WRAP E Z 3 GEL PACK (MISCELLANEOUS) ×4 IMPLANT
MANIFOLD NEPTUNE II (INSTRUMENTS) ×3 IMPLANT
NDL SAFETY ECLIPSE 18X1.5 (NEEDLE) IMPLANT
NEEDLE HYPO 18GX1.5 SHARP (NEEDLE)
PACK ARTHROSCOPY DSU (CUSTOM PROCEDURE TRAY) ×4 IMPLANT
PACK BASIN DAY SURGERY FS (CUSTOM PROCEDURE TRAY) ×4 IMPLANT
PAD CAST 4YDX4 CTTN HI CHSV (CAST SUPPLIES) ×2 IMPLANT
PADDING CAST ABS 4INX4YD NS (CAST SUPPLIES) ×2
PADDING CAST ABS COTTON 4X4 ST (CAST SUPPLIES) ×1 IMPLANT
PADDING CAST COTTON 4X4 STRL (CAST SUPPLIES) ×4
PENCIL BUTTON HOLSTER BLD 10FT (ELECTRODE) IMPLANT
SET ARTHROSCOPY TUBING (MISCELLANEOUS) ×4
SET ARTHROSCOPY TUBING LN (MISCELLANEOUS) ×2 IMPLANT
SUT ETHILON 4 0 PS 2 18 (SUTURE) IMPLANT
SYR 5ML LL (SYRINGE) ×4 IMPLANT
TOWEL OR 17X24 6PK STRL BLUE (TOWEL DISPOSABLE) ×4 IMPLANT
TOWEL OR NON WOVEN STRL DISP B (DISPOSABLE) ×4 IMPLANT
WATER STERILE IRR 1000ML POUR (IV SOLUTION) ×4 IMPLANT

## 2014-05-25 NOTE — Anesthesia Postprocedure Evaluation (Signed)
  Anesthesia Post-op Note  Patient: Haley Shah  Procedure(s) Performed: Procedure(s): LEFT ARTHROSCOPY KNEE medial and lateral meniscectomy,femoral patella chondroplasty (Left)  Patient Location: PACU  Anesthesia Type:General  Level of Consciousness: awake  Airway and Oxygen Therapy: Patient Spontanous Breathing  Post-op Pain: mild  Post-op Assessment: Post-op Vital signs reviewed  Post-op Vital Signs: Reviewed  Last Vitals:  Filed Vitals:   05/25/14 1512  BP: 161/95  Pulse: 94  Temp: 36.9 C  Resp: 18    Complications: No apparent anesthesia complications

## 2014-05-25 NOTE — Anesthesia Procedure Notes (Signed)
Procedure Name: LMA Insertion Date/Time: 05/25/2014 1:46 PM Performed by: Toula Moos L Pre-anesthesia Checklist: Patient identified, Emergency Drugs available, Suction available, Patient being monitored and Timeout performed Patient Re-evaluated:Patient Re-evaluated prior to inductionOxygen Delivery Method: Circle System Utilized Preoxygenation: Pre-oxygenation with 100% oxygen Intubation Type: IV induction Ventilation: Mask ventilation without difficulty LMA: LMA inserted LMA Size: 4.0 Number of attempts: 1 Airway Equipment and Method: Bite block Placement Confirmation: positive ETCO2 Tube secured with: Tape Dental Injury: Teeth and Oropharynx as per pre-operative assessment

## 2014-05-25 NOTE — Discharge Instructions (Signed)

## 2014-05-25 NOTE — H&P (Signed)
PREOPERATIVE H&P  Chief Complaint: l knee pain  HPI: Haley Shah is a 74 y.o. female who presents for evaluation of l knee pain. It has been present for greater than 3 months and has been worsening. She has failed conservative measures. Pain is rated as moderate.  Past Medical History  Diagnosis Date  . GERD (gastroesophageal reflux disease)   . History of thyroid cancer   . IBS (irritable bowel syndrome)   . Sciatica     RLE  . Hyperlipidemia   . Cancer 2010    Left Breast  . Vitamin D deficiency   . Pre-diabetes   . Allergy   . Thyroid disease     Hypo  . Glaucoma   . Hypertension     no meds  . Dyspnea     chronic-no cardiac reason  . Wears glasses    Past Surgical History  Procedure Laterality Date  . Thyroid surgery  1986    partial thyroidectomy  . Breast lumpectomy  2010    LEFT BREAST-snbx  . Colonoscopy    . Cardiac catheterization  2006    normal coronary arteries   History   Social History  . Marital Status: Married    Spouse Name: N/A  . Number of Children: N/A  . Years of Education: N/A   Social History Main Topics  . Smoking status: Never Smoker   . Smokeless tobacco: Never Used  . Alcohol Use: No  . Drug Use: No  . Sexual Activity: Not Currently    Birth Control/ Protection: Post-menopausal   Other Topics Concern  . None   Social History Narrative   Family History  Problem Relation Age of Onset  . Colon cancer Father   . Stroke Father   . Cancer Father     Pancreatic   Allergies  Allergen Reactions  . Neomycin-Bacitracin Zn-Polymyx     REACTION: rash  . Propoxyphene Hcl     REACTION: syncope  . Darvon Other (See Comments)    Almost fainted when taking, over 50 years.   Prior to Admission medications   Medication Sig Start Date End Date Taking? Authorizing Provider  latanoprost (XALATAN) 0.005 % ophthalmic solution Place 1 drop into both eyes at bedtime.   Yes Historical Provider, MD  atorvastatin (LIPITOR) 80 MG tablet  Take 1 tablet daily or as directed for cholesterol-  Pt takes Tues Thurs & NID 06/22/13 06/22/14  Unk Pinto, MD  levothyroxine (SYNTHROID, LEVOTHROID) 100 MCG tablet TAKE ONE TABLET BY MOUTH ONCE DAILY 03/23/14   Unk Pinto, MD  loratadine (CLARITIN) 10 MG tablet Take 10 mg by mouth daily.    Historical Provider, MD  Meloxicam (MOBIC PO) Take by mouth as needed.    Historical Provider, MD  Multiple Vitamins-Minerals (MULTIVITAMIN PO) Take by mouth daily.    Historical Provider, MD  OVER THE COUNTER MEDICATION OTC Fiber Gummies    Historical Provider, MD  VITAMIN D, CHOLECALCIFEROL, PO Take 2,000 Int'l Units by mouth.     Historical Provider, MD  Wheat Dextrin (BENEFIBER) POWD Take 2 scoop by mouth daily.    Historical Provider, MD     Positive ROS: none  All other systems have been reviewed and were otherwise negative with the exception of those mentioned in the HPI and as above.  Physical Exam: Filed Vitals:   05/25/14 1221  BP: 144/63  Pulse: 86  Temp: 98.4 F (36.9 C)  Resp: 16    General: Alert, no acute distress  Cardiovascular: No pedal edema Respiratory: No cyanosis, no use of accessory musculature GI: No organomegaly, abdomen is soft and non-tender Skin: No lesions in the area of chief complaint Neurologic: Sensation intact distally Psychiatric: Patient is competent for consent with normal mood and affect Lymphatic: No axillary or cervical lymphadenopathy  MUSCULOSKELETAL: l knee: +ttp over med jt line//no instability//trace effusion  Assessment/Plan: medial meniscus tear left knee Plan for Procedure(s): ARTHROSCOPY KNEE  The risks benefits and alternatives were discussed with the patient including but not limited to the risks of nonoperative treatment, versus surgical intervention including infection, bleeding, nerve injury, malunion, nonunion, hardware prominence, hardware failure, need for hardware removal, blood clots, cardiopulmonary complications, morbidity,  mortality, among others, and they were willing to proceed.  Predicted outcome is good, although there will be at least a six to nine month expected recovery.  Alta Corning, MD 05/25/2014 1:26 PM

## 2014-05-25 NOTE — Transfer of Care (Signed)
Immediate Anesthesia Transfer of Care Note  Patient: Haley Shah  Procedure(s) Performed: Procedure(s): LEFT ARTHROSCOPY KNEE medial and lateral meniscectomy,femoral patella chondroplasty (Left)  Patient Location: PACU  Anesthesia Type:General  Level of Consciousness: sedated and patient cooperative  Airway & Oxygen Therapy: Patient Spontanous Breathing and Patient connected to face mask oxygen  Post-op Assessment: Report given to RN and Post -op Vital signs reviewed and stable  Post vital signs: Reviewed and stable  Last Vitals:  Filed Vitals:   05/25/14 1221  BP: 144/63  Pulse: 86  Temp: 36.9 C  Resp: 16    Complications: No apparent anesthesia complications

## 2014-05-25 NOTE — Anesthesia Preprocedure Evaluation (Signed)
Anesthesia Evaluation  Patient identified by MRN, date of birth, ID band Patient awake    Reviewed: Allergy & Precautions, NPO status , Patient's Chart, lab work & pertinent test results  Airway Mallampati: I  TM Distance: >3 FB Neck ROM: Full    Dental  (+) Teeth Intact, Dental Advisory Given   Pulmonary    breath sounds clear to auscultation       Cardiovascular hypertension, Pt. on medications  Rhythm:Regular Rate:Normal     Neuro/Psych    GI/Hepatic GERD  Medicated and Controlled,  Endo/Other    Renal/GU      Musculoskeletal   Abdominal   Peds  Hematology   Anesthesia Other Findings   Reproductive/Obstetrics                             Anesthesia Physical Anesthesia Plan  ASA: II  Anesthesia Plan: General   Post-op Pain Management:    Induction: Intravenous  Airway Management Planned: LMA  Additional Equipment:   Intra-op Plan:   Post-operative Plan: Extubation in OR  Informed Consent: I have reviewed the patients History and Physical, chart, labs and discussed the procedure including the risks, benefits and alternatives for the proposed anesthesia with the patient or authorized representative who has indicated his/her understanding and acceptance.   Dental advisory given  Plan Discussed with: CRNA, Anesthesiologist and Surgeon  Anesthesia Plan Comments:         Anesthesia Quick Evaluation  

## 2014-05-25 NOTE — Brief Op Note (Signed)
05/25/2014  2:25 PM  PATIENT:  Haley Shah  74 y.o. female  PRE-OPERATIVE DIAGNOSIS:  medial meniscus tear left knee  POST-OPERATIVE DIAGNOSIS:  medial meniscus tear left knee  PROCEDURE:  Procedure(s): LEFT ARTHROSCOPY KNEE medial and lateral meniscectomy,femoral patella chondroplasty (Left)  SURGEON:  Surgeon(s) and Role:    * Dorna Leitz, MD - Primary  PHYSICIAN ASSISTANT:   ASSISTANTS: bethune   ANESTHESIA:   general  EBL:  Total I/O In: 1000 [I.V.:1000] Out: -   BLOOD ADMINISTERED:none  DRAINS: none   LOCAL MEDICATIONS USED:  MARCAINE     SPECIMEN:  No Specimen  DISPOSITION OF SPECIMEN:  N/A  COUNTS:  YES  TOURNIQUET:  * No tourniquets in log *  DICTATION: .Other Dictation: Dictation Number missed number   PLAN OF CARE: Discharge to home after PACU  PATIENT DISPOSITION:  PACU - hemodynamically stable.   Delay start of Pharmacological VTE agent (>24hrs) due to surgical blood loss or risk of bleeding: no

## 2014-05-26 ENCOUNTER — Encounter (HOSPITAL_BASED_OUTPATIENT_CLINIC_OR_DEPARTMENT_OTHER): Payer: Self-pay | Admitting: Orthopedic Surgery

## 2014-05-26 LAB — POCT HEMOGLOBIN-HEMACUE: Hemoglobin: 14.1 g/dL (ref 12.0–15.0)

## 2014-05-26 NOTE — Op Note (Signed)
NAMESHIRONDA, Haley Shah                ACCOUNT NO.:  [id number]  MEDICAL RECORD NO.:  [id number]  LOCATION:                                 FACILITY:  PHYSICIAN:  Alta Corning, M.D.   DATE OF BIRTH:  10/19/1940  DATE OF PROCEDURE:  05/25/2014 DATE OF DISCHARGE:  05/25/2014                              OPERATIVE REPORT   PREOPERATIVE DIAGNOSIS:  Medial meniscal tear.  POSTOPERATIVE DIAGNOSES: 1. Medial meniscal tear. 2. Lateral meniscal tear. 3. Chondromalacia of the lateral femoral condyle. 4. Chondromalacia of the patellofemoral joint.  PROCEDURE: 1. Partial medial and partial lateral meniscectomy. 2. Chondroplasty of the lateral femoral condyle down to bleeding bone. 3. Chondroplasty of the patellofemoral joint down to bleeding bone.  SURGEON:  Alta Corning, M.D.  ASSISTANT:  Gary Fleet, PA-C.  ANESTHESIA:  General.  BRIEF HISTORY:  [name] is a 74 year old female with a history of significant complaints of left knee pain.  She had been treated conservatively for a period of time after failure of conservative care. We discussed the possibility of MRI as she was not able to get an MRI and after further discussion and failure of injection, therapy, and activity modification and medication, we felt that surgical intervention was necessary and appropriate, and she was brought to the operating room for this procedure.  DESCRIPTION OF PROCEDURE:  The patient was brought to the operating room.  After adequate anesthesia was obtained with general anesthetic, the patient was placed supine on the operating table.  The left leg was prepped and draped in usual sterile fashion.  Following this, routine arthroscopic examination of the knee revealed there was an obvious chondromalacia of the lateral femoral condyle and this was debrided back down to bleeding bone in the lateral compartment.  Medial meniscus was torn in the midbody around posteriorly and this was debrided  back to a smooth and stable rim.  Lateral meniscus was torn posteriorly and this was debrided back to a smooth and stable rim.  Attention turned up to the patellofemoral joint where there was a large expanse of grade 4 change on the medial femoral condyle  anteriorly.  This was debrided and there was significant amount of synovium in the knee which was debrided. Once this was completed, the knee was copiously and thoroughly lavaged with irrigation and suctioned dry.  Arthroscopic portals were closed with a bandage.  A sterile compressive dressing was applied.  After 20 mL of 0.25% Marcaine was instilled around the wounds for postoperative anesthesia and the patient was taken to the recovery room, where she was noted to be in satisfactory condition.  Estimated blood loss for this procedure was minimal.     Alta Corning, M.D.     Corliss Skains  D:  05/25/2014  T:  05/26/2014  Job:  [id number]

## 2014-06-06 ENCOUNTER — Encounter (HOSPITAL_BASED_OUTPATIENT_CLINIC_OR_DEPARTMENT_OTHER): Payer: Self-pay | Admitting: Orthopedic Surgery

## 2014-06-07 ENCOUNTER — Encounter: Payer: Self-pay | Admitting: Internal Medicine

## 2014-06-08 ENCOUNTER — Ambulatory Visit (INDEPENDENT_AMBULATORY_CARE_PROVIDER_SITE_OTHER): Payer: Medicare HMO | Admitting: Internal Medicine

## 2014-06-08 ENCOUNTER — Encounter: Payer: Self-pay | Admitting: Internal Medicine

## 2014-06-08 VITALS — BP 122/76 | HR 64 | Temp 97.3°F | Resp 16 | Ht 61.0 in | Wt 131.2 lb

## 2014-06-08 DIAGNOSIS — K2100 Gastro-esophageal reflux disease with esophagitis, without bleeding: Secondary | ICD-10-CM

## 2014-06-08 DIAGNOSIS — Z1211 Encounter for screening for malignant neoplasm of colon: Secondary | ICD-10-CM

## 2014-06-08 DIAGNOSIS — I10 Essential (primary) hypertension: Secondary | ICD-10-CM

## 2014-06-08 DIAGNOSIS — C50912 Malignant neoplasm of unspecified site of left female breast: Secondary | ICD-10-CM

## 2014-06-08 DIAGNOSIS — E559 Vitamin D deficiency, unspecified: Secondary | ICD-10-CM

## 2014-06-08 DIAGNOSIS — Z79899 Other long term (current) drug therapy: Secondary | ICD-10-CM

## 2014-06-08 DIAGNOSIS — K589 Irritable bowel syndrome without diarrhea: Secondary | ICD-10-CM

## 2014-06-08 DIAGNOSIS — R7303 Prediabetes: Secondary | ICD-10-CM

## 2014-06-08 DIAGNOSIS — M899 Disorder of bone, unspecified: Secondary | ICD-10-CM

## 2014-06-08 DIAGNOSIS — R7309 Other abnormal glucose: Secondary | ICD-10-CM

## 2014-06-08 DIAGNOSIS — M949 Disorder of cartilage, unspecified: Secondary | ICD-10-CM

## 2014-06-08 DIAGNOSIS — E039 Hypothyroidism, unspecified: Secondary | ICD-10-CM

## 2014-06-08 DIAGNOSIS — Z1331 Encounter for screening for depression: Secondary | ICD-10-CM

## 2014-06-08 DIAGNOSIS — E782 Mixed hyperlipidemia: Secondary | ICD-10-CM

## 2014-06-08 DIAGNOSIS — Z9181 History of falling: Secondary | ICD-10-CM

## 2014-06-08 LAB — CBC WITH DIFFERENTIAL/PLATELET
BASOS ABS: 0.1 10*3/uL (ref 0.0–0.1)
Basophils Relative: 1 % (ref 0–1)
Eosinophils Absolute: 0.1 10*3/uL (ref 0.0–0.7)
Eosinophils Relative: 1 % (ref 0–5)
HEMATOCRIT: 39.5 % (ref 36.0–46.0)
HEMOGLOBIN: 12.9 g/dL (ref 12.0–15.0)
LYMPHS ABS: 1.8 10*3/uL (ref 0.7–4.0)
Lymphocytes Relative: 25 % (ref 12–46)
MCH: 29.9 pg (ref 26.0–34.0)
MCHC: 32.7 g/dL (ref 30.0–36.0)
MCV: 91.6 fL (ref 78.0–100.0)
MPV: 8.9 fL (ref 8.6–12.4)
Monocytes Absolute: 0.7 10*3/uL (ref 0.1–1.0)
Monocytes Relative: 9 % (ref 3–12)
NEUTROS ABS: 4.7 10*3/uL (ref 1.7–7.7)
Neutrophils Relative %: 64 % (ref 43–77)
PLATELETS: 260 10*3/uL (ref 150–400)
RBC: 4.31 MIL/uL (ref 3.87–5.11)
RDW: 13.8 % (ref 11.5–15.5)
WBC: 7.3 10*3/uL (ref 4.0–10.5)

## 2014-06-08 LAB — BASIC METABOLIC PANEL WITH GFR
BUN: 16 mg/dL (ref 6–23)
CO2: 26 mEq/L (ref 19–32)
Calcium: 9.4 mg/dL (ref 8.4–10.5)
Chloride: 104 mEq/L (ref 96–112)
Creat: 0.78 mg/dL (ref 0.50–1.10)
GFR, Est African American: 86 mL/min
GFR, Est Non African American: 75 mL/min
Glucose, Bld: 85 mg/dL (ref 70–99)
Potassium: 4 mEq/L (ref 3.5–5.3)
SODIUM: 140 meq/L (ref 135–145)

## 2014-06-08 LAB — HEPATIC FUNCTION PANEL
ALBUMIN: 4.3 g/dL (ref 3.5–5.2)
ALT: 11 U/L (ref 0–35)
AST: 15 U/L (ref 0–37)
Alkaline Phosphatase: 75 U/L (ref 39–117)
Bilirubin, Direct: 0.1 mg/dL (ref 0.0–0.3)
Indirect Bilirubin: 0.3 mg/dL (ref 0.2–1.2)
TOTAL PROTEIN: 6.4 g/dL (ref 6.0–8.3)
Total Bilirubin: 0.4 mg/dL (ref 0.2–1.2)

## 2014-06-08 LAB — TSH: TSH: 0.324 u[IU]/mL — ABNORMAL LOW (ref 0.350–4.500)

## 2014-06-08 LAB — LIPID PANEL
CHOL/HDL RATIO: 4 ratio
Cholesterol: 178 mg/dL (ref 0–200)
HDL: 44 mg/dL — AB (ref >=46)
LDL CALC: 113 mg/dL — AB (ref 0–99)
Triglycerides: 105 mg/dL (ref <150)
VLDL: 21 mg/dL (ref 0–40)

## 2014-06-08 LAB — MAGNESIUM: Magnesium: 2 mg/dL (ref 1.5–2.5)

## 2014-06-08 NOTE — Patient Instructions (Signed)
Recommend the book "The END of DIETING" by Dr Excell Seltzer   & the book "The END of DIABETES " by Dr Excell Seltzer  At [email or web address] - get book & Audio CD's      Being diabetic has a  300% increased risk for heart attack, stroke, cancer, and alzheimer- type vascular dementia. It is very important that you work harder with diet by avoiding all foods that are white. Avoid white rice (brown & wild rice is OK), white potatoes (sweetpotatoes in moderation is OK), White bread or wheat bread or anything made out of white flour like bagels, donuts, rolls, buns, biscuits, cakes, pastries, cookies, pizza crust, and pasta (made from white flour & egg whites) - vegetarian pasta or spinach or wheat pasta is OK. Multigrain breads like Arnold's or [employer], or multigrain sandwich thins or flatbreads.  Diet, exercise and weight loss can reverse and cure diabetes in the early stages.  Diet, exercise and weight loss is very important in the control and prevention of complications of diabetes which affects every system in your body, ie. Brain - dementia/stroke, eyes - glaucoma/blindness, heart - heart attack/heart failure, kidneys - dialysis, stomach - gastric paralysis, intestines - malabsorption, nerves - severe painful neuritis, circulation - gangrene & loss of a leg(s), and finally cancer and Alzheimers.    I recommend avoid fried & greasy foods,  sweets/candy, white rice (brown or wild rice or Quinoa is OK), white potatoes (sweet potatoes are OK) - anything made from white flour - bagels, doughnuts, rolls, buns, biscuits,white and wheat breads, pizza crust and traditional pasta made of white flour & egg white(vegetarian pasta or spinach or wheat pasta is OK).  Multi-grain bread is OK - like multi-grain flat bread or sandwich thins. Avoid alcohol in excess. Exercise is also important.    Eat all the vegetables you want - avoid meat, especially red meat and dairy - especially cheese.  Cheese is the most  concentrated form of trans-fats which is the worst thing to clog up our arteries. Veggie cheese is OK which can be found in the fresh produce section at [employer] or [employer] or [employer]  Preventive Care for Adults A healthy lifestyle and preventive care can promote health and wellness. Preventive health guidelines for women include the following key practices.  A routine yearly physical is a good way to check with your health care provider about your health and preventive screening. It is a chance to share any concerns and updates on your health and to receive a thorough exam.  Visit your dentist for a routine exam and preventive care every 6 months. Brush your teeth twice a day and floss once a day. Good oral hygiene prevents tooth decay and gum disease.  The frequency of eye exams is based on your age, health, family medical history, use of contact lenses, and other factors. Follow your health care provider's recommendations for frequency of eye exams.  Eat a healthy diet. Foods like vegetables, fruits, whole grains, low-fat dairy products, and lean protein foods contain the nutrients you need without too many calories. Decrease your intake of foods high in solid fats, added sugars, and salt. Eat the right amount of calories for you.Get information about a proper diet from your health care provider, if necessary.  Regular physical exercise is one of the most important things you can do for your health. Most adults should get at least 150 minutes of moderate-intensity exercise (any activity that increases your heart rate and  causes you to sweat) each week. In addition, most adults need muscle-strengthening exercises on 2 or more days a week.  Maintain a healthy weight. The body mass index (BMI) is a screening tool to identify possible weight problems. It provides an estimate of body fat based on height and weight. Your health care provider can find your BMI and can help you achieve or  maintain a healthy weight.For adults 20 years and older:  A BMI below 18.5 is considered underweight.  A BMI of 18.5 to 24.9 is normal.  A BMI of 25 to 29.9 is considered overweight.  A BMI of 30 and above is considered obese.  Maintain normal blood lipids and cholesterol levels by exercising and minimizing your intake of saturated fat. Eat a balanced diet with plenty of fruit and vegetables. If your lipid or cholesterol levels are high, you are over 50, or you are at high risk for heart disease, you may need your cholesterol levels checked more frequently.Ongoing high lipid and cholesterol levels should be treated with medicines if diet and exercise are not working.  If you smoke, find out from your health care provider how to quit. If you do not use tobacco, do not start.  Lung cancer screening is recommended for adults aged 55-80 years who are at high risk for developing lung cancer because of a history of smoking. A yearly low-dose CT scan of the lungs is recommended for people who have at least a 30-pack-year history of smoking and are a current smoker or have quit within the past 15 years. A pack year of smoking is smoking an average of 1 pack of cigarettes a day for 1 year (for example: 1 pack a day for 30 years or 2 packs a day for 15 years). Yearly screening should continue until the smoker has stopped smoking for at least 15 years. Yearly screening should be stopped for people who develop a health problem that would prevent them from having lung cancer treatment.  Avoid use of street drugs. Do not share needles with anyone. Ask for help if you need support or instructions about stopping the use of drugs.  High blood pressure causes heart disease and increases the risk of stroke.  Ongoing high blood pressure should be treated with medicines if weight loss and exercise do not work.  If you are 55-79 years old, ask your health care provider if you should take aspirin to prevent  strokes.  Diabetes screening involves taking a blood sample to check your fasting blood sugar level. This should be done once every 3 years, after age 45, if you are within normal weight and without risk factors for diabetes. Testing should be considered at a younger age or be carried out more frequently if you are overweight and have at least 1 risk factor for diabetes.  Breast cancer screening is essential preventive care for women. You should practice "breast self-awareness." This means understanding the normal appearance and feel of your breasts and may include breast self-examination. Any changes detected, no matter how small, should be reported to a health care provider. Women in their 20s and 30s should have a clinical breast exam (CBE) by a health care provider as part of a regular health exam every 1 to 3 years. After age 40, women should have a CBE every year. Starting at age 40, women should consider having a mammogram (breast X-ray test) every year. Women who have a family history of breast cancer should talk to their health   care provider about genetic screening. Women at a high risk of breast cancer should talk to their health care providers about having an MRI and a mammogram every year.  Breast cancer gene (BRCA)-related cancer risk assessment is recommended for women who have family members with BRCA-related cancers. BRCA-related cancers include breast, ovarian, tubal, and peritoneal cancers. Having family members with these cancers may be associated with an increased risk for harmful changes (mutations) in the breast cancer genes BRCA1 and BRCA2. Results of the assessment will determine the need for genetic counseling and BRCA1 and BRCA2 testing.  Routine pelvic exams to screen for cancer are no longer recommended for nonpregnant women who are considered low risk for cancer of the pelvic organs (ovaries, uterus, and vagina) and who do not have symptoms. Ask your health care provider if a  screening pelvic exam is right for you.  If you have had past treatment for cervical cancer or a condition that could lead to cancer, you need Pap tests and screening for cancer for at least 20 years after your treatment. If Pap tests have been discontinued, your risk factors (such as having a new sexual partner) need to be reassessed to determine if screening should be resumed. Some women have medical problems that increase the chance of getting cervical cancer. In these cases, your health care provider may recommend more frequent screening and Pap tests.    Colorectal cancer can be detected and often prevented. Most routine colorectal cancer screening begins at the age of 100 years and continues through age 42 years. However, your health care provider may recommend screening at an earlier age if you have risk factors for colon cancer. On a yearly basis, your health care provider may provide home test kits to check for hidden blood in the stool. Use of a small camera at the end of a tube, to directly examine the colon (sigmoidoscopy or colonoscopy), can detect the earliest forms of colorectal cancer. Talk to your health care provider about this at age 8, when routine screening begins. Direct exam of the colon should be repeated every 5-10 years through age 30 years, unless early forms of pre-cancerous polyps or small growths are found.  Osteoporosis is a disease in which the bones lose minerals and strength with aging. This can result in serious bone fractures or breaks. The risk of osteoporosis can be identified using a bone density scan. Women ages 53 years and over and women at risk for fractures or osteoporosis should discuss screening with their health care providers. Ask your health care provider whether you should take a calcium supplement or vitamin D to reduce the rate of osteoporosis.  Menopause can be associated with physical symptoms and risks. Hormone replacement therapy is available to  decrease symptoms and risks. You should talk to your health care provider about whether hormone replacement therapy is right for you.  Use sunscreen. Apply sunscreen liberally and repeatedly throughout the day. You should seek shade when your shadow is shorter than you. Protect yourself by wearing long sleeves, pants, a wide-brimmed hat, and sunglasses year round, whenever you are outdoors.  Once a month, do a whole body skin exam, using a mirror to look at the skin on your back. Tell your health care provider of new moles, moles that have irregular borders, moles that are larger than a pencil eraser, or moles that have changed in shape or color.  Stay current with required vaccines (immunizations).  Influenza vaccine. All adults should be immunized every  year.  Tetanus, diphtheria, and acellular pertussis (Td, Tdap) vaccine. Pregnant women should receive 1 dose of Tdap vaccine during each pregnancy. The dose should be obtained regardless of the length of time since the last dose. Immunization is preferred during the 27th-36th week of gestation. An adult who has not previously received Tdap or who does not know her vaccine status should receive 1 dose of Tdap. This initial dose should be followed by tetanus and diphtheria toxoids (Td) booster doses every 10 years. Adults with an unknown or incomplete history of completing a 3-dose immunization series with Td-containing vaccines should begin or complete a primary immunization series including a Tdap dose. Adults should receive a Td booster every 10 years.    Zoster vaccine. One dose is recommended for adults aged 56 years or older unless certain conditions are present.    Pneumococcal 13-valent conjugate (PCV13) vaccine. When indicated, a person who is uncertain of her immunization history and has no record of immunization should receive the PCV13 vaccine. An adult aged 32 years or older who has certain medical conditions and has not been previously  immunized should receive 1 dose of PCV13 vaccine. This PCV13 should be followed with a dose of pneumococcal polysaccharide (PPSV23) vaccine. The PPSV23 vaccine dose should be obtained at least 8 weeks after the dose of PCV13 vaccine. An adult aged 42 years or older who has certain medical conditions and previously received 1 or more doses of PPSV23 vaccine should receive 1 dose of PCV13. The PCV13 vaccine dose should be obtained 1 or more years after the last PPSV23 vaccine dose.    Pneumococcal polysaccharide (PPSV23) vaccine. When PCV13 is also indicated, PCV13 should be obtained first. All adults aged 48 years and older should be immunized. An adult younger than age 59 years who has certain medical conditions should be immunized. Any person who resides in a nursing home or long-term care facility should be immunized. An adult smoker should be immunized. People with an immunocompromised condition and certain other conditions should receive both PCV13 and PPSV23 vaccines. People with human immunodeficiency virus (HIV) infection should be immunized as soon as possible after diagnosis. Immunization during chemotherapy or radiation therapy should be avoided. Routine use of PPSV23 vaccine is not recommended for American Indians, [place] Natives, or people younger than 65 years unless there are medical conditions that require PPSV23 vaccine. When indicated, people who have unknown immunization and have no record of immunization should receive PPSV23 vaccine. One-time revaccination 5 years after the first dose of PPSV23 is recommended for people aged 19-64 years who have chronic kidney failure, nephrotic syndrome, asplenia, or immunocompromised conditions. People who received 1-2 doses of PPSV23 before age 50 years should receive another dose of PPSV23 vaccine at age 76 years or later if at least 5 years have passed since the previous dose. Doses of PPSV23 are not needed for people immunized with PPSV23 at or after  age 46 years.   Preventive Services / Frequency  Ages 82 years and over  Blood pressure check.  Lipid and cholesterol check.  Lung cancer screening. / Every year if you are aged 21-80 years and have a 30-pack-year history of smoking and currently smoke or have quit within the past 15 years. Yearly screening is stopped once you have quit smoking for at least 15 years or develop a health problem that would prevent you from having lung cancer treatment.  Clinical breast exam.** / Every year after age 41 years.  BRCA-related cancer risk assessment.** /  For women who have family members with a BRCA-related cancer (breast, ovarian, tubal, or peritoneal cancers).  Mammogram.** / Every year beginning at age 40 years and continuing for as long as you are in good health. Consult with your health care provider.  Pap test.** / Every 3 years starting at age 30 years through age 65 or 70 years with 3 consecutive normal Pap tests. Testing can be stopped between 65 and 70 years with 3 consecutive normal Pap tests and no abnormal Pap or HPV tests in the past 10 years.  Fecal occult blood test (FOBT) of stool. / Every year beginning at age 50 years and continuing until age 75 years. You may not need to do this test if you get a colonoscopy every 10 years.  Flexible sigmoidoscopy or colonoscopy.** / Every 5 years for a flexible sigmoidoscopy or every 10 years for a colonoscopy beginning at age 50 years and continuing until age 75 years.  Hepatitis C blood test.** / For all people born from 1945 through 1965 and any individual with known risks for hepatitis C.  Osteoporosis screening.** / A one-time screening for women ages 65 years and over and women at risk for fractures or osteoporosis.  Skin self-exam. / Monthly.  Influenza vaccine. / Every year.  Tetanus, diphtheria, and acellular pertussis (Tdap/Td) vaccine.** / 1 dose of Td every 10 years.  Zoster vaccine.** / 1 dose for adults aged 60 years  or older.  Pneumococcal 13-valent conjugate (PCV13) vaccine.** / Consult your health care provider.  Pneumococcal polysaccharide (PPSV23) vaccine.** / 1 dose for all adults aged 65 years and older. Screening for abdominal aortic aneurysm (AAA)  by ultrasound is recommended for people who have history of high blood pressure or who are current or former smokers. 

## 2014-06-08 NOTE — Progress Notes (Signed)
Patient ID: Haley Shah, female   DOB: 03/15/1941, 74 y.o.   MRN: [id number]  Annual Comprehensive Examination  This very nice 74 y.o. MWF presents for complete physical.  Patient has been followed for HTN, Prediabetes, Hyperlipidemia, and Vitamin D Deficiency. In 2010, patient had a left partial mastectomy  for cancer  & was treated w/PO radiation and Arimidex and is followed closely by Dr Leane Para and has been released from Oncology.    HTN predates since 2013. Patient had a normal Ht Cath in 2006. Patient's BP has been controlled at home and patient denies any cardiac symptoms as chest pain, palpitations, shortness of breath, dizziness or ankle swelling. Today's BP: 122/76 mmHg    Patient's hyperlipidemia is controlled with diet and medications. Patient denies myalgias or other medication SE's. Last lipids were at goal - Total Chol  171; HDL 50; LDL 97; Trig 121 on 02/24/2014.   Patient has prediabetes predating  and patient denies reactive hypoglycemic symptoms, visual blurring, diabetic polys, or paresthesias. Last A1c was at goal in the normal range 5.6 % on 02/24/2014.   Patient has been euthyroid on replacement since 1995.  Finally, patient has history of Vitamin D Deficiency and last Vitamin D was  67 on 02/24/2014.  Medication Sig  . atorvastatin 80 MG tablet Take 1 tablet daily or as directed for cholesterol-  Pt takes Tues Thurs & Sat  . latanoprost (XALATAN) 0.005 % ophth soln Place 1 drop into both eyes at bedtime.  Marland Kitchen levothyroxine 100 MCG tablet TAKE ONE TABLET BY MOUTH ONCE DAILY  . loratadine  10 MG tablet Take 10 mg by mouth daily.  . Meloxicam  Take by mouth as needed.  . Multiple Vitamins-Minerals  Take by mouth daily.  . Fiber Gummies    . VITAMIN D,  Take 2,000 Int'l Units by mouth.   . Wheat Dextrin  Take 2 scoop by mouth daily.   Allergies  Allergen Reactions  . Neomycin-Bacitracin Zn-Polymyx     REACTION: rash  . Propoxyphene Hcl     REACTION: syncope  .  Darvon Other (See Comments)    Almost fainted when taking, over 50 years.   Past Medical History  Diagnosis Date  . GERD (gastroesophageal reflux disease)   . History of thyroid cancer   . IBS (irritable bowel syndrome)   . Sciatica     RLE  . Hyperlipidemia   . Cancer 2010    Left Breast  . Vitamin D deficiency   . Pre-diabetes   . Allergy   . Thyroid disease     Hypo  . Glaucoma   . Hypertension     no meds  . Dyspnea     chronic-no cardiac reason  . Wears glasses    Health Maintenance  Topic Date Due  . ZOSTAVAX  03/03/1999  . PNA vac Low Risk Adult (2 of 2 - PCV13) 03/18/2009  . INFLUENZA VACCINE  10/16/2013  . TETANUS/TDAP  03/19/2015  . COLONOSCOPY  03/19/2019  . DEXA SCAN  Completed   Immunization History  Administered Date(s) Administered  . Influenza, High Dose Seasonal PF 01/05/2013  . Pneumococcal Polysaccharide-23 03/18/2008  . Td 03/18/2005   Past Surgical History  Procedure Laterality Date  . Thyroid surgery  1986    partial thyroidectomy  . Breast lumpectomy  2010    LEFT BREAST-snbx  . Colonoscopy    . Cardiac catheterization  2006    normal coronary arteries  . Knee arthroscopy with  medial menisectomy Left 05/25/2014    Procedure: LEFT ARTHROSCOPY KNEE medial and lateral meniscectomy,femoral patella chondroplasty;  Surgeon: Dorna Leitz, MD;  Location: [facility];  Service: Orthopedics;  Laterality: Left;  . Knee arthroscopy with lateral menisectomy Left 05/25/2014    Procedure: KNEE ARTHROSCOPY WITH LATERAL MENISECTOMY;  Surgeon: Dorna Leitz, MD;  Location: [place] [facility];  Service: Orthopedics;  Laterality: Left;  . Chondroplasty Left 05/25/2014    Procedure: CHONDROPLASTY;  Surgeon: Dorna Leitz, MD;  Location: [facility];  Service: Orthopedics;  Laterality: Left;   Family History  Problem Relation Age of Onset  . Colon cancer Father   . Stroke Father   . Cancer Father     Pancreatic   History   Substance Use Topics  . Smoking status: Never Smoker   . Smokeless tobacco: Never Used  . Alcohol Use: No    ROS Constitutional: Denies fever, chills, weight loss/gain, headaches, insomnia, fatigue, night sweats, and change in appetite. Eyes: Denies redness, blurred vision, diplopia, discharge, itchy, watery eyes.  ENT: Denies discharge, congestion, post nasal drip, epistaxis, sore throat, earache, hearing loss, dental pain, Tinnitus, Vertigo, Sinus pain, snoring.  Cardio: Denies chest pain, palpitations, irregular heartbeat, syncope, dyspnea, diaphoresis, orthopnea, PND, claudication, edema Respiratory: denies cough, dyspnea, DOE, pleurisy, hoarseness, laryngitis, wheezing.  Gastrointestinal: Denies dysphagia, heartburn, reflux, water brash, pain, cramps, nausea, vomiting, bloating, diarrhea, constipation, hematemesis, melena, hematochezia, jaundice, hemorrhoids Genitourinary: Denies dysuria, frequency, urgency, nocturia, hesitancy, discharge, hematuria, flank pain Breast: Breast lumps, nipple discharge, bleeding.  Musculoskeletal: Denies arthralgia, myalgia, stiffness, Jt. Swelling, pain, limp, and strain/sprain. Denies falls. Skin: Denies puritis, rash, hives, warts, acne, eczema, changing in skin lesion Neuro: No weakness, tremor, incoordination, spasms, paresthesia, pain Psychiatric: Denies confusion, memory loss, sensory loss. Denies Depression. Endocrine: Denies change in weight, skin, hair change, nocturia, and paresthesia, diabetic polys, visual blurring, hyper / hypo glycemic episodes.  Heme/Lymph: No excessive bleeding, bruising, enlarged lymph nodes.  Physical Exam  BP 122/76   Pulse 64  Temp 97.3 F   Resp 16  Ht 5\' 1"    Wt 131 lb 3.2 oz    BMI 24.80   General Appearance: Well nourished and in no apparent distress. Eyes: PERRLA, EOMs, conjunctiva no swelling or erythema, normal fundi and vessels. Sinuses: No frontal/maxillary tenderness ENT/Mouth: EACs patent / TMs   nl. Nares clear without erythema, swelling, mucoid exudates. Oral hygiene is good. No erythema, swelling, or exudate. Tongue normal, non-obstructing. Tonsils not swollen or erythematous. Hearing normal.  Neck: Supple, thyroid normal. No bruits, nodes or JVD. Respiratory: Respiratory effort normal.  BS equal and clear bilateral without rales, rhonci, wheezing or stridor. Cardio: Heart sounds are normal with regular rate and rhythm and no murmurs, rubs or gallops. Peripheral pulses are normal and equal bilaterally without edema. No aortic or femoral bruits. Chest: symmetric with normal excursions and percussion. Breasts: Symmetric, without lumps, nipple discharge, retractions, or fibrocystic changes.  Abdomen: Flat, soft, with bowl sounds. Nontender, no guarding, rebound, hernias, masses, or organomegaly.  Lymphatics: Non tender without lymphadenopathy.  Musculoskeletal: Full ROM all peripheral extremities, joint stability, 5/5 strength, and normal gait. Skin: Warm and dry without rashes, lesions, cyanosis, clubbing or  ecchymosis.  Neuro: Cranial nerves intact, reflexes equal bilaterally. Normal muscle tone, no cerebellar symptoms. Sensation intact.  Pysch: Awake and oriented X 3, normal affect, Insight and Judgment appropriate.   Assessment and Plan  1. Essential hypertension  - Microalbumin / creatinine urine ratio - EKG 12-Lead - [place],  RETROPERITNL ABD,  LTD  2. Hyperlipidemia  - Lipid panel  3. PreDiabetes  - Hemoglobin A1c - Insulin, random  4. Vitamin D deficiency  - Vit D  25 hydroxy   5. Hypothyroidism  - TSH  6. Gastroesophageal reflux disease    7. IBS   8. Disorder of bone and cartilage   9. Cancer of left breast   10. Special screening for malignant neoplasms, colon  - POC Hemoccult Bld/Stl (3-Cd Home Screen); Future  11. Depression screen  - Negative Screen  12. At low risk for fall   13. Medication management  - Urine Microscopic - CBC with  Differential/Platelet - BASIC METABOLIC PANEL WITH GFR - Hepatic function panel - Magnesium    Continue prudent diet as discussed, weight control, BP monitoring, regular exercise, and medications. Discussed med's effects and SE's. Screening labs and tests as requested with regular follow-up as recommended. Over 40 minutes of exam, counseling, chart review was performed.

## 2014-06-09 LAB — MICROALBUMIN / CREATININE URINE RATIO
Creatinine, Urine: 28.4 mg/dL
MICROALB/CREAT RATIO: 7 mg/g (ref 0.0–30.0)
Microalb, Ur: 0.2 mg/dL (ref <2.0)

## 2014-06-09 LAB — INSULIN, RANDOM: Insulin: 5.3 u[IU]/mL (ref 2.0–19.6)

## 2014-06-09 LAB — URINALYSIS, MICROSCOPIC ONLY
Bacteria, UA: NONE SEEN
CRYSTALS: NONE SEEN
Casts: NONE SEEN
Squamous Epithelial / LPF: NONE SEEN

## 2014-06-09 LAB — VITAMIN D 25 HYDROXY (VIT D DEFICIENCY, FRACTURES): Vit D, 25-Hydroxy: 52 ng/mL (ref 30–100)

## 2014-06-09 LAB — HEMOGLOBIN A1C
Hgb A1c MFr Bld: 5.4 %
Mean Plasma Glucose: 108 mg/dL

## 2014-06-13 ENCOUNTER — Encounter: Payer: Self-pay | Admitting: Internal Medicine

## 2014-06-23 ENCOUNTER — Other Ambulatory Visit: Payer: PPO

## 2014-06-23 DIAGNOSIS — Z1211 Encounter for screening for malignant neoplasm of colon: Secondary | ICD-10-CM

## 2014-06-23 LAB — POC HEMOCCULT BLD/STL (HOME/3-CARD/SCREEN)
Card #2 Fecal Occult Blod, POC: NEGATIVE
FECAL OCCULT BLD: NEGATIVE
FECAL OCCULT BLD: NEGATIVE

## 2014-07-22 ENCOUNTER — Telehealth: Payer: Self-pay | Admitting: *Deleted

## 2014-07-22 NOTE — Telephone Encounter (Signed)
Let pt know we would cancel labs.  Pt voiced understanding.

## 2014-07-22 NOTE — Telephone Encounter (Signed)
TC from patient regarding upcoming appt with Dr. Lindi Adie. She is also scheduled for labs prior to seeing Dr. Lindi Adie. She wants to know if the labs she had done on 06/08/14 are good enough for Dr. Lindi Adie.  She would prefer not to have to repeat them. Please advise. Results from 06/08/14 are in [facility]

## 2014-07-24 NOTE — Assessment & Plan Note (Signed)
3 mm invasive ductal carcinoma with DCIS and calcifications/necrosis, stage 1, T1a, N0, ER 100%, PR 0%, Ki-67 10%, HER2/neu negative by CISH with a ratio 0.87. Status post left press lumpectomy with sentinel node excision and re-excision of medial margin on 11/23/2008, which showed no residual invasive carcinoma identified, pTX, pN0,  0/3 positive lymph nodes. Status post radiation therapy from 01/05/2009 until 02/17/2009. The patient started antiestrogen therapy with Tamoxifen from 02/2009 through 06/2011. The patient then switched antiestrogen therapy to Arimidex in 06/2011 until the present time.  Anti-estrogen Toxicities: Vaginal dryness  Breast Cancer Surveillance: 1. Breast exam 07/24/14: Normal 2. Mammogram  12/21/13 No abnormalities. Postsurgical changes. Breast Density Category B. I recommended that she get 3-D mammograms for surveillance. Discussed the differences between different breast density categories.

## 2014-07-25 ENCOUNTER — Other Ambulatory Visit: Payer: Medicare HMO

## 2014-07-25 ENCOUNTER — Ambulatory Visit (HOSPITAL_BASED_OUTPATIENT_CLINIC_OR_DEPARTMENT_OTHER): Payer: Medicare HMO | Admitting: Hematology and Oncology

## 2014-07-25 ENCOUNTER — Telehealth: Payer: Self-pay | Admitting: Hematology and Oncology

## 2014-07-25 VITALS — BP 135/59 | HR 76 | Temp 98.2°F | Resp 18 | Ht 61.0 in | Wt 133.0 lb

## 2014-07-25 DIAGNOSIS — Z853 Personal history of malignant neoplasm of breast: Secondary | ICD-10-CM | POA: Diagnosis not present

## 2014-07-25 DIAGNOSIS — C50912 Malignant neoplasm of unspecified site of left female breast: Secondary | ICD-10-CM

## 2014-07-25 NOTE — Addendum Note (Signed)
Addended by: Wyline Beady A on: 07/25/2014 12:11 PM   Modules accepted: Medications

## 2014-07-25 NOTE — Progress Notes (Signed)
Patient Care Team: Unk Pinto, MD as PCP - General (Internal Medicine)  DIAGNOSIS: No matching staging information was found for the patient.  SUMMARY OF ONCOLOGIC HISTORY:   Cancer of left breast   11/07/2008 Initial Diagnosis 3 mm invasive ductal carcinoma with DCIS and calcifications/necrosis, stage 1, T1a, N0, ER 100%, PR 0%, Ki-67 10%, HER2/neu negative by CISH with a ratio 0.87.    11/23/2008 Surgery left  lumpectomy : no residual invasive carcinoma identified, pTX, pN0, ER 100%, PR 0%, Ki-67 10%, HER2/neu by CISH no amplification, 0/3 positive lymph nodes   01/05/2009 - 02/17/2009 Radiation Therapy Adjuvant XRT   02/22/2009 - 02/23/2014 Anti-estrogen oral therapy Tamoxifen from 02/2009 to 06/2011, switched to Arimidex from 06/2011    CHIEF COMPLIANT:  Follow-up of breast cancer  INTERVAL HISTORY: LATEEFA Shah is a  74 year old lady with above-mentioned history of a small right breast cancer treated with lumpectomy radiation and 5 years of antiestrogen therapy completed in December 2015. She is doing very well without any new problems or concerns. Denies any lumps or nodules in the breasts.   patient enjoys traveling and visits [place] in the [place] once or twice a year.  REVIEW OF SYSTEMS:   Constitutional: Denies fevers, chills or abnormal weight loss Eyes: Denies blurriness of vision Ears, nose, mouth, throat, and face: Denies mucositis or sore throat Respiratory: Denies cough, dyspnea or wheezes Cardiovascular: Denies palpitation, chest discomfort or lower extremity swelling Gastrointestinal:  Denies nausea, heartburn or change in bowel habits Skin: Denies abnormal skin rashes Lymphatics: Denies new lymphadenopathy or easy bruising Neurological:Denies numbness, tingling or new weaknesses Behavioral/Psych: Mood is stable, no new changes  Breast:  denies any pain or lumps or nodules in either breasts All other systems were reviewed with the patient and are negative.  I  have reviewed the past medical history, past surgical history, social history and family history with the patient and they are unchanged from previous note.  ALLERGIES:  is allergic to neomycin-bacitracin zn-polymyx; propoxyphene hcl; and darvon.  MEDICATIONS:  Current Outpatient Prescriptions  Medication Sig Dispense Refill  . latanoprost (XALATAN) 0.005 % ophthalmic solution Place 1 drop into both eyes at bedtime.    Marland Kitchen levothyroxine (SYNTHROID, LEVOTHROID) 100 MCG tablet TAKE ONE TABLET BY MOUTH ONCE DAILY 90 tablet 0  . Multiple Vitamins-Minerals (MULTIVITAMIN PO) Take by mouth daily.    Marland Kitchen OVER THE COUNTER MEDICATION OTC Fiber Gummies    . VITAMIN D, CHOLECALCIFEROL, PO Take 2,000 Int'l Units by mouth.     . Wheat Dextrin (BENEFIBER) POWD Take 2 scoop by mouth daily.    Marland Kitchen atorvastatin (LIPITOR) 80 MG tablet Take 1 tablet daily or as directed for cholesterol-  Pt takes Tues Thurs & Sat    . loratadine (CLARITIN) 10 MG tablet Take 10 mg by mouth daily.     No current facility-administered medications for this visit.    PHYSICAL EXAMINATION: ECOG PERFORMANCE STATUS: 0 - Asymptomatic  Filed Vitals:   07/25/14 1004  BP: 135/59  Pulse: 76  Temp: 98.2 F (36.8 C)  Resp: 18   Filed Weights   07/25/14 1004  Weight: 133 lb (60.328 kg)    GENERAL:alert, no distress and comfortable SKIN: skin color, texture, turgor are normal, no rashes or significant lesions EYES: normal, Conjunctiva are pink and non-injected, sclera clear OROPHARYNX:no exudate, no erythema and lips, buccal mucosa, and tongue normal  NECK: supple, thyroid normal size, non-tender, without nodularity LYMPH:  no palpable lymphadenopathy in the cervical, axillary  or inguinal LUNGS: clear to auscultation and percussion with normal breathing effort HEART: regular rate & rhythm and no murmurs and no lower extremity edema ABDOMEN:abdomen soft, non-tender and normal bowel sounds Musculoskeletal:no cyanosis of digits and no  clubbing  NEURO: alert & oriented x 3 with fluent speech, no focal motor/sensory deficits BREAST: No palpable masses or nodules in either right or left breasts.  The left breast the slightly deformed from the surgery and scar but no palpable nodules. No palpable axillary supraclavicular or infraclavicular adenopathy no breast tenderness or nipple discharge. (exam performed in the presence of a chaperone)  LABORATORY DATA:  I have reviewed the data as listed   Chemistry      Component Value Date/Time   NA 140 06/08/2014 0932   K 4.0 06/08/2014 0932   CL 104 06/08/2014 0932   CO2 26 06/08/2014 0932   BUN 16 06/08/2014 0932   CREATININE 0.78 06/08/2014 0932   CREATININE 0.87 06/26/2011 0942      Component Value Date/Time   CALCIUM 9.4 06/08/2014 0932   ALKPHOS 75 06/08/2014 0932   AST 15 06/08/2014 0932   ALT 11 06/08/2014 0932   BILITOT 0.4 06/08/2014 0932       Lab Results  Component Value Date   WBC 7.3 06/08/2014   HGB 12.9 06/08/2014   HCT 39.5 06/08/2014   MCV 91.6 06/08/2014   PLT 260 06/08/2014   NEUTROABS 4.7 06/08/2014     RADIOGRAPHIC STUDIES: I have personally reviewed the radiology reports and agreed with their findings.  mammograms October 2015  At [facility] were normal  ASSESSMENT & PLAN:  Cancer of left breast 3 mm invasive ductal carcinoma with DCIS and calcifications/necrosis, stage 1, T1a, N0, ER 100%, PR 0%, Ki-67 10%, HER2/neu negative by CISH with a ratio 0.87. Status post left press lumpectomy with sentinel node excision and re-excision of medial margin on 11/23/2008, which showed no residual invasive carcinoma identified, pTX, pN0,  0/3 positive lymph nodes. Status post radiation therapy from 01/05/2009 until 02/17/2009. The patient started antiestrogen therapy with Tamoxifen from 02/2009 through 06/2011. The patient then switched antiestrogen therapy to Arimidex in 06/2011  Completed December 2015  Anti-estrogen Toxicities: Vaginal dryness  But  overall had no major toxicities.  completed therapy.  Breast Cancer Surveillance: 1. Breast exam 07/24/14: Normal 2. Mammogram  12/21/13 No abnormalities. Postsurgical changes. Breast Density Category B. I recommended that she get 3-D mammograms for surveillance. Discussed the differences between different breast density categories.   Return to clinic 1 year for follow-up of the survivorship nurse practitioner   No orders of the defined types were placed in this encounter.   The patient has a good understanding of the overall plan. she agrees with it. she will call with any problems that may develop before the next visit here.   Gudena, Vinay K, MD    

## 2014-07-25 NOTE — Addendum Note (Signed)
Addended by: Wyline Beady A on: 07/25/2014 04:40 PM   Modules accepted: Medications

## 2014-07-25 NOTE — Telephone Encounter (Signed)
avs printed for patient and an in basket to gretchen for one year survivorship

## 2014-08-08 ENCOUNTER — Ambulatory Visit (INDEPENDENT_AMBULATORY_CARE_PROVIDER_SITE_OTHER): Payer: Medicare HMO | Admitting: Internal Medicine

## 2014-08-08 ENCOUNTER — Encounter: Payer: Self-pay | Admitting: Internal Medicine

## 2014-08-08 VITALS — BP 138/64 | HR 88 | Temp 98.2°F | Resp 16 | Ht 61.0 in | Wt 133.0 lb

## 2014-08-08 DIAGNOSIS — W57XXXA Bitten or stung by nonvenomous insect and other nonvenomous arthropods, initial encounter: Secondary | ICD-10-CM

## 2014-08-08 DIAGNOSIS — S30861A Insect bite (nonvenomous) of abdominal wall, initial encounter: Secondary | ICD-10-CM

## 2014-08-08 MED ORDER — TRIAMCINOLONE ACETONIDE 0.1 % EX CREA: 1.0000 "application " | TOPICAL_CREAM | Freq: Three times a day (TID) | CUTANEOUS | Status: DC

## 2014-08-08 NOTE — Patient Instructions (Signed)
Tick Bite Information Ticks are insects that attach themselves to the skin and draw blood for food. There are various types of ticks. Common types include wood ticks and deer ticks. Most ticks live in shrubs and grassy areas. Ticks can climb onto your body when you make contact with leaves or grass where the tick is waiting. The most common places on the body for ticks to attach themselves are the scalp, neck, armpits, waist, and groin. Most tick bites are harmless, but sometimes ticks carry germs that cause diseases. These germs can be spread to a person during the tick's feeding process. The chance of a disease spreading through a tick bite depends on:   The type of tick.  Time of year.   How long the tick is attached.   Geographic location.  HOW CAN YOU PREVENT TICK BITES? Take these steps to help prevent tick bites when you are outdoors:  Wear protective clothing. Long sleeves and long pants are best.   Wear white clothes so you can see ticks more easily.  Tuck your pant legs into your socks.   If walking on a trail, stay in the middle of the trail to avoid brushing against bushes.  Avoid walking through areas with long grass.  Put insect repellent on all exposed skin and along boot tops, pant legs, and sleeve cuffs.   Check clothing, hair, and skin repeatedly and before going inside.   Brush off any ticks that are not attached.  Take a shower or bath as soon as possible after being outdoors.  WHAT IS THE PROPER WAY TO REMOVE A TICK? Ticks should be removed as soon as possible to help prevent diseases caused by tick bites. 1. If latex gloves are available, put them on before trying to remove a tick.  2. Using fine-point tweezers, grasp the tick as close to the skin as possible. You may also use curved forceps or a tick removal tool. Grasp the tick as close to its head as possible. Avoid grasping the tick on its body. 3. Pull gently with steady upward pressure until  the tick lets go. Do not twist the tick or jerk it suddenly. This may break off the tick's head or mouth parts. 4. Do not squeeze or crush the tick's body. This could force disease-carrying fluids from the tick into your body.  5. After the tick is removed, wash the bite area and your hands with soap and water or other disinfectant such as alcohol. 6. Apply a small amount of antiseptic cream or ointment to the bite site.  7. Wash and disinfect any instruments that were used.  Do not try to remove a tick by applying a hot match, petroleum jelly, or fingernail polish to the tick. These methods do not work and may increase the chances of disease being spread from the tick bite.  WHEN SHOULD YOU SEEK MEDICAL CARE? Contact your health care provider if you are unable to remove a tick from your skin or if a part of the tick breaks off and is stuck in the skin.  After a tick bite, you need to be aware of signs and symptoms that could be related to diseases spread by ticks. Contact your health care provider if you develop any of the following in the days or weeks after the tick bite:  Unexplained fever.  Rash. A circular rash that appears days or weeks after the tick bite may indicate the possibility of Lyme disease. The rash may resemble   a target with a bull's-eye and may occur at a different part of your body than the tick bite.  Redness and swelling in the area of the tick bite.   Tender, swollen lymph glands.   Diarrhea.   Weight loss.   Cough.   Fatigue.   Muscle, joint, or bone pain.   Abdominal pain.   Headache.   Lethargy or a change in your level of consciousness.  Difficulty walking or moving your legs.   Numbness in the legs.   Paralysis.  Shortness of breath.   Confusion.   Repeated vomiting.  Document Released: 03/01/2000 Document Revised: 12/23/2012 Document Reviewed: 08/12/2012 Exit[employer] Patient Information 2015 [employer]. This information is  not intended to replace advice given to you by your health care provider. Make sure you discuss any questions you have with your health care provider.  

## 2014-08-08 NOTE — Progress Notes (Signed)
   Subjective:    Patient ID: Haley Shah, female    DOB: [birth year], 74 y.o.   MRN: [id number]  HPI  Patient reports to the office for evaluation of tick bite.  Patient reports that they found the tick on the 14th of this month and removed it.  Since that time the area from where they removed the tick has been itchy and red.  There  Has been no drainage.    Review of Systems  Constitutional: Negative for fever, chills and diaphoresis.  Gastrointestinal: Negative for nausea and vomiting.  Skin: Negative for rash.       Objective:   Physical Exam  Constitutional: She is oriented to person, place, and time. She appears well-developed and well-nourished. No distress.  HENT:  Head: Normocephalic and atraumatic.  Mouth/Throat: Oropharynx is clear and moist. No oropharyngeal exudate.  Eyes: Conjunctivae and EOM are normal. Pupils are equal, round, and reactive to light. No scleral icterus.  Neck: Normal range of motion. Neck supple. No JVD present. No thyromegaly present.  Cardiovascular: Normal rate, regular rhythm, normal heart sounds and intact distal pulses.  Exam reveals no gallop and no friction rub.   No murmur heard. Pulmonary/Chest: Effort normal and breath sounds normal. No respiratory distress. She has no wheezes. She has no rales. She exhibits no tenderness.  Abdominal: Soft. Bowel sounds are normal. She exhibits no distension and no mass. There is no tenderness. There is no rebound and no guarding.  Musculoskeletal: Normal range of motion.  Lymphadenopathy:    She has no cervical adenopathy.  Neurological: She is alert and oriented to person, place, and time.  Skin: Skin is warm and dry. She is not diaphoretic.     Psychiatric: She has a normal mood and affect. Her behavior is normal. Judgment and thought content normal.  Nursing note and vitals reviewed.         Assessment & Plan:    1. Tick bite of abdomen, initial encounter -exam not currently consistent  with RMSF or Lyme disease.  Appears to look more like localized reaction to bug bite.  Discussed option with patient as far as treating with prophylactic doxycycline.  She would prefer to wait.  She will call if she develops rash, fever, flu like syndrome.  Will then call in doxy.    - triamcinolone cream (KENALOG) 0.1 %; Apply 1 application topically 3 (three) times daily.  Dispense: 30 g; Refill: 0

## 2014-08-22 ENCOUNTER — Other Ambulatory Visit: Payer: Self-pay | Admitting: Internal Medicine

## 2014-09-02 ENCOUNTER — Other Ambulatory Visit: Payer: Self-pay | Admitting: Internal Medicine

## 2014-09-12 ENCOUNTER — Other Ambulatory Visit: Payer: Self-pay

## 2014-09-15 ENCOUNTER — Ambulatory Visit: Payer: Self-pay | Admitting: Internal Medicine

## 2014-10-03 ENCOUNTER — Ambulatory Visit (INDEPENDENT_AMBULATORY_CARE_PROVIDER_SITE_OTHER): Payer: Medicare HMO | Admitting: Internal Medicine

## 2014-10-03 ENCOUNTER — Encounter: Payer: Self-pay | Admitting: Internal Medicine

## 2014-10-03 VITALS — BP 152/84 | HR 66 | Temp 98.2°F | Resp 16 | Ht 61.0 in | Wt 131.0 lb

## 2014-10-03 DIAGNOSIS — Z79899 Other long term (current) drug therapy: Secondary | ICD-10-CM

## 2014-10-03 DIAGNOSIS — E782 Mixed hyperlipidemia: Secondary | ICD-10-CM

## 2014-10-03 DIAGNOSIS — E559 Vitamin D deficiency, unspecified: Secondary | ICD-10-CM

## 2014-10-03 DIAGNOSIS — R7309 Other abnormal glucose: Secondary | ICD-10-CM

## 2014-10-03 DIAGNOSIS — IMO0001 Reserved for inherently not codable concepts without codable children: Secondary | ICD-10-CM

## 2014-10-03 DIAGNOSIS — E039 Hypothyroidism, unspecified: Secondary | ICD-10-CM

## 2014-10-03 DIAGNOSIS — R7303 Prediabetes: Secondary | ICD-10-CM

## 2014-10-03 DIAGNOSIS — Z789 Other specified health status: Secondary | ICD-10-CM

## 2014-10-03 DIAGNOSIS — I10 Essential (primary) hypertension: Secondary | ICD-10-CM

## 2014-10-03 LAB — BASIC METABOLIC PANEL WITH GFR
BUN: 17 mg/dL (ref 6–23)
CHLORIDE: 103 meq/L (ref 96–112)
CO2: 25 meq/L (ref 19–32)
CREATININE: 0.89 mg/dL (ref 0.50–1.10)
Calcium: 9.2 mg/dL (ref 8.4–10.5)
GFR, Est African American: 73 mL/min
GFR, Est Non African American: 64 mL/min
GLUCOSE: 89 mg/dL (ref 70–99)
Potassium: 4.2 mEq/L (ref 3.5–5.3)
SODIUM: 141 meq/L (ref 135–145)

## 2014-10-03 LAB — CBC WITH DIFFERENTIAL/PLATELET
BASOS ABS: 0.1 10*3/uL (ref 0.0–0.1)
Basophils Relative: 1 % (ref 0–1)
EOS PCT: 2 % (ref 0–5)
Eosinophils Absolute: 0.1 10*3/uL (ref 0.0–0.7)
HCT: 39.9 % (ref 36.0–46.0)
Hemoglobin: 13.5 g/dL (ref 12.0–15.0)
LYMPHS ABS: 2 10*3/uL (ref 0.7–4.0)
Lymphocytes Relative: 28 % (ref 12–46)
MCH: 30.3 pg (ref 26.0–34.0)
MCHC: 33.8 g/dL (ref 30.0–36.0)
MCV: 89.7 fL (ref 78.0–100.0)
MPV: 8.9 fL (ref 8.6–12.4)
Monocytes Absolute: 0.8 10*3/uL (ref 0.1–1.0)
Monocytes Relative: 11 % (ref 3–12)
NEUTROS ABS: 4.1 10*3/uL (ref 1.7–7.7)
Neutrophils Relative %: 58 % (ref 43–77)
Platelets: 270 10*3/uL (ref 150–400)
RBC: 4.45 MIL/uL (ref 3.87–5.11)
RDW: 15.3 % (ref 11.5–15.5)
WBC: 7 10*3/uL (ref 4.0–10.5)

## 2014-10-03 LAB — MAGNESIUM: Magnesium: 2.2 mg/dL (ref 1.5–2.5)

## 2014-10-03 LAB — HEPATIC FUNCTION PANEL
ALK PHOS: 66 U/L (ref 39–117)
ALT: 14 U/L (ref 0–35)
AST: 19 U/L (ref 0–37)
Albumin: 4.5 g/dL (ref 3.5–5.2)
BILIRUBIN INDIRECT: 0.3 mg/dL (ref 0.2–1.2)
Bilirubin, Direct: 0.1 mg/dL (ref 0.0–0.3)
Total Bilirubin: 0.4 mg/dL (ref 0.2–1.2)
Total Protein: 7 g/dL (ref 6.0–8.3)

## 2014-10-03 LAB — TSH: TSH: 12.888 u[IU]/mL — ABNORMAL HIGH (ref 0.350–4.500)

## 2014-10-03 LAB — HEMOGLOBIN A1C
HEMOGLOBIN A1C: 5.6 % (ref <5.7)
MEAN PLASMA GLUCOSE: 114 mg/dL (ref <117)

## 2014-10-03 LAB — LIPID PANEL
Cholesterol: 212 mg/dL — ABNORMAL HIGH (ref 0–200)
HDL: 56 mg/dL (ref >=46)
LDL Cholesterol: 131 mg/dL — ABNORMAL HIGH (ref 0–99)
TRIGLYCERIDES: 125 mg/dL (ref <150)
Total CHOL/HDL Ratio: 3.8 Ratio
VLDL: 25 mg/dL (ref 0–40)

## 2014-10-03 NOTE — Patient Instructions (Signed)
You can buy Tumeric with black pepper extract to help with some of your joint pain.  This is available at [facility] if you are a member there.  If you do not have any relief with 10 mg of melatonin let me know and we do have other options we can try to help you sleep.

## 2014-10-03 NOTE — Progress Notes (Signed)
Assessment and Plan:  Hypertension:  -Continue medication,  -monitor blood pressure at home.  -Continue DASH diet.   -Reminder to go to the ER if any CP, SOB, nausea, dizziness, severe HA, changes vision/speech, left arm numbness and tingling, and jaw pain.  Cholesterol: -change to morning dosage. -Continue diet and exercise.  -Check cholesterol.   Pre-diabetes: -Continue diet and exercise.  -Check A1C  Vitamin D Def: -check level -continue medications.   Joint Pains -tumeric -think about possible TKA  Continue diet and meds as discussed. Further disposition pending results of labs.  HPI 74 y.o. female  presents for 3 month follow up with hypertension, hyperlipidemia, prediabetes and vitamin D.   Her blood pressure has been controlled at home, today their BP is BP: (!) 152/84 mmHg.   She does not workout. She denies chest pain, shortness of breath, dizziness.  She reports that her knees are very painful today and she thinks that this is causing her BP to be up.     She is on cholesterol medication and denies myalgias. Her cholesterol is not at goal. The cholesterol last visit was:   Lab Results  Component Value Date   CHOL 178 06/08/2014   HDL 44* 06/08/2014   LDLCALC 113* 06/08/2014   TRIG 105 06/08/2014   CHOLHDL 4.0 06/08/2014  She also reports that she is not taking her medication as she is supposed to.  She feels that the medicaiton causes her to not sleep well and she also thinks that it is contributing to her leg pains.    She has not been working on diet and exercise for prediabetes, and denies foot ulcerations, hyperglycemia, hypoglycemia , increased appetite, nausea, paresthesia of the feet, polydipsia, polyuria, visual disturbances, vomiting and weight loss. Last A1C in the office was:  Lab Results  Component Value Date   HGBA1C 5.4 06/08/2014    Patient is on Vitamin D supplement.  Lab Results  Component Value Date   VD25OH 52 06/08/2014      Current  Medications:  Current Outpatient Prescriptions on File Prior to Visit  Medication Sig Dispense Refill  . aspirin 81 MG tablet Take 81 mg by mouth daily.    Marland Kitchen atorvastatin (LIPITOR) 80 MG tablet TAKE 1 TABLET DAILY OR AS DIRECTED FOR CHOLESTEROL 30 tablet 5  . latanoprost (XALATAN) 0.005 % ophthalmic solution Place 1 drop into both eyes at bedtime.    Marland Kitchen levothyroxine (SYNTHROID, LEVOTHROID) 100 MCG tablet TAKE ONE TABLET BY MOUTH ONCE DAILY 90 tablet 1  . meloxicam (MOBIC) 15 MG tablet Take 15 mg by mouth as needed for pain.    . Multiple Vitamins-Minerals (MULTIVITAMIN PO) Take by mouth daily.    Marland Kitchen OVER THE COUNTER MEDICATION OTC Fiber Gummies    . triamcinolone cream (KENALOG) 0.1 % Apply 1 application topically 3 (three) times daily. 30 g 0  . VITAMIN D, CHOLECALCIFEROL, PO Take 2,000 Int'l Units by mouth.     . Wheat Dextrin (BENEFIBER) POWD Take 2 scoop by mouth daily.     No current facility-administered medications on file prior to visit.    Medical History:  Past Medical History  Diagnosis Date  . GERD (gastroesophageal reflux disease)   . History of thyroid cancer   . IBS (irritable bowel syndrome)   . Sciatica     RLE  . Hyperlipidemia   . Cancer 2010    Left Breast  . Vitamin D deficiency   . Pre-diabetes   . Allergy   .  Thyroid disease     Hypo  . Glaucoma   . Hypertension     no meds  . Dyspnea     chronic-no cardiac reason  . Wears glasses     Allergies:  Allergies  Allergen Reactions  . Neomycin-Bacitracin Zn-Polymyx     REACTION: rash  . Propoxyphene Hcl     REACTION: syncope  . Darvon Other (See Comments)    Almost fainted when taking, over 50 years.     Review of Systems:  Review of Systems  Constitutional: Positive for malaise/fatigue. Negative for fever and chills.  HENT: Negative for congestion, ear discharge and sore throat.   Eyes: Negative.   Respiratory: Negative for cough, shortness of breath and wheezing.   Cardiovascular: Negative  for chest pain, palpitations and leg swelling.  Gastrointestinal: Negative for heartburn, abdominal pain, blood in stool and melena.  Genitourinary: Negative for dysuria, urgency and frequency.  Musculoskeletal: Positive for joint pain.  Skin: Negative.   Neurological: Negative for dizziness, sensory change, loss of consciousness and headaches.  Psychiatric/Behavioral: Negative for depression. The patient has insomnia. The patient is not nervous/anxious.     Family history- Review and unchanged  Social history- Review and unchanged  Physical Exam: BP 152/84 mmHg  Pulse 66  Temp(Src) 98.2 F (36.8 C) (Temporal)  Resp 16  Ht 5\' 1"  (1.549 m)  Wt 131 lb (59.421 kg)  BMI 24.76 kg/m2 Wt Readings from Last 3 Encounters:  10/03/14 131 lb (59.421 kg)  08/08/14 133 lb (60.328 kg)  07/25/14 133 lb (60.328 kg)    General Appearance: Well nourished well developed, in no apparent distress. Eyes: PERRLA, EOMs, conjunctiva no swelling or erythema ENT/Mouth: Ear canals normal without obstruction, swelling, erythma, discharge.  TMs normal bilaterally.  Oropharynx moist, clear, without exudate, or postoropharyngeal swelling. Neck: Supple, thyroid normal,no cervical adenopathy  Respiratory: Respiratory effort normal, Breath sounds clear A&P without rhonchi, wheeze, or rale.  No retractions, no accessory usage. Cardio: RRR with no MRGs. Brisk peripheral pulses without edema.  Abdomen: Soft, + BS,  Non tender, no guarding, rebound, hernias, masses. Musculoskeletal: Full ROM, 5/5 strength, Normal gait Skin: Warm, dry without rashes, lesions, ecchymosis.  Neuro: Awake and oriented X 3, Cranial nerves intact. Normal muscle tone, no cerebellar symptoms. Psych: Normal affect, Insight and Judgment appropriate.    Starlyn Skeans, PA-C 12:01 PM [facility]

## 2014-10-04 LAB — VITAMIN D 25 HYDROXY (VIT D DEFICIENCY, FRACTURES): Vit D, 25-Hydroxy: 70 ng/mL (ref 30–100)

## 2014-10-04 LAB — INSULIN, RANDOM: INSULIN: 5.2 u[IU]/mL (ref 2.0–19.6)

## 2014-10-06 ENCOUNTER — Encounter: Payer: Self-pay | Admitting: Internal Medicine

## 2014-11-24 ENCOUNTER — Encounter: Payer: Self-pay | Admitting: Internal Medicine

## 2014-11-29 ENCOUNTER — Other Ambulatory Visit: Payer: Self-pay | Admitting: Internal Medicine

## 2014-11-30 ENCOUNTER — Other Ambulatory Visit: Payer: PPO

## 2014-11-30 DIAGNOSIS — E039 Hypothyroidism, unspecified: Secondary | ICD-10-CM

## 2014-12-01 LAB — TSH: TSH: 0.04 u[IU]/mL — AB (ref 0.350–4.500)

## 2014-12-16 ENCOUNTER — Ambulatory Visit (INDEPENDENT_AMBULATORY_CARE_PROVIDER_SITE_OTHER): Payer: Medicare HMO | Admitting: Physician Assistant

## 2014-12-16 ENCOUNTER — Encounter: Payer: Self-pay | Admitting: Physician Assistant

## 2014-12-16 VITALS — BP 124/70 | HR 71 | Temp 97.5°F | Resp 14 | Ht 61.0 in | Wt 138.0 lb

## 2014-12-16 DIAGNOSIS — J0100 Acute maxillary sinusitis, unspecified: Secondary | ICD-10-CM | POA: Diagnosis not present

## 2014-12-16 MED ORDER — PREDNISONE 20 MG PO TABS: ORAL_TABLET | ORAL | Status: DC

## 2014-12-16 MED ORDER — AZITHROMYCIN 250 MG PO TABS: ORAL_TABLET | ORAL | Status: AC

## 2014-12-16 NOTE — Patient Instructions (Signed)
Sinusitis can be uncomfortable. People with sinusitis have congestion with yellow/green/gray discharge, sinus pain/pressure, pain around the eyes. Sinus infections almost ALWAYS stem from a viral infection and antibiotics don't work against a virus. Even when bacteria is responsible, the infections usually clear up on their own in a week or so.   PLEASE TRY TO DO OVER THE COUNTER TREATMENT AND PREDNISONE FOR 5-7 DAYS AND IF YOU ARE NOT GETTING BETTER OR GETTING WORSE THEN YOU CAN START ON AN ANTIBIOTIC GIVEN.  Can take the prednisone AT NIGHT WITH DINNER, it take 8-12 hours to start working so it will NOT affect your sleeping if you take it at night with your food!! Take two pills the first night and 1 or two pill the second night and then 1 pill the other nights.   Risk of antibiotic use: About 1 in 4 people who take antibiotics have side effects including stomach problems, dizziness, or rashes. Those problems clear up soon after stopping the drugs, but in rare cases antibiotics can cause severe allergic reaction. Over use of antibiotics also encourages the growth of bacteria that can't be controlled easily with drugs. That makes you more vunerable to antibiotic-resistant infections and undermines the benefits of antibiotics for others.   Waste of Money: Antibiotics often aren't very expensive, but any money spent on unnecessary drugs is money down the drain.   When are antibiotics needed? Only when symptoms last longer than a week.  Start to improve but then worsen again  -It can take up to 2 weeks to feel better.   -If you do not get better in 7-10 days (Have fever, facial pain, dental pain and swelling), then please call the office and it is now appropriate to start an antibiotic.   -Please take Tylenol or Ibuprofen for pain. -Acetaminiphen 325mg orally every 4-6 hours for pain.  Max: 10 per day -Ibuprofen 200mg orally every 6-8 hours for pain.  Take with food to avoid ulcers.   Max 10 per  day  Please pick one of the over the counter allergy medications below and take it once daily for allergies.  Claritin or loratadine cheapest but likely the weakest  Zyrtec or certizine at night because it can make you sleepy The strongest is allegra or fexafinadine  Cheapest at [employer], [employer], [employer]  -While drinking fluids, pinch and hold nose close and swallow.  This will help open up your eustachian tubes to drain the fluid behind your ear drums. -Try steam showers to open your nasal passages.   Drink lots of water to stay hydrated and to thin mucous.  Flonase/Nasonex is to help the inflammation.  Take 2 sprays in each nostril at bedtime.  Make sure you spray towards the outside of each nostril towards the outer corner of your eye, hold nose close and tilt head back.  This will help the medication get into your sinuses.  If you do not like this medication, then use saline nasal sprays same directions as above for Flonase. Stop the medication right away if you get blurring of your vision or nose bleeds.  Sinusitis Sinusitis is redness, soreness, and inflammation of the paranasal sinuses. Paranasal sinuses are air pockets within the bones of your face (beneath the eyes, the middle of the forehead, or above the eyes). In healthy paranasal sinuses, mucus is able to drain out, and air is able to circulate through them by way of your nose. However, when your paranasal sinuses are inflamed, mucus and air can   become trapped. This can allow bacteria and other germs to grow and cause infection. Sinusitis can develop quickly and last only a short time (acute) or continue over a long period (chronic). Sinusitis that lasts for more than 12 weeks is considered chronic.  CAUSES  Causes of sinusitis include: Allergies. Structural abnormalities, such as displacement of the cartilage that separates your nostrils (deviated septum), which can decrease the air flow through your nose and sinuses and affect sinus  drainage. Functional abnormalities, such as when the small hairs (cilia) that line your sinuses and help remove mucus do not work properly or are not present. SIGNS AND SYMPTOMS  Symptoms of acute and chronic sinusitis are the same. The primary symptoms are pain and pressure around the affected sinuses. Other symptoms include: Upper toothache. Earache. Headache. Bad breath. Decreased sense of smell and taste. A cough, which worsens when you are lying flat. Fatigue. Fever. Thick drainage from your nose, which often is green and may contain pus (purulent). Swelling and warmth over the affected sinuses. DIAGNOSIS  Your health care Harriet Sutphen will perform a physical exam. During the exam, your health care Geneva Pallas may: Look in your nose for signs of abnormal growths in your nostrils (nasal polyps).  Tap over the affected sinus to check for signs of infection. View the inside of your sinuses (endoscopy) using an imaging device that has a light attached (endoscope). If your health care Bellamy Judson suspects that you have chronic sinusitis, one or more of the following tests may be recommended: Allergy tests. Nasal culture. A sample of mucus is taken from your nose, sent to a lab, and screened for bacteria. Nasal cytology. A sample of mucus is taken from your nose and examined by your health care Oluwasemilore Bahl to determine if your sinusitis is related to an allergy. TREATMENT  Most cases of acute sinusitis are related to a viral infection and will resolve on their own within 10 days. Sometimes medicines are prescribed to help relieve symptoms (pain medicine, decongestants, nasal steroid sprays, or saline sprays).  However, for sinusitis related to a bacterial infection, your health care Phyllip Claw will prescribe antibiotic medicines. These are medicines that will help kill the bacteria causing the infection.  Rarely, sinusitis is caused by a fungal infection. In theses cases, your health care Xaivier Malay will  prescribe antifungal medicine. For some cases of chronic sinusitis, surgery is needed. Generally, these are cases in which sinusitis recurs more than 3 times per year, despite other treatments. HOME CARE INSTRUCTIONS  Drink plenty of water. Water helps thin the mucus so your sinuses can drain more easily. Use a humidifier. Inhale steam 3 to 4 times a day (for example, sit in the bathroom with the shower running). Apply a warm, moist washcloth to your face 3 to 4 times a day, or as directed by your health care Joshaua Epple. Use saline nasal sprays to help moisten and clean your sinuses. Take medicines only as directed by your health care Kallin Henk. If you were prescribed either an antibiotic or antifungal medicine, finish it all even if you start to feel better. SEEK IMMEDIATE MEDICAL CARE IF: You have increasing pain or severe headaches. You have nausea, vomiting, or drowsiness. You have swelling around your face. You have vision problems. You have a stiff neck. You have difficulty breathing. MAKE SURE YOU:  Understand these instructions. Will watch your condition. Will get help right away if you are not doing well or get worse. Document Released: 03/04/2005 Document Revised: 07/19/2013 Document Reviewed: 03/19/2011 [employer]   Patient Information 2015 [employer]. This information is not intended to replace advice given to you by your health care Callaway Hailes. Make sure you discuss any questions you have with your health care Ahsan Esterline.   

## 2014-12-16 NOTE — Progress Notes (Signed)
Subjective:    Patient ID: Haley Shah, female    DOB: May 24, 1940, 74 y.o.   MRN: [id number]  HPI 74 y.o. WF with history of GERD, chol, left breast CA, preDM, allergies, HTN, hypothyroidism presents with sinusitis x 1-2 weeks. States was worse, treated OTC but then got worse for 3-4 days. Has sinus pressure with clear mucus, nonproductive cough, denies fatigue, fever, chills.    Blood pressure 124/70, pulse 71, temperature 97.5 F (36.4 C), temperature source Temporal, resp. rate 14, height 5\' 1"  (1.549 m), weight 138 lb (62.596 kg), SpO2 98 %.  Past Medical History  Diagnosis Date  . GERD (gastroesophageal reflux disease)   . History of thyroid cancer   . IBS (irritable bowel syndrome)   . Sciatica     RLE  . Hyperlipidemia   . Cancer 2010    Left Breast  . Vitamin D deficiency   . Pre-diabetes   . Allergy   . Thyroid disease     Hypo  . Glaucoma   . Hypertension     no meds  . Dyspnea     chronic-no cardiac reason  . Wears glasses    Current Outpatient Prescriptions on File Prior to Visit  Medication Sig Dispense Refill  . aspirin 81 MG tablet Take 81 mg by mouth daily.    Marland Kitchen atorvastatin (LIPITOR) 80 MG tablet TAKE 1 TABLET DAILY OR AS DIRECTED FOR CHOLESTEROL 30 tablet 5  . latanoprost (XALATAN) 0.005 % ophthalmic solution Place 1 drop into both eyes at bedtime.    Marland Kitchen levothyroxine (SYNTHROID, LEVOTHROID) 100 MCG tablet TAKE ONE TABLET BY MOUTH ONCE DAILY 90 tablet 1  . meloxicam (MOBIC) 15 MG tablet Take 15 mg by mouth as needed for pain.    . Multiple Vitamins-Minerals (MULTIVITAMIN PO) Take by mouth daily.    Marland Kitchen OVER THE COUNTER MEDICATION OTC Fiber Gummies    . OVER THE COUNTER MEDICATION daily. VitaFusion Women's Multivitamin    . VITAMIN D, CHOLECALCIFEROL, PO Take 2,000 Int'l Units by mouth.     . Wheat Dextrin (BENEFIBER) POWD Take 2 scoop by mouth daily.     No current facility-administered medications on file prior to visit.   Review of Systems   Constitutional: Negative for chills and diaphoresis.  HENT: Positive for congestion, postnasal drip, sinus pressure and sneezing. Negative for ear pain and sore throat.   Respiratory: Positive for cough. Negative for chest tightness, shortness of breath and wheezing.   Cardiovascular: Negative.   Gastrointestinal: Negative.   Genitourinary: Negative.   Musculoskeletal: Negative for neck pain.  Neurological: Positive for headaches.       Objective:   Physical Exam  Constitutional: She appears well-developed and well-nourished.  HENT:  Head: Normocephalic and atraumatic.  Right Ear: External ear normal.  Nose: Right sinus exhibits maxillary sinus tenderness. Right sinus exhibits no frontal sinus tenderness. Left sinus exhibits maxillary sinus tenderness. Left sinus exhibits no frontal sinus tenderness.  Eyes: Conjunctivae and EOM are normal.  Neck: Normal range of motion. Neck supple.  Cardiovascular: Normal rate, regular rhythm, normal heart sounds and intact distal pulses.   Pulmonary/Chest: Effort normal and breath sounds normal. No respiratory distress. She has no wheezes.  Abdominal: Soft. Bowel sounds are normal.  Lymphadenopathy:    She has cervical adenopathy.  Skin: Skin is warm and dry.       Assessment & Plan:  1. Acute maxillary sinusitis, recurrence not specified - predniSONE (DELTASONE) 20 MG tablet; 2 tablets daily  for 3 days, 1 tablet daily for 4 days.  Dispense: 10 tablet; Refill: 0 - azithromycin (ZITHROMAX) 250 MG tablet; Take 2 tablets (500 mg) on  Day 1,  followed by 1 tablet (250 mg) once daily on Days 2 through 5.  Dispense: 6 each; Refill: 1

## 2014-12-30 ENCOUNTER — Ambulatory Visit: Payer: Self-pay | Admitting: Internal Medicine

## 2015-01-05 ENCOUNTER — Ambulatory Visit (INDEPENDENT_AMBULATORY_CARE_PROVIDER_SITE_OTHER): Payer: Medicare HMO | Admitting: Internal Medicine

## 2015-01-05 VITALS — BP 148/68 | HR 65 | Resp 16 | Wt 125.6 lb

## 2015-01-05 DIAGNOSIS — E782 Mixed hyperlipidemia: Secondary | ICD-10-CM | POA: Diagnosis not present

## 2015-01-05 DIAGNOSIS — I10 Essential (primary) hypertension: Secondary | ICD-10-CM | POA: Diagnosis not present

## 2015-01-05 DIAGNOSIS — Z23 Encounter for immunization: Secondary | ICD-10-CM

## 2015-01-05 DIAGNOSIS — E039 Hypothyroidism, unspecified: Secondary | ICD-10-CM

## 2015-01-05 DIAGNOSIS — R7303 Prediabetes: Secondary | ICD-10-CM

## 2015-01-05 DIAGNOSIS — Z79899 Other long term (current) drug therapy: Secondary | ICD-10-CM | POA: Diagnosis not present

## 2015-01-05 LAB — CBC WITH DIFFERENTIAL/PLATELET
BASOS ABS: 0.1 10*3/uL (ref 0.0–0.1)
BASOS PCT: 1 % (ref 0–1)
EOS ABS: 0.2 10*3/uL (ref 0.0–0.7)
Eosinophils Relative: 3 % (ref 0–5)
HCT: 41 % (ref 36.0–46.0)
Hemoglobin: 13.5 g/dL (ref 12.0–15.0)
Lymphocytes Relative: 25 % (ref 12–46)
Lymphs Abs: 1.6 10*3/uL (ref 0.7–4.0)
MCH: 29.3 pg (ref 26.0–34.0)
MCHC: 32.9 g/dL (ref 30.0–36.0)
MCV: 88.9 fL (ref 78.0–100.0)
MPV: 9.4 fL (ref 8.6–12.4)
Monocytes Absolute: 0.6 10*3/uL (ref 0.1–1.0)
Monocytes Relative: 10 % (ref 3–12)
Neutro Abs: 3.8 10*3/uL (ref 1.7–7.7)
Neutrophils Relative %: 61 % (ref 43–77)
PLATELETS: 257 10*3/uL (ref 150–400)
RBC: 4.61 MIL/uL (ref 3.87–5.11)
RDW: 13.8 % (ref 11.5–15.5)
WBC: 6.3 10*3/uL (ref 4.0–10.5)

## 2015-01-05 LAB — HEPATIC FUNCTION PANEL
ALBUMIN: 4.6 g/dL (ref 3.6–5.1)
ALK PHOS: 71 U/L (ref 33–130)
ALT: 11 U/L (ref 6–29)
AST: 15 U/L (ref 10–35)
Bilirubin, Direct: 0.1 mg/dL (ref <=0.2)
Indirect Bilirubin: 0.5 mg/dL (ref 0.2–1.2)
Total Bilirubin: 0.6 mg/dL (ref 0.2–1.2)
Total Protein: 7 g/dL (ref 6.1–8.1)

## 2015-01-05 LAB — BASIC METABOLIC PANEL WITH GFR
BUN: 13 mg/dL (ref 7–25)
CHLORIDE: 104 mmol/L (ref 98–110)
CO2: 28 mmol/L (ref 20–31)
CREATININE: 0.72 mg/dL (ref 0.60–0.93)
Calcium: 9.7 mg/dL (ref 8.6–10.4)
GFR, Est Non African American: 82 mL/min (ref >=60)
Glucose, Bld: 82 mg/dL (ref 65–99)
Potassium: 4.2 mmol/L (ref 3.5–5.3)
Sodium: 141 mmol/L (ref 135–146)

## 2015-01-05 LAB — TSH: TSH: 0.063 u[IU]/mL — ABNORMAL LOW (ref 0.350–4.500)

## 2015-01-05 MED ORDER — ATORVASTATIN CALCIUM 80 MG PO TABS: ORAL_TABLET | ORAL | Status: DC

## 2015-01-05 NOTE — Addendum Note (Signed)
Addended by: Glenford Garis A on: 01/05/2015 10:33 AM   Modules accepted: Orders

## 2015-01-05 NOTE — Progress Notes (Signed)
Patient ID: JENNAE HAKEEM, female   DOB: [birth year], 74 y.o.   MRN: [id number]  Assessment and Plan:  Hypertension:  -borderline high, continue to monitor at home -Continue medication,  -monitor blood pressure at home.  -Continue DASH diet.   -Reminder to go to the ER if any CP, SOB, nausea, dizziness, severe HA, changes vision/speech, left arm numbness and tingling, and jaw pain.  Cholesterol: -continue morning dosing -refill given -Continue diet and exercise.  -Check cholesterol.   Pre-diabetes: -Continue diet and exercise.  -Check A1C  Vitamin D Def: -check level -continue medications.   Allergic rhinitis -start using flonase  Continue diet and meds as discussed. Further disposition pending results of labs.  HPI 74 y.o. female  presents for 3 month follow up with hypertension, hyperlipidemia, prediabetes and vitamin D.   Her blood pressure has been controlled at home, today their BP is BP: (!) 148/68 mmHg.   She does workout. She denies chest pain, shortness of breath, dizziness.  She reports that she has been walking on trails when they go camping.     She is on cholesterol medication and denies myalgias. Her cholesterol is at goal. The cholesterol last visit was:   Lab Results  Component Value Date   CHOL 212* 10/03/2014   HDL 56 10/03/2014   LDLCALC 131* 10/03/2014   TRIG 125 10/03/2014   CHOLHDL 3.8 10/03/2014     She has been working on diet and exercise for prediabetes, and denies foot ulcerations, hyperglycemia, hypoglycemia , increased appetite, nausea, paresthesia of the feet, polydipsia, polyuria, visual disturbances, vomiting and weight loss. Last A1C in the office was:  Lab Results  Component Value Date   HGBA1C 5.6 10/03/2014    Patient is on Vitamin D supplement.  Lab Results  Component Value Date   VD25OH 45 10/03/2014     Patient reports that she does tend to have some issues with night time congestion.  She reports that the allergy pill isn't  really helping at night time.  She reports that she has not tried anything else for it.   Current Medications:  Current Outpatient Prescriptions on File Prior to Visit  Medication Sig Dispense Refill  . aspirin 81 MG tablet Take 81 mg by mouth daily.    Marland Kitchen atorvastatin (LIPITOR) 80 MG tablet TAKE 1 TABLET DAILY OR AS DIRECTED FOR CHOLESTEROL 30 tablet 5  . latanoprost (XALATAN) 0.005 % ophthalmic solution Place 1 drop into both eyes at bedtime.    Marland Kitchen levothyroxine (SYNTHROID, LEVOTHROID) 100 MCG tablet TAKE ONE TABLET BY MOUTH ONCE DAILY 90 tablet 1  . meloxicam (MOBIC) 15 MG tablet Take 15 mg by mouth as needed for pain.    . Multiple Vitamins-Minerals (MULTIVITAMIN PO) Take by mouth daily.    Marland Kitchen OVER THE COUNTER MEDICATION OTC Fiber Gummies    . OVER THE COUNTER MEDICATION daily. VitaFusion Women's Multivitamin    . predniSONE (DELTASONE) 20 MG tablet 2 tablets daily for 3 days, 1 tablet daily for 4 days. 10 tablet 0  . VITAMIN D, CHOLECALCIFEROL, PO Take 2,000 Int'l Units by mouth.     . Wheat Dextrin (BENEFIBER) POWD Take 2 scoop by mouth daily.     No current facility-administered medications on file prior to visit.    Medical History:  Past Medical History  Diagnosis Date  . GERD (gastroesophageal reflux disease)   . History of thyroid cancer   . IBS (irritable bowel syndrome)   . Sciatica  RLE  . Hyperlipidemia   . Cancer 2010    Left Breast  . Vitamin D deficiency   . Pre-diabetes   . Allergy   . Thyroid disease     Hypo  . Glaucoma   . Hypertension     no meds  . Dyspnea     chronic-no cardiac reason  . Wears glasses     Allergies:  Allergies  Allergen Reactions  . Neomycin-Bacitracin Zn-Polymyx     REACTION: rash  . Propoxyphene Hcl     REACTION: syncope  . Darvon Other (See Comments)    Almost fainted when taking, over 50 years.     Review of Systems:  Review of Systems  Constitutional: Negative for fever, chills and malaise/fatigue.  HENT:  Positive for congestion. Negative for ear pain and sore throat.   Eyes: Negative.   Respiratory: Negative for cough, shortness of breath and wheezing.   Cardiovascular: Negative for chest pain, palpitations and leg swelling.  Gastrointestinal: Negative for heartburn, diarrhea, constipation, blood in stool and melena.  Genitourinary: Negative.   Musculoskeletal: Positive for joint pain.  Skin: Negative.   Neurological: Negative for dizziness, sensory change, loss of consciousness and headaches.  Psychiatric/Behavioral: Negative for depression. The patient has insomnia. The patient is not nervous/anxious.     Family history- Review and unchanged  Social history- Review and unchanged  Physical Exam: BP 148/68 mmHg  Pulse 65  Resp 16  SpO2 97% Wt Readings from Last 3 Encounters:  12/16/14 138 lb (62.596 kg)  10/03/14 131 lb (59.421 kg)  08/08/14 133 lb (60.328 kg)    General Appearance: Well nourished well developed, in no apparent distress. Eyes: PERRLA, EOMs, conjunctiva no swelling or erythema ENT/Mouth: Ear canals normal without obstruction, swelling, erythma, discharge.  TMs normal bilaterally.  Oropharynx moist, clear, without exudate, or postoropharyngeal swelling. Neck: Supple, thyroid normal,no cervical adenopathy  Respiratory: Respiratory effort normal, Breath sounds clear A&P without rhonchi, wheeze, or rale.  No retractions, no accessory usage. Cardio: RRR with no MRGs. Brisk peripheral pulses without edema.  Abdomen: Soft, + BS,  Non tender, no guarding, rebound, hernias, masses. Musculoskeletal: Full ROM, 5/5 strength, Normal gait Skin: Warm, dry without rashes, lesions, ecchymosis.  Neuro: Awake and oriented X 3, Cranial nerves intact. Normal muscle tone, no cerebellar symptoms. Psych: Normal affect, Insight and Judgment appropriate.    Starlyn Skeans, PA-C 9:52 AM [facility]

## 2015-01-06 ENCOUNTER — Encounter: Payer: Self-pay | Admitting: Internal Medicine

## 2015-02-05 ENCOUNTER — Encounter: Payer: Self-pay | Admitting: *Deleted

## 2015-02-23 ENCOUNTER — Encounter: Payer: Self-pay | Admitting: Physician Assistant

## 2015-02-23 ENCOUNTER — Encounter: Payer: Self-pay | Admitting: Internal Medicine

## 2015-02-23 ENCOUNTER — Ambulatory Visit (INDEPENDENT_AMBULATORY_CARE_PROVIDER_SITE_OTHER): Payer: Medicare HMO | Admitting: Physician Assistant

## 2015-02-23 VITALS — BP 118/80 | HR 71 | Temp 97.9°F | Resp 16 | Ht 61.0 in | Wt 124.0 lb

## 2015-02-23 DIAGNOSIS — M899 Disorder of bone, unspecified: Secondary | ICD-10-CM | POA: Diagnosis not present

## 2015-02-23 DIAGNOSIS — Z85850 Personal history of malignant neoplasm of thyroid: Secondary | ICD-10-CM

## 2015-02-23 DIAGNOSIS — R42 Dizziness and giddiness: Secondary | ICD-10-CM

## 2015-02-23 DIAGNOSIS — K588 Other irritable bowel syndrome: Secondary | ICD-10-CM

## 2015-02-23 DIAGNOSIS — E559 Vitamin D deficiency, unspecified: Secondary | ICD-10-CM | POA: Diagnosis not present

## 2015-02-23 DIAGNOSIS — M949 Disorder of cartilage, unspecified: Secondary | ICD-10-CM

## 2015-02-23 DIAGNOSIS — Z79899 Other long term (current) drug therapy: Secondary | ICD-10-CM

## 2015-02-23 DIAGNOSIS — K2100 Gastro-esophageal reflux disease with esophagitis, without bleeding: Secondary | ICD-10-CM

## 2015-02-23 DIAGNOSIS — E782 Mixed hyperlipidemia: Secondary | ICD-10-CM

## 2015-02-23 DIAGNOSIS — Z0001 Encounter for general adult medical examination with abnormal findings: Secondary | ICD-10-CM | POA: Diagnosis not present

## 2015-02-23 DIAGNOSIS — Z853 Personal history of malignant neoplasm of breast: Secondary | ICD-10-CM

## 2015-02-23 DIAGNOSIS — R350 Frequency of micturition: Secondary | ICD-10-CM

## 2015-02-23 DIAGNOSIS — R6889 Other general symptoms and signs: Secondary | ICD-10-CM | POA: Diagnosis not present

## 2015-02-23 DIAGNOSIS — R7303 Prediabetes: Secondary | ICD-10-CM

## 2015-02-23 DIAGNOSIS — I10 Essential (primary) hypertension: Secondary | ICD-10-CM

## 2015-02-23 DIAGNOSIS — E039 Hypothyroidism, unspecified: Secondary | ICD-10-CM

## 2015-02-23 MED ORDER — MELOXICAM 15 MG PO TABS: 15.0000 mg | ORAL_TABLET | ORAL | Status: DC | PRN

## 2015-02-23 NOTE — Progress Notes (Signed)
MEDICARE WELLNESS AND ACUTE VISIT  Assessment:   1. HYPOTHYROIDISM - TSH  2. Pre-diabetes at goal  3. OSTEOPENIA Due 2 year for DEXA, cont vitamin D  4. HYPERLIPIDEMIA - CBC with Differential - Hepatic function panel - BASIC METABOLIC PANEL WITH GFR - Lipid panel  5. Allergy  6. Vitamin D deficiency - Vit D  25 hydroxy (rtn osteoporosis monitoring)  7. Encounter for long-term (current) use of other medications - Magnesium  8. Right flank/back pain No mid line back pain, negative straight leg, no rash Very mechanical, treat with mobic x 2 weeks, heating pad, rest, stretches.   9. Urinary frequency Check urine  10. History of breast cancer Continue MGM   Plan:   During the course of the visit the patient was educated and counseled about appropriate screening and preventive services including:    Pneumococcal vaccine   Influenza vaccine  Td vaccine  Screening electrocardiogram  Screening mammography  Bone densitometry screening  Colorectal cancer screening  Diabetes screening  Glaucoma screening  Nutrition counseling   Conditions/risks identified: BMI: Discussed weight loss, diet, and increase physical activity.  Increase physical activity: AHA recommends 150 minutes of physical activity a week.  Medications reviewed DEXA- due next year Diabetes is at goal Urinary Incontinence is not an issue: discussed non pharmacology and pharmacology options.  Fall risk: low- discussed PT, home fall assessment, medications.    Subjective:   Haley Shah is a 74 y.o. female who presents for Medicare Annual Wellness Visit and back pain.  Date of last medicare wellness visit was 2015  Her blood pressure has been controlled at home, today their BP is BP: 118/80 mmHg She does workout. She denies chest pain, shortness of breath, dizziness.  She is on cholesterol medication, lipitor 80 1/2 3 days a week. Her cholesterol is not at goal. The cholesterol last  visit was:   Lab Results  Component Value Date   CHOL 212* 10/03/2014   HDL 56 10/03/2014   LDLCALC 131* 10/03/2014   TRIG 125 10/03/2014   CHOLHDL 3.8 10/03/2014   She has history of right flank pain x 2-3 days. Worse with change in position, turning the wrong way. No injury/no falls. No radiation. Has been taking tylenol that helps, worse first thing in the AM. She has been having some urinary frequency, dark urine without burning, no fever or chills.   Last A1C in the office was:  Lab Results  Component Value Date   HGBA1C 5.6 10/03/2014  Has history of left breast cancer in 2009, gets regular visit.  Patient is on Vitamin D supplement. She is on thyroid medication. Her medication was changed last visit, she is on 1/2 pill Sat/Sun and whole rest of the week. Patient denies nervousness and palpitations.  Lab Results  Component Value Date   TSH 0.063* 01/05/2015    Names of Other Physician/Practitioners you currently use: 1.  [facility]- here for primary care 2. Dr. Katy Fitch, eye doctor, has appointment Jan 3.  Dr. Harmon Pier , dentist, last visit 6 months ago Patient Care Team: Unk Pinto, MD as PCP - General (Internal Medicine)  Medication Review Current Outpatient Prescriptions on File Prior to Visit  Medication Sig Dispense Refill  . aspirin 81 MG tablet Take 81 mg by mouth daily.    Marland Kitchen atorvastatin (LIPITOR) 80 MG tablet TAKE 1/2 to whole tablet DAILY OR AS DIRECTED FOR CHOLESTEROL 90 tablet 3  . latanoprost (XALATAN) 0.005 % ophthalmic solution Place  1 drop into both eyes at bedtime.    Marland Kitchen levothyroxine (SYNTHROID, LEVOTHROID) 100 MCG tablet TAKE ONE TABLET BY MOUTH ONCE DAILY 90 tablet 1  . meloxicam (MOBIC) 15 MG tablet Take 15 mg by mouth as needed for pain.    . Multiple Vitamins-Minerals (MULTIVITAMIN PO) Take by mouth daily.    Marland Kitchen OVER THE COUNTER MEDICATION OTC Fiber Gummies    . OVER THE COUNTER MEDICATION daily. VitaFusion  Women's Multivitamin    . VITAMIN D, CHOLECALCIFEROL, PO Take 2,000 Int'l Units by mouth.     . Wheat Dextrin (BENEFIBER) POWD Take 2 scoop by mouth daily.     No current facility-administered medications on file prior to visit.    Current Problems (verified) Patient Active Problem List   Diagnosis Date Noted  . Essential hypertension 10/27/2013  . Medication management 04/23/2013  . PreDiabetes   . Vitamin D deficiency   . Cancer of left breast ([place]) 11/03/2012  . Hypothyroidism 03/22/2008  . Hyperlipidemia 03/22/2008  . G E R D 03/22/2008  . IBS 03/22/2008  . Disorder of bone and cartilage 03/22/2008  . Vertigo 03/22/2008  . Thyroid Cancer,Hx 03/22/2008    Screening Tests Health Maintenance  Topic Date Due  . ZOSTAVAX  03/03/1999  . PNA vac Low Risk Adult (2 of 2 - PCV13) 03/18/2009  . TETANUS/TDAP  03/19/2015  . INFLUENZA VACCINE  10/17/2015  . COLONOSCOPY  03/19/2019  . DEXA SCAN  Completed     Immunization History  Administered Date(s) Administered  . Influenza, High Dose Seasonal PF 01/05/2013, 01/05/2015  . Pneumococcal Polysaccharide-23 03/18/2008  . Td 03/18/2005    Preventative care: Last colonoscopy: 2011 due 10 years Last mammogram: 12/2013, CAT B Last pap smear/pelvic exam: 2012 DEXA: 12/2014, ostoepenia Echo 2010  Prior vaccinations: TD or Tdap: 2007  Influenza: 2014 Pneumococcal: 2010 Prevnar 13: DUE but prefers not to get today Shingles/Zostavax: declines due to cost  Allergies Allergies  Allergen Reactions  . Neomycin-Bacitracin Zn-Polymyx     REACTION: rash  . Propoxyphene Hcl     REACTION: syncope  . Darvon Other (See Comments)    Almost fainted when taking, over 50 years.   Surgical history Past Surgical History  Procedure Laterality Date  . Thyroid surgery  1986    partial thyroidectomy  . Breast lumpectomy  2010    LEFT BREAST-snbx  . Colonoscopy    . Cardiac catheterization  2006    normal coronary arteries  . Knee  arthroscopy with medial menisectomy Left 05/25/2014    Procedure: LEFT ARTHROSCOPY KNEE medial and lateral meniscectomy,femoral patella chondroplasty;  Surgeon: Dorna Leitz, MD;  Location: [facility];  Service: Orthopedics;  Laterality: Left;  . Knee arthroscopy with lateral menisectomy Left 05/25/2014    Procedure: KNEE ARTHROSCOPY WITH LATERAL MENISECTOMY;  Surgeon: Dorna Leitz, MD;  Location: [facility];  Service: Orthopedics;  Laterality: Left;  . Chondroplasty Left 05/25/2014    Procedure: CHONDROPLASTY;  Surgeon: Dorna Leitz, MD;  Location: [facility];  Service: Orthopedics;  Laterality: Left;   Family history Family History  Problem Relation Age of Onset  . Colon cancer Father   . Stroke Father   . Cancer Father     Pancreatic    Tobacco Social History  Substance Use Topics  . Smoking status: Never Smoker   . Smokeless tobacco: Never Used  . Alcohol Use: No   She does not smoke.  Patient is not a former smoker. Are there  smokers in your home (other than you)?  No  Alcohol Current alcohol use: none  Caffeine Current caffeine use: coffee 1 /day  Exercise Exercise limitations: The patient has no exercise limitations. Current exercise: housecleaning and walking  Nutrition/Diet Current diet: in general, a "healthy" diet    Cardiac risk factors: advanced age (older than 25 for men, 11 for women), dyslipidemia, hypertension and sedentary lifestyle.  Depression Screen (Note: if answer to either of the following is "Yes", a more complete depression screening is indicated)   Q1: Over the past two weeks, have you felt down, depressed or hopeless? No  Q2: Over the past two weeks, have you felt little interest or pleasure in doing things? No  Have you lost interest or pleasure in daily life? No  Do you often feel hopeless? No  Do you cry easily over simple problems? No  Activities of Daily Living In your present state of health, do  you have any difficulty performing the following activities?:  Driving? No Managing money?  No Feeding yourself? No Getting from bed to chair? No Climbing a flight of stairs? No Preparing food and eating?: No Bathing or showering? No Getting dressed: No Getting to the toilet? No Using the toilet:No Moving around from place to place: No In the past year have you fallen or had a near fall?:No   Are you sexually active?  No  Do you have more than one partner?  No  Vision Difficulties: No  Hearing Difficulties: No Do you often ask people to speak up or repeat themselves? No Do you experience ringing or noises in your ears? No Do you have difficulty understanding soft or whispered voices? No  Cognition  Do you feel that you have a problem with memory?No  Do you often misplace items? No  Do you feel safe at home?  Yes  Advanced directives Does patient have a [place]? Yes Does patient have a Living Will? Yes   Objective:   Blood pressure 118/80, pulse 71, temperature 97.9 F (36.6 C), resp. rate 16, height 5\' 1"  (1.549 m), weight 124 lb (56.246 kg), SpO2 99 %. Body mass index is 23.44 kg/(m^2).  General appearance: alert, no distress, WD/WN,  female Cognitive Testing  Alert? Yes  Normal Appearance?Yes  Oriented to person? Yes  Place? Yes   Time? Yes  Recall of three objects?  Yes  Can perform simple calculations? Yes  Displays appropriate judgment?Yes  Can read the correct time from a watch face?Yes  HEENT: normocephalic, sclerae anicteric, TMs pearly, nares patent, no discharge or erythema, pharynx normal Oral cavity: MMM, no lesions Neck: supple, no lymphadenopathy, no thyromegaly, no masses Heart: RRR, normal S1, S2, no murmurs Lungs: CTA bilaterally, no wheezes, rhonchi, or rales Abdomen: +bs, soft, non tender, non distended, no masses, no hepatomegaly, no splenomegaly Musculoskeletal: nontender, no swelling, no obvious deformity. Patient is  able to ambulate well. Gait is not  Antalgic. Straight leg raising with dorsiflexion negative bilaterally for radicular symptoms. Sensory exam in the legs are normal. Knee reflexes are normal Ankle reflexes are normal Strength is normal and symmetric in arms and legs. There is not SI tenderness to palpation.  There isparaspinal muscle spasm.  There is not midline tenderness.  ROM of spine with  limited in all spheres due to pain.  Extremities: no edema, no cyanosis, no clubbing Pulses: 2+ symmetric, upper and lower extremities, normal cap refill Neurological: alert, oriented x 3, CN2-12 intact, strength normal upper extremities  and lower extremities, sensation normal throughout, DTRs 2+ throughout, no cerebellar signs, gait normal Psychiatric: normal affect, behavior normal, pleasant  Breast: defer Gyn: defer Rectal: defer  Medicare Attestation I have personally reviewed: The patient's medical and social history Their use of alcohol, tobacco or illicit drugs Their current medications and supplements The patient's functional ability including ADLs,fall risks, home safety risks, cognitive, and hearing and visual impairment Diet and physical activities Evidence for depression or mood disorders  The patient's weight, height, BMI, and visual acuity have been recorded in the chart.  I have made referrals, counseling, and provided education to the patient based on review of the above and I have provided the patient with a written personalized care plan for preventive services.     Vicie Mutters, PA-C   02/23/2015

## 2015-02-23 NOTE — Patient Instructions (Addendum)
GET PREVNAR 13  Low Back Sprain With Rehab A sprain is an injury in which a ligament is torn. The ligaments of the lower back are vulnerable to sprains. However, they are strong and require great force to be injured. These ligaments are important for stabilizing the spinal column. Sprains are classified into three categories. Grade 1 sprains cause pain, but the tendon is not lengthened. Grade 2 sprains include a lengthened ligament, due to the ligament being stretched or partially ruptured. With grade 2 sprains there is still function, although the function may be decreased. Grade 3 sprains involve a complete tear of the tendon or muscle, and function is usually impaired. SYMPTOMS   Severe pain in the lower back.  Sometimes, a feeling of a "pop," "snap," or tear, at the time of injury.  Tenderness and sometimes swelling at the injury site.  Uncommonly, bruising (contusion) within 48 hours of injury.  Muscle spasms in the back. CAUSES  Low back sprains occur when a force is placed on the ligaments that is greater than they can handle. Common causes of injury include:  Performing a stressful act while off-balance.  Repetitive stressful activities that involve movement of the lower back.  Direct hit (trauma) to the lower back. RISK INCREASES WITH:  Contact sports (football, wrestling).  Collisions (major skiing accidents).  Sports that require throwing or lifting (baseball, weightlifting).  Sports involving twisting of the spine (gymnastics, diving, tennis, golf).  Poor strength and flexibility.  Inadequate protection.  Previous back injury or surgery (especially fusion). PREVENTION  Wear properly fitted and padded protective equipment.  Warm up and stretch properly before activity.  Allow for adequate recovery between workouts.  Maintain physical fitness:  Strength, flexibility, and endurance.  Cardiovascular fitness.  Maintain a healthy body weight. PROGNOSIS  If  treated properly, low back sprains usually heal with non-surgical treatment. The length of time for healing depends on the severity of the injury.  RELATED COMPLICATIONS   Recurring symptoms, resulting in a chronic problem.  Chronic inflammation and pain in the low back.  Delayed healing or resolution of symptoms, especially if activity is resumed too soon.  Prolonged impairment.  Unstable or arthritic joints of the low back. TREATMENT  Treatment first involves the use of ice and medicine, to reduce pain and inflammation. The use of strengthening and stretching exercises may help reduce pain with activity. These exercises may be performed at home or with a therapist. Severe injuries may require referral to a therapist for further evaluation and treatment, such as ultrasound. Your caregiver may advise that you wear a back brace or corset, to help reduce pain and discomfort. Often, prolonged bed rest results in greater harm then benefit. Corticosteroid injections may be recommended. However, these should be reserved for the most serious cases. It is important to avoid using your back when lifting objects. At night, sleep on your back on a firm mattress, with a pillow placed under your knees. If non-surgical treatment is unsuccessful, surgery may be needed.  MEDICATION   If pain medicine is needed, nonsteroidal anti-inflammatory medicines (aspirin and ibuprofen), or other minor pain relievers (acetaminophen), are often advised.  Do not take pain medicine for 7 days before surgery.  Prescription pain relievers may be given, if your caregiver thinks they are needed. Use only as directed and only as much as you need.  Ointments applied to the skin may be helpful.  Corticosteroid injections may be given by your caregiver. These injections should be reserved for the  most serious cases, because they may only be given a certain number of times. HEAT AND COLD  Cold treatment (icing) should be applied  for 10 to 15 minutes every 2 to 3 hours for inflammation and pain, and immediately after activity that aggravates your symptoms. Use ice packs or an ice massage.  Heat treatment may be used before performing stretching and strengthening activities prescribed by your caregiver, physical therapist, or athletic trainer. Use a heat pack or a warm water soak. SEEK MEDICAL CARE IF:   Symptoms get worse or do not improve in 2 to 4 weeks, despite treatment.  You develop numbness or weakness in either leg.  You lose bowel or bladder function.  Any of the following occur after surgery: fever, increased pain, swelling, redness, drainage of fluids, or bleeding in the affected area.  New, unexplained symptoms develop. (Drugs used in treatment may produce side effects.) EXERCISES  RANGE OF MOTION (ROM) AND STRETCHING EXERCISES - Low Back Sprain Most people with lower back pain will find that their symptoms get worse with excessive bending forward (flexion) or arching at the lower back (extension). The exercises that will help resolve your symptoms will focus on the opposite motion.  Your physician, physical therapist or athletic trainer will help you determine which exercises will be most helpful to resolve your lower back pain. Do not complete any exercises without first consulting with your caregiver. Discontinue any exercises which make your symptoms worse, until you speak to your caregiver. If you have pain, numbness or tingling which travels down into your buttocks, leg or foot, the goal of the therapy is for these symptoms to move closer to your back and eventually resolve. Sometimes, these leg symptoms will get better, but your lower back pain may worsen. This is often an indication of progress in your rehabilitation. Be very alert to any changes in your symptoms and the activities in which you participated in the 24 hours prior to the change. Sharing this information with your caregiver will allow him or  her to most efficiently treat your condition. These exercises may help you when beginning to rehabilitate your injury. Your symptoms may resolve with or without further involvement from your physician, physical therapist or athletic trainer. While completing these exercises, remember:   Restoring tissue flexibility helps normal motion to return to the joints. This allows healthier, less painful movement and activity.  An effective stretch should be held for at least 30 seconds.  A stretch should never be painful. You should only feel a gentle lengthening or release in the stretched tissue. FLEXION RANGE OF MOTION AND STRETCHING EXERCISES: STRETCH - Flexion, Single Knee to Chest   Lie on a firm bed or floor with both legs extended in front of you.  Keeping one leg in contact with the floor, bring your opposite knee to your chest. Hold your leg in place by either grabbing behind your thigh or at your knee.  Pull until you feel a gentle stretch in your low back. Hold __________ seconds.  Slowly release your grasp and repeat the exercise with the opposite side. Repeat __________ times. Complete this exercise __________ times per day.  STRETCH - Flexion, Double Knee to Chest  Lie on a firm bed or floor with both legs extended in front of you.  Keeping one leg in contact with the floor, bring your opposite knee to your chest.  Tense your stomach muscles to support your back and then lift your other knee to your chest.  Hold your legs in place by either grabbing behind your thighs or at your knees.  Pull both knees toward your chest until you feel a gentle stretch in your low back. Hold __________ seconds.  Tense your stomach muscles and slowly return one leg at a time to the floor. Repeat __________ times. Complete this exercise __________ times per day.  STRETCH - Low Trunk Rotation  Lie on a firm bed or floor. Keeping your legs in front of you, bend your knees so they are both pointed  toward the ceiling and your feet are flat on the floor.  Extend your arms out to the side. This will stabilize your upper body by keeping your shoulders in contact with the floor.  Gently and slowly drop both knees together to one side until you feel a gentle stretch in your low back. Hold for __________ seconds.  Tense your stomach muscles to support your lower back as you bring your knees back to the starting position. Repeat the exercise to the other side. Repeat __________ times. Complete this exercise __________ times per day  EXTENSION RANGE OF MOTION AND FLEXIBILITY EXERCISES: STRETCH - Extension, Prone on Elbows   Lie on your stomach on the floor, a bed will be too soft. Place your palms about shoulder width apart and at the height of your head.  Place your elbows under your shoulders. If this is too painful, stack pillows under your chest.  Allow your body to relax so that your hips drop lower and make contact more completely with the floor.  Hold this position for __________ seconds.  Slowly return to lying flat on the floor. Repeat __________ times. Complete this exercise __________ times per day.  RANGE OF MOTION - Extension, Prone Press Ups  Lie on your stomach on the floor, a bed will be too soft. Place your palms about shoulder width apart and at the height of your head.  Keeping your back as relaxed as possible, slowly straighten your elbows while keeping your hips on the floor. You may adjust the placement of your hands to maximize your comfort. As you gain motion, your hands will come more underneath your shoulders.  Hold this position __________ seconds.  Slowly return to lying flat on the floor. Repeat __________ times. Complete this exercise __________ times per day.  RANGE OF MOTION- Quadruped, Neutral Spine   Assume a hands and knees position on a firm surface. Keep your hands under your shoulders and your knees under your hips. You may place padding under your  knees for comfort.  Drop your head and point your tailbone toward the ground below you. This will round out your lower back like an angry cat. Hold this position for __________ seconds.  Slowly lift your head and release your tail bone so that your back sags into a large arch, like an old horse.  Hold this position for __________ seconds.  Repeat this until you feel limber in your low back.  Now, find your "sweet spot." This will be the most comfortable position somewhere between the two previous positions. This is your neutral spine. Once you have found this position, tense your stomach muscles to support your low back.  Hold this position for __________ seconds. Repeat __________ times. Complete this exercise __________ times per day.  STRENGTHENING EXERCISES - Low Back Sprain These exercises may help you when beginning to rehabilitate your injury. These exercises should be done near your "sweet spot." This is the neutral, low-back arch, somewhere  between fully rounded and fully arched, that is your least painful position. When performed in this safe range of motion, these exercises can be used for people who have either a flexion or extension based injury. These exercises may resolve your symptoms with or without further involvement from your physician, physical therapist or athletic trainer. While completing these exercises, remember:   Muscles can gain both the endurance and the strength needed for everyday activities through controlled exercises.  Complete these exercises as instructed by your physician, physical therapist or athletic trainer. Increase the resistance and repetitions only as guided.  You may experience muscle soreness or fatigue, but the pain or discomfort you are trying to eliminate should never worsen during these exercises. If this pain does worsen, stop and make certain you are following the directions exactly. If the pain is still present after adjustments, discontinue  the exercise until you can discuss the trouble with your caregiver. STRENGTHENING - Deep Abdominals, Pelvic Tilt   Lie on a firm bed or floor. Keeping your legs in front of you, bend your knees so they are both pointed toward the ceiling and your feet are flat on the floor.  Tense your lower abdominal muscles to press your low back into the floor. This motion will rotate your pelvis so that your tail bone is scooping upwards rather than pointing at your feet or into the floor. With a gentle tension and even breathing, hold this position for __________ seconds. Repeat __________ times. Complete this exercise __________ times per day.  STRENGTHENING - Abdominals, Crunches   Lie on a firm bed or floor. Keeping your legs in front of you, bend your knees so they are both pointed toward the ceiling and your feet are flat on the floor. Cross your arms over your chest.  Slightly tip your chin down without bending your neck.  Tense your abdominals and slowly lift your trunk high enough to just clear your shoulder blades. Lifting higher can put excessive stress on the lower back and does not further strengthen your abdominal muscles.  Control your return to the starting position. Repeat __________ times. Complete this exercise __________ times per day.  STRENGTHENING - Quadruped, Opposite UE/LE Lift   Assume a hands and knees position on a firm surface. Keep your hands under your shoulders and your knees under your hips. You may place padding under your knees for comfort.  Find your neutral spine and gently tense your abdominal muscles so that you can maintain this position. Your shoulders and hips should form a rectangle that is parallel with the floor and is not twisted.  Keeping your trunk steady, lift your right hand no higher than your shoulder and then your left leg no higher than your hip. Make sure you are not holding your breath. Hold this position for __________ seconds.  Continuing to keep  your abdominal muscles tense and your back steady, slowly return to your starting position. Repeat with the opposite arm and leg. Repeat __________ times. Complete this exercise __________ times per day.  STRENGTHENING - Abdominals and Quadriceps, Straight Leg Raise   Lie on a firm bed or floor with both legs extended in front of you.  Keeping one leg in contact with the floor, bend the other knee so that your foot can rest flat on the floor.  Find your neutral spine, and tense your abdominal muscles to maintain your spinal position throughout the exercise.  Slowly lift your straight leg off the floor about 6 inches  for a count of 15, making sure to not hold your breath.  Still keeping your neutral spine, slowly lower your leg all the way to the floor. Repeat this exercise with each leg __________ times. Complete this exercise __________ times per day. POSTURE AND BODY MECHANICS CONSIDERATIONS - Low Back Sprain Keeping correct posture when sitting, standing or completing your activities will reduce the stress put on different body tissues, allowing injured tissues a chance to heal and limiting painful experiences. The following are general guidelines for improved posture. Your physician or physical therapist will provide you with any instructions specific to your needs. While reading these guidelines, remember:  The exercises prescribed by your provider will help you have the flexibility and strength to maintain correct postures.  The correct posture provides the best environment for your joints to work. All of your joints have less wear and tear when properly supported by a spine with good posture. This means you will experience a healthier, less painful body.  Correct posture must be practiced with all of your activities, especially prolonged sitting and standing. Correct posture is as important when doing repetitive low-stress activities (typing) as it is when doing a single heavy-load  activity (lifting). RESTING POSITIONS Consider which positions are most painful for you when choosing a resting position. If you have pain with flexion-based activities (sitting, bending, stooping, squatting), choose a position that allows you to rest in a less flexed posture. You would want to avoid curling into a fetal position on your side. If your pain worsens with extension-based activities (prolonged standing, working overhead), avoid resting in an extended position such as sleeping on your stomach. Most people will find more comfort when they rest with their spine in a more neutral position, neither too rounded nor too arched. Lying on a non-sagging bed on your side with a pillow between your knees, or on your back with a pillow under your knees will often provide some relief. Keep in mind, being in any one position for a prolonged period of time, no matter how correct your posture, can still lead to stiffness. PROPER SITTING POSTURE In order to minimize stress and discomfort on your spine, you must sit with correct posture. Sitting with good posture should be effortless for a healthy body. Returning to good posture is a gradual process. Many people can work toward this most comfortably by using various supports until they have the flexibility and strength to maintain this posture on their own. When sitting with proper posture, your ears will fall over your shoulders and your shoulders will fall over your hips. You should use the back of the chair to support your upper back. Your lower back will be in a neutral position, just slightly arched. You may place a small pillow or folded towel at the base of your lower back for  support.  When working at a desk, create an environment that supports good, upright posture. Without extra support, muscles tire, which leads to excessive strain on joints and other tissues. Keep these recommendations in mind: CHAIR:  A chair should be able to slide under your desk  when your back makes contact with the back of the chair. This allows you to work closely.  The chair's height should allow your eyes to be level with the upper part of your monitor and your hands to be slightly lower than your elbows. BODY POSITION  Your feet should make contact with the floor. If this is not possible, use a foot rest.  Keep your ears over your shoulders. This will reduce stress on your neck and low back. INCORRECT SITTING POSTURES  If you are feeling tired and unable to assume a healthy sitting posture, do not slouch or slump. This puts excessive strain on your back tissues, causing more damage and pain. Healthier options include:  Using more support, like a lumbar pillow.  Switching tasks to something that requires you to be upright or walking.  Talking a brief walk.  Lying down to rest in a neutral-spine position. PROLONGED STANDING WHILE SLIGHTLY LEANING FORWARD  When completing a task that requires you to lean forward while standing in one place for a long time, place either foot up on a stationary 2-4 inch high object to help maintain the best posture. When both feet are on the ground, the lower back tends to lose its slight inward curve. If this curve flattens (or becomes too large), then the back and your other joints will experience too much stress, tire more quickly, and can cause pain. CORRECT STANDING POSTURES Proper standing posture should be assumed with all daily activities, even if they only take a few moments, like when brushing your teeth. As in sitting, your ears should fall over your shoulders and your shoulders should fall over your hips. You should keep a slight tension in your abdominal muscles to brace your spine. Your tailbone should point down to the ground, not behind your body, resulting in an over-extended swayback posture.  INCORRECT STANDING POSTURES  Common incorrect standing postures include a forward head, locked knees and/or an excessive  swayback. WALKING Walk with an upright posture. Your ears, shoulders and hips should all line-up. PROLONGED ACTIVITY IN A FLEXED POSITION When completing a task that requires you to bend forward at your waist or lean over a low surface, try to find a way to stabilize 3 out of 4 of your limbs. You can place a hand or elbow on your thigh or rest a knee on the surface you are reaching across. This will provide you more stability, so that your muscles do not tire as quickly. By keeping your knees relaxed, or slightly bent, you will also reduce stress across your lower back. CORRECT LIFTING TECHNIQUES DO :  Assume a wide stance. This will provide you more stability and the opportunity to get as close as possible to the object which you are lifting.  Tense your abdominals to brace your spine. Bend at the knees and hips. Keeping your back locked in a neutral-spine position, lift using your leg muscles. Lift with your legs, keeping your back straight.  Test the weight of unknown objects before attempting to lift them.  Try to keep your elbows locked down at your sides in order get the best strength from your shoulders when carrying an object.  Always ask for help when lifting heavy or awkward objects. INCORRECT LIFTING TECHNIQUES DO NOT:   Lock your knees when lifting, even if it is a small object.  Bend and twist. Pivot at your feet or move your feet when needing to change directions.  Assume that you can safely pick up even a paperclip without proper posture.   This information is not intended to replace advice given to you by your health care provider. Make sure you discuss any questions you have with your health care provider.   Document Released: 03/04/2005 Document Revised: 03/25/2014 Document Reviewed: 06/16/2008 Elsevier Interactive Patient Education [employer].

## 2015-02-24 LAB — CBC WITH DIFFERENTIAL/PLATELET
BASOS PCT: 1 % (ref 0–1)
Basophils Absolute: 0.1 10*3/uL (ref 0.0–0.1)
EOS PCT: 1 % (ref 0–5)
Eosinophils Absolute: 0.1 10*3/uL (ref 0.0–0.7)
HCT: 38.9 % (ref 36.0–46.0)
HEMOGLOBIN: 12.7 g/dL (ref 12.0–15.0)
Lymphocytes Relative: 24 % (ref 12–46)
Lymphs Abs: 2 10*3/uL (ref 0.7–4.0)
MCH: 29.5 pg (ref 26.0–34.0)
MCHC: 32.6 g/dL (ref 30.0–36.0)
MCV: 90.3 fL (ref 78.0–100.0)
MONO ABS: 0.7 10*3/uL (ref 0.1–1.0)
MONOS PCT: 8 % (ref 3–12)
MPV: 9.2 fL (ref 8.6–12.4)
NEUTROS ABS: 5.4 10*3/uL (ref 1.7–7.7)
Neutrophils Relative %: 66 % (ref 43–77)
Platelets: 262 10*3/uL (ref 150–400)
RBC: 4.31 MIL/uL (ref 3.87–5.11)
RDW: 14.2 % (ref 11.5–15.5)
WBC: 8.2 10*3/uL (ref 4.0–10.5)

## 2015-02-24 LAB — URINALYSIS, ROUTINE W REFLEX MICROSCOPIC
Bilirubin Urine: NEGATIVE
GLUCOSE, UA: NEGATIVE
Ketones, ur: NEGATIVE
LEUKOCYTES UA: NEGATIVE
NITRITE: NEGATIVE
PROTEIN: NEGATIVE
Specific Gravity, Urine: 1.008 (ref 1.001–1.035)
pH: 5.5 (ref 5.0–8.0)

## 2015-02-24 LAB — BASIC METABOLIC PANEL WITH GFR
BUN: 11 mg/dL (ref 7–25)
CALCIUM: 9.5 mg/dL (ref 8.6–10.4)
CO2: 25 mmol/L (ref 20–31)
CREATININE: 0.81 mg/dL (ref 0.60–0.93)
Chloride: 102 mmol/L (ref 98–110)
GFR, Est African American: 82 mL/min (ref >=60)
GFR, Est Non African American: 71 mL/min (ref >=60)
GLUCOSE: 137 mg/dL — AB (ref 65–99)
Potassium: 4.4 mmol/L (ref 3.5–5.3)
SODIUM: 142 mmol/L (ref 135–146)

## 2015-02-24 LAB — MICROALBUMIN / CREATININE URINE RATIO: CREATININE, URINE: 30 mg/dL (ref 20–320)

## 2015-02-24 LAB — URINALYSIS, MICROSCOPIC ONLY
Bacteria, UA: NONE SEEN [HPF]
CASTS: NONE SEEN [LPF]
CRYSTALS: NONE SEEN [HPF]
RBC / HPF: NONE SEEN RBC/HPF (ref <=2)
Squamous Epithelial / LPF: NONE SEEN [HPF] (ref <=5)
Yeast: NONE SEEN [HPF]

## 2015-02-24 LAB — TSH: TSH: 0.361 u[IU]/mL (ref 0.350–4.500)

## 2015-03-01 ENCOUNTER — Other Ambulatory Visit: Payer: Self-pay | Admitting: Internal Medicine

## 2015-03-02 ENCOUNTER — Ambulatory Visit (INDEPENDENT_AMBULATORY_CARE_PROVIDER_SITE_OTHER): Payer: Medicare HMO

## 2015-03-02 DIAGNOSIS — Z23 Encounter for immunization: Secondary | ICD-10-CM | POA: Diagnosis not present

## 2015-03-13 ENCOUNTER — Encounter: Payer: Self-pay | Admitting: *Deleted

## 2015-04-26 ENCOUNTER — Ambulatory Visit (INDEPENDENT_AMBULATORY_CARE_PROVIDER_SITE_OTHER): Payer: PPO | Admitting: Internal Medicine

## 2015-04-26 ENCOUNTER — Encounter: Payer: Self-pay | Admitting: Internal Medicine

## 2015-04-26 VITALS — BP 112/62 | HR 74 | Temp 98.0°F | Resp 16 | Ht 61.0 in | Wt 122.0 lb

## 2015-04-26 DIAGNOSIS — J069 Acute upper respiratory infection, unspecified: Secondary | ICD-10-CM

## 2015-04-26 MED ORDER — PREDNISONE 20 MG PO TABS: ORAL_TABLET | ORAL | Status: DC

## 2015-04-26 MED ORDER — AZITHROMYCIN 250 MG PO TABS: ORAL_TABLET | ORAL | Status: DC

## 2015-04-26 NOTE — Patient Instructions (Signed)
Please use flonase 2 sprays per nostril.  Please take allegra daily.  Please use saline as often as you can tolerate.    Please take prednisone until it is gone.    Please use the zpak only if you get fevers, colored sputum, or  Any other concerning symptoms start.

## 2015-04-26 NOTE — Progress Notes (Signed)
Patient ID: Haley Shah, female   DOB: 1941-01-29, 75 y.o.   MRN: [id number]  HPI  Patient presents to the office for evaluation of cough.  It has been going on for 1 weeks.  Patient reports night > day, wet, worse with lying down.  They also endorse change in voice, postnasal drip and nasal congestion, sinus pressure, clear rhinorrhea, ear congestion.  .  They have tried antihistamines or mucinex.  They report that nothing has worked.  They admits to other sick contacts.  She was at a birthday party which had lots of sick people.  She had one recorded fever of 101.6 F.   Review of Systems  Constitutional: Positive for fever. Negative for chills and malaise/fatigue.  HENT: Positive for congestion. Negative for ear pain and sore throat.   Respiratory: Positive for cough. Negative for shortness of breath and wheezing.   Cardiovascular: Negative for chest pain, palpitations and leg swelling.  Neurological: Negative for headaches.    PE:  Filed Vitals:   04/26/15 0927  BP: 112/62  Pulse: 74  Temp: 98 F (36.7 C)  Resp: 16   General:  Alert and non-toxic, WDWN, NAD HEENT: NCAT, PERLA, EOM normal, no occular discharge or erythema.  Nasal mucosal edema with sinus tenderness to palpation.  Oropharynx clear with minimal oropharyngeal edema and erythema.  Mucous membranes moist and pink. Neck:  Cervical adenopathy Chest:  RRR no MRGs.  Lungs clear to auscultation A&P with no wheezes rhonchi or rales.   Abdomen: +BS x 4 quadrants, soft, non-tender, no guarding, rigidity, or rebound. Skin: warm and dry no rash Neuro: A&Ox4, CN II-XII grossly intact  Assessment and Plan:   1. Acute URI -prednisone -nasal saline -flonase -mucinex -tessalon -zpak only if no relief.

## 2015-04-28 ENCOUNTER — Encounter: Payer: Self-pay | Admitting: Internal Medicine

## 2015-05-10 ENCOUNTER — Encounter: Payer: Self-pay | Admitting: Internal Medicine

## 2015-05-11 ENCOUNTER — Encounter: Payer: Self-pay | Admitting: Internal Medicine

## 2015-05-11 ENCOUNTER — Ambulatory Visit (INDEPENDENT_AMBULATORY_CARE_PROVIDER_SITE_OTHER): Payer: PPO | Admitting: Internal Medicine

## 2015-05-11 VITALS — BP 122/76 | HR 64 | Temp 97.8°F | Resp 16 | Ht 61.0 in | Wt 120.0 lb

## 2015-05-11 DIAGNOSIS — H02831 Dermatochalasis of right upper eyelid: Secondary | ICD-10-CM | POA: Diagnosis not present

## 2015-05-11 DIAGNOSIS — H02834 Dermatochalasis of left upper eyelid: Secondary | ICD-10-CM | POA: Diagnosis not present

## 2015-05-11 DIAGNOSIS — J069 Acute upper respiratory infection, unspecified: Secondary | ICD-10-CM

## 2015-05-11 DIAGNOSIS — H401111 Primary open-angle glaucoma, right eye, mild stage: Secondary | ICD-10-CM | POA: Diagnosis not present

## 2015-05-11 DIAGNOSIS — H2513 Age-related nuclear cataract, bilateral: Secondary | ICD-10-CM | POA: Diagnosis not present

## 2015-05-11 DIAGNOSIS — H401122 Primary open-angle glaucoma, left eye, moderate stage: Secondary | ICD-10-CM | POA: Diagnosis not present

## 2015-05-11 DIAGNOSIS — H04123 Dry eye syndrome of bilateral lacrimal glands: Secondary | ICD-10-CM | POA: Diagnosis not present

## 2015-05-11 MED ORDER — PHENYLEPH-PROMETHAZINE-COD 5-6.25-10 MG/5ML PO SYRP: ORAL_SOLUTION | ORAL | Status: DC

## 2015-05-11 MED ORDER — FLUTICASONE FUROATE-VILANTEROL 200-25 MCG/INH IN AEPB: 1.0000 | INHALATION_SPRAY | Freq: Every day | RESPIRATORY_TRACT | Status: DC

## 2015-05-11 NOTE — Progress Notes (Signed)
Patient ID: Haley Shah, female   DOB: Apr 20, 1940, 75 y.o.   MRN: [id number]  HPI  Patient presents to the office for evaluation of cough that is productive.  It has been going on for 3 weeks.  Patient reports day > night, dry, not related to position.  They also endorse change in voice and some mild post nasal drip.  .  They have tried antitussives, antibiotics or antihistamines.  They report that nothing has worked.  They admits to other sick contacts.  She has already finished her zpak, prednisone, and has also been doing nasacort.     Review of Systems  Constitutional: Negative for fever, chills and malaise/fatigue.  HENT: Negative for congestion, ear pain and sore throat.   Eyes: Negative.   Skin: Negative.   Neurological: Negative for headaches.    PE:  General:  Alert and non-toxic, WDWN, NAD HEENT: NCAT, PERLA, EOM normal, no occular discharge or erythema.  Nasal mucosal edema with sinus tenderness to palpation.  Oropharynx clear with minimal oropharyngeal edema and erythema.  Mucous membranes moist and pink. Neck:  Cervical adenopathy Chest:  RRR no MRGs.  Lungs clear to auscultation A&P with no wheezes rhonchi or rales.   Abdomen: +BS x 4 quadrants, soft, non-tender, no guarding, rigidity, or rebound. Skin: warm and dry no rash Neuro: A&Ox4, CN II-XII grossly intact  Assessment and Plan:   1. Acute URI -breo sample -no further abx -phenergan codeine

## 2015-05-22 DIAGNOSIS — H2512 Age-related nuclear cataract, left eye: Secondary | ICD-10-CM | POA: Diagnosis not present

## 2015-06-12 ENCOUNTER — Other Ambulatory Visit: Payer: Self-pay | Admitting: Internal Medicine

## 2015-06-26 ENCOUNTER — Encounter: Payer: Self-pay | Admitting: Internal Medicine

## 2015-07-05 ENCOUNTER — Encounter: Payer: Self-pay | Admitting: Physician Assistant

## 2015-07-19 ENCOUNTER — Ambulatory Visit (INDEPENDENT_AMBULATORY_CARE_PROVIDER_SITE_OTHER): Payer: PPO | Admitting: Internal Medicine

## 2015-07-19 ENCOUNTER — Encounter: Payer: Self-pay | Admitting: Internal Medicine

## 2015-07-19 VITALS — BP 134/72 | HR 60 | Temp 97.0°F | Resp 16 | Ht 61.5 in | Wt 121.0 lb

## 2015-07-19 DIAGNOSIS — I10 Essential (primary) hypertension: Secondary | ICD-10-CM | POA: Diagnosis not present

## 2015-07-19 DIAGNOSIS — E559 Vitamin D deficiency, unspecified: Secondary | ICD-10-CM | POA: Diagnosis not present

## 2015-07-19 DIAGNOSIS — E782 Mixed hyperlipidemia: Secondary | ICD-10-CM

## 2015-07-19 DIAGNOSIS — Z85850 Personal history of malignant neoplasm of thyroid: Secondary | ICD-10-CM | POA: Diagnosis not present

## 2015-07-19 DIAGNOSIS — R7303 Prediabetes: Secondary | ICD-10-CM

## 2015-07-19 DIAGNOSIS — K219 Gastro-esophageal reflux disease without esophagitis: Secondary | ICD-10-CM

## 2015-07-19 DIAGNOSIS — Z853 Personal history of malignant neoplasm of breast: Secondary | ICD-10-CM

## 2015-07-19 DIAGNOSIS — R7309 Other abnormal glucose: Secondary | ICD-10-CM | POA: Diagnosis not present

## 2015-07-19 DIAGNOSIS — E039 Hypothyroidism, unspecified: Secondary | ICD-10-CM | POA: Diagnosis not present

## 2015-07-19 DIAGNOSIS — Z136 Encounter for screening for cardiovascular disorders: Secondary | ICD-10-CM | POA: Diagnosis not present

## 2015-07-19 DIAGNOSIS — Z79899 Other long term (current) drug therapy: Secondary | ICD-10-CM

## 2015-07-19 DIAGNOSIS — Z1212 Encounter for screening for malignant neoplasm of rectum: Secondary | ICD-10-CM

## 2015-07-19 LAB — BASIC METABOLIC PANEL WITH GFR
BUN: 13 mg/dL (ref 7–25)
CHLORIDE: 103 mmol/L (ref 98–110)
CO2: 24 mmol/L (ref 20–31)
Calcium: 9.7 mg/dL (ref 8.6–10.4)
Creat: 0.81 mg/dL (ref 0.60–0.93)
GFR, EST AFRICAN AMERICAN: 82 mL/min (ref >=60)
GFR, EST NON AFRICAN AMERICAN: 71 mL/min (ref >=60)
Glucose, Bld: 87 mg/dL (ref 65–99)
POTASSIUM: 4.6 mmol/L (ref 3.5–5.3)
Sodium: 138 mmol/L (ref 135–146)

## 2015-07-19 LAB — CBC WITH DIFFERENTIAL/PLATELET
BASOS PCT: 1 %
Basophils Absolute: 64 cells/uL (ref 0–200)
EOS ABS: 128 {cells}/uL (ref 15–500)
Eosinophils Relative: 2 %
HEMATOCRIT: 40 % (ref 35.0–45.0)
Hemoglobin: 13 g/dL (ref 11.7–15.5)
Lymphocytes Relative: 29 %
Lymphs Abs: 1856 cells/uL (ref 850–3900)
MCH: 28.8 pg (ref 27.0–33.0)
MCHC: 32.5 g/dL (ref 32.0–36.0)
MCV: 88.7 fL (ref 80.0–100.0)
MONO ABS: 768 {cells}/uL (ref 200–950)
MPV: 9.3 fL (ref 7.5–12.5)
Monocytes Relative: 12 %
NEUTROS ABS: 3584 {cells}/uL (ref 1500–7800)
Neutrophils Relative %: 56 %
Platelets: 247 10*3/uL (ref 140–400)
RBC: 4.51 MIL/uL (ref 3.80–5.10)
RDW: 14.6 % (ref 11.0–15.0)
WBC: 6.4 10*3/uL (ref 3.8–10.8)

## 2015-07-19 LAB — HEMOGLOBIN A1C
Hgb A1c MFr Bld: 5.3 % (ref <5.7)
Mean Plasma Glucose: 105 mg/dL

## 2015-07-19 LAB — HEPATIC FUNCTION PANEL
ALBUMIN: 4.4 g/dL (ref 3.6–5.1)
ALK PHOS: 74 U/L (ref 33–130)
ALT: 8 U/L (ref 6–29)
AST: 16 U/L (ref 10–35)
BILIRUBIN DIRECT: 0.1 mg/dL (ref <=0.2)
BILIRUBIN INDIRECT: 0.4 mg/dL (ref 0.2–1.2)
BILIRUBIN TOTAL: 0.5 mg/dL (ref 0.2–1.2)
Total Protein: 6.7 g/dL (ref 6.1–8.1)

## 2015-07-19 LAB — LIPID PANEL
CHOL/HDL RATIO: 2.9 ratio (ref <=5.0)
Cholesterol: 170 mg/dL (ref 125–200)
HDL: 59 mg/dL (ref >=46)
LDL CALC: 94 mg/dL (ref <130)
TRIGLYCERIDES: 86 mg/dL (ref <150)
VLDL: 17 mg/dL (ref <30)

## 2015-07-19 LAB — TSH: TSH: 0.17 mIU/L — ABNORMAL LOW

## 2015-07-19 LAB — MAGNESIUM: Magnesium: 2.1 mg/dL (ref 1.5–2.5)

## 2015-07-19 NOTE — Progress Notes (Signed)
Patient ID: Haley Shah, female   DOB: [birth year], 75 y.o.   MRN: [id number]  [facility]OL[facility]                    Unk Pinto, M.D.    Uvaldo Bristle. Silverio Lay, P.A.-C      Starlyn Skeans, P.A.-C   [facility]                [address] [place], [place] [id number] Telephone [phone] Telefax [phone] _________________________________  Annual Screening/Preventative Visit And Comprehensive Evaluation &  Examination     This very nice 75 y.o. MWF presents for a Wellness/Preventative Visit & comprehensive evaluation and management of multiple medical co-morbidities.  Patient has been followed for HTN, Prediabetes, Hyperlipidemia and Vitamin D Deficiency. Patient had a partial thyroidectomy for cancer in 1988 and has been on suppressive replacement since. In 2010, patient lad a L Breast Lumpectomy for Ca and was treated with PO radiation and also Arimidex for 5 years.       Patient has been followed expectantly for labile HTN predating since Oct 2013. Patient's BP has been controlled at home and patient denies any cardiac symptoms as chest pain, palpitations, shortness of breath, dizziness or ankle swelling. Today's BP: 134/72 mmHg      Patient's hyperlipidemia is controlled with diet and medications. Patient denies myalgias or other medication SE's. Last lipids were not at goal with Cholesterol 212; HDL 56; LDL 131; Triglycerides 125 on 10/03/2014.     Patient has been screened & followed proactively for prediabetes and patient denies reactive hypoglycemic symptoms, visual blurring, diabetic polys, or paresthesias. Last A1c was 5.6% on 10/03/2014.     Finally, patient has history of Vitamin D Deficiency and last Vitamin D was 70 on 10/03/2014.  Medication Sig  . aspirin 81 MG tablet Take 81 mg by mouth daily.  Marland Kitchen atorvastatin80 MG tablet TAKE 1/2 to whole tablet DAILY OR AS DIRECTED FOR CHOLESTEROL  . XALATAN   ophth soln Place 1 drop into both eyes at bedtime.  Marland Kitchen levothyroxine  100 MCG tablet TAKE ONE TABLET BY MOUTH ONCE DAILY  . meloxicam (MOBIC) 15 MG tablet Take 1 tablet (15 mg total) by mouth as needed for pain.  . Multiple Vitamins-Minerals  Take by mouth daily.  .  Fiber Gummies OTC  . VitaFusion Women's Multivitamin daily.   Marland Kitchen VITAMIN D Take 2,000 Int'l Units by mouth.   . BENEFIBER Take 2 scoop by mouth daily.   Allergies  Allergen Reactions  . Neomycin-Bacitracin Zn-Polymyx     REACTION: rash  . Propoxyphene Hcl     REACTION: syncope  . Darvon Other (See Comments)    Almost fainted when taking, over 50 years.   Past Medical History  Diagnosis Date  . GERD (gastroesophageal reflux disease)   . History of thyroid cancer   . IBS (irritable bowel syndrome)   . Sciatica     RLE  . Hyperlipidemia   . Cancer [facility]) 2010    Left Breast  . Vitamin D deficiency   . Pre-diabetes   . Allergy   . Thyroid disease     Hypo  . Glaucoma   . Hypertension     no meds  . Dyspnea     chronic-no cardiac reason  . Wears glasses    Health Maintenance  Topic Date  Due  . ZOSTAVAX  03/03/1999  . TETANUS/TDAP  03/19/2015  . INFLUENZA VACCINE  10/17/2015  . DEXA SCAN  Completed  . PNA vac Low Risk Adult  Completed   Immunization History  Administered Date(s) Administered  . Influenza, High Dose Seasonal PF 01/05/2013, 01/05/2015  . Pneumococcal Conjugate-13 03/02/2015  . Pneumococcal Polysaccharide-23 03/18/2008  . Td 03/18/2005   Past Surgical History  Procedure Laterality Date  . Thyroid surgery  -  partial thyroidectomy  1986  . Breast lumpectomy  2010    LEFT BREAST-snbx  . Colonoscopy    . Cardiac catheterization  - normal coronary arteries  2006  . Knee arthroscopy with medial menisectomy Left 05/25/2014    Procedure: LEFT ARTHROSCOPY KNEE medial and lateral meniscectomy,femoral patella chondroplasty;  Surgeon: Dorna Leitz, MD;    . Knee arthroscopy with lateral menisectomy  Left 05/25/2014    LeftKNEE ARTHROSCOPY WITH LATERAL MENISECTOMY;  Surgeon: Dorna Leitz, MD  . Chondroplasty Left 05/25/2014    Procedure: Left  CHONDROPLASTY;  Surgeon: Dorna Leitz, MD   Family History  Problem Relation Age of Onset  . Colon cancer Father   . Stroke Father   . Cancer Father     Pancreatic   Social History  Substance Use Topics  . Smoking status: Never Smoker   . Smokeless tobacco: Never Used  . Alcohol Use: No    ROS Constitutional: Denies fever, chills, weight loss/gain, headaches, insomnia,  night sweats, and change in appetite. Does c/o fatigue. Eyes: Denies redness, blurred vision, diplopia, discharge, itchy, watery eyes.  ENT: Denies discharge, congestion, post nasal drip, epistaxis, sore throat, earache, hearing loss, dental pain, Tinnitus, Vertigo, Sinus pain, snoring.  Cardio: Denies chest pain, palpitations, irregular heartbeat, syncope, dyspnea, diaphoresis, orthopnea, PND, claudication, edema Respiratory: denies cough, dyspnea, DOE, pleurisy, hoarseness, laryngitis, wheezing.  Gastrointestinal: Denies dysphagia, heartburn, reflux, water brash, pain, cramps, nausea, vomiting, bloating, diarrhea, constipation, hematemesis, melena, hematochezia, jaundice, hemorrhoids Genitourinary: Denies dysuria, frequency, urgency, nocturia, hesitancy, discharge, hematuria, flank pain Breast: Breast lumps, nipple discharge, bleeding.  Musculoskeletal: Denies arthralgia, myalgia, stiffness, Jt. Swelling, pain, limp, and strain/sprain. Denies falls. Skin: Denies puritis, rash, hives, warts, acne, eczema, changing in skin lesion Neuro: No weakness, tremor, incoordination, spasms, paresthesia, pain Psychiatric: Denies confusion, memory loss, sensory loss. Denies Depression. Endocrine: Denies change in weight, skin, hair change, nocturia, and paresthesia, diabetic polys, visual blurring, hyper / hypo glycemic episodes.  Heme/Lymph: No excessive bleeding, bruising, enlarged lymph  nodes.  Physical Exam  BP 134/72 mmHg  Pulse 60  Temp(Src) 97 F (36.1 C)  Resp 16  Ht 5' 1.5" (1.562 m)  Wt 121 lb (54.885 kg)  BMI 22.50 kg/m2  General Appearance: Well nourished and in no apparent distress.  Eyes: PERRLA, EOMs, conjunctiva no swelling or erythema, normal fundi and vessels. Sinuses: No frontal/maxillary tenderness ENT/Mouth: EACs patent / TMs  nl. Nares clear without erythema, swelling, mucoid exudates. Oral hygiene is good. No erythema, swelling, or exudate. Tongue normal, non-obstructing. Tonsils not swollen or erythematous. Hearing normal.  Neck: Supple, thyroid normal. No bruits, nodes or JVD. Respiratory: Respiratory effort normal.  BS equal and clear bilateral without rales, rhonci, wheezing or stridor. Cardio: Heart sounds are normal with regular rate and rhythm and no murmurs, rubs or gallops. Peripheral pulses are normal and equal bilaterally without edema. No aortic or femoral bruits. Chest: symmetric with normal excursions and percussion. Breasts: Symmetric, without lumps, nipple discharge, retractions, or fibrocystic changes.  Abdomen: Flat, soft, with bowel sounds. Nontender, no guarding,  rebound, hernias, masses, or organomegaly.  Lymphatics: Non tender without lymphadenopathy.  Musculoskeletal: Full ROM all peripheral extremities, joint stability, 5/5 strength, and normal gait. Skin: Warm and dry without rashes, lesions, cyanosis, clubbing or  ecchymosis.  Neuro: Cranial nerves intact, reflexes equal bilaterally. Normal muscle tone, no cerebellar symptoms. Sensation intact.  Pysch: Alert and oriented X 3, normal affect, Insight and Judgment appropriate.   Assessment and Plan  1. Essential hypertension  - Microalbumin / creatinine urine ratio - EKG 12-Lead - [place], RETROPERITNL ABD,  LTD - TSH  2. Hyperlipidemia  - Lipid panel - TSH  3. PreDiabetes  - Hemoglobin A1c - Insulin, random  4. Vitamin D deficiency  - VITAMIN D 25 Hydroxy    5. Hypothyroidism  - TSH  6. Gastroesophageal reflux disease   7. Thyroid Cancer,Hx  - TSH  8. History of left breast cancer   9. Screening for rectal cancer  - POC Hemoccult Bld/Stl   10. Medication management   11. Screening for AAA (aortic abdominal aneurysm)   12. Screening for ischemic heart disease  - Urinalysis, Routine w reflex microscopic - CBC with Differential/Platelet - BASIC METABOLIC PANEL WITH GFR - Hepatic function panel - Magnesium   Continue prudent diet as discussed, weight control, BP monitoring, regular exercise, and medications. Discussed med's effects and SE's. Screening labs and tests as requested with regular follow-up as recommended. Over 40 minutes of exam, counseling, chart review and high complex critical decision making was performed.

## 2015-07-19 NOTE — Patient Instructions (Signed)

## 2015-07-20 LAB — URINALYSIS, ROUTINE W REFLEX MICROSCOPIC
Bilirubin Urine: NEGATIVE
Glucose, UA: NEGATIVE
Ketones, ur: NEGATIVE
LEUKOCYTES UA: NEGATIVE
NITRITE: NEGATIVE
Protein, ur: NEGATIVE
SPECIFIC GRAVITY, URINE: 1.006 (ref 1.001–1.035)
pH: 6 (ref 5.0–8.0)

## 2015-07-20 LAB — URINALYSIS, MICROSCOPIC ONLY
Bacteria, UA: NONE SEEN [HPF]
CASTS: NONE SEEN [LPF]
CRYSTALS: NONE SEEN [HPF]
SQUAMOUS EPITHELIAL / LPF: NONE SEEN [HPF] (ref <=5)
WBC, UA: NONE SEEN WBC/HPF (ref <=5)
YEAST: NONE SEEN [HPF]

## 2015-07-20 LAB — VITAMIN D 25 HYDROXY (VIT D DEFICIENCY, FRACTURES): Vit D, 25-Hydroxy: 67 ng/mL (ref 30–100)

## 2015-07-20 LAB — INSULIN, RANDOM: INSULIN: 4.7 u[IU]/mL (ref 2.0–19.6)

## 2015-07-20 LAB — MICROALBUMIN / CREATININE URINE RATIO
CREATININE, URINE: 20 mg/dL (ref 20–320)
Microalb, Ur: <0.2 mg/dL

## 2015-08-11 ENCOUNTER — Encounter: Payer: Self-pay | Admitting: Nurse Practitioner

## 2015-08-11 ENCOUNTER — Ambulatory Visit (HOSPITAL_BASED_OUTPATIENT_CLINIC_OR_DEPARTMENT_OTHER): Payer: PPO | Admitting: Nurse Practitioner

## 2015-08-11 VITALS — BP 153/73 | HR 68 | Temp 97.6°F | Resp 18 | Ht 61.5 in | Wt 120.8 lb

## 2015-08-11 DIAGNOSIS — Z853 Personal history of malignant neoplasm of breast: Secondary | ICD-10-CM | POA: Diagnosis not present

## 2015-08-11 DIAGNOSIS — C50912 Malignant neoplasm of unspecified site of left female breast: Secondary | ICD-10-CM

## 2015-08-11 NOTE — Patient Instructions (Addendum)
Thank you for coming in today!  As we discussed, please continue to perform your self breast exam and report any changes. If you note any new symptoms (please see below), be sure to notify us ASAP.  Your mammogram will be due in October 2017 and we will enter orders for it today.  We'll have you return in one year's time for your next appointment or sooner if you have any problems. Please be sure to stop by scheduling on your way out to make those appointment(s). Please keep checking your blood pressure and let Dr. Melford Aase know if it is staying high.  Looking forward to working with you in the future!  Let us know if you have any questions!  Symptoms to Watch for and Report to Your Provider  . Return of the cancer symptoms you had before- such as a lump or new growth where your cancer first started . New or unusual pain that seems unrelated to an injury and does not go away, including back pain or bone pain . Weight loss without trying/intending . Unexplained bleeding . A rash or allergic reaction, such as swelling, severe itching or wheezing . Chills or fevers . Persistent headaches . Shortness of breath or difficulty breathing . Bloody stools or blood in your urine . Lumps, bumps, swelling and/or nipple discharge . Nausea, vomiting, diarrhea, loss of appetite, or trouble swallowing . A cough that doesn't go away . Abdominal pain . Swelling in your arms or legs . Fractures . Hot flashes or other menopausal symptoms . Any other signs mentioned by your doctor or nurse or any unusual symptoms                 that you just can't explain   NOTE: Just because you have certain symptoms, it doesn't mean the cancer has come back or you have a new cancer. Symptoms can be due to other problems that need to be addressed.  It is important to watch for these symptoms and report them to your provider so you can be medically evaluated for any of these concerns!     Living a Life of Wellness After Cancer:   *Note: Please consult your health care provider before using any medications, supplements, over-the-counter products, or other interventions.  Also, please consult your primary care provider before you begin any lifestyle program (diet, exercise, etc.).  Your safety is our top priority and we want to make sure you continue to live a long and healthy life!    Healthy Lifestyle Recommendations  As a cancer survivor, it is important develop a lifelong commitment to a healthy lifestyle. A healthy lifestyle can prevent cancer from returning as well as prevent other diseases like heart disease, diabetes and high blood pressure.  These are some things that you can do to have a healthy lifestyle:  Marland Kitchen Maintain a healthy weight.  . Exercise daily per your doctor's orders. . Eat a balanced diet high in fruits, vegetables, bran, and fiber. Limit intake of red meat      and processed foods.  . Limit how much alcohol you consume, if at all. Ali Lowe regular bone mineral density testing for osteoporosis.  . Talk to your doctor about cardiovascular disease or "heart disease" screening. . Stop smoking (if you smoke). . Know your family history. . Be mindful of your emotional, social, and spiritual needs. . Meet regularly with a Primary Care Provider (PCP). Find a PCP if you do not  already have one. . Talk to your doctor about regular cancer screening including screening for colon           cancer, GYN cancers, and skin cancer.

## 2015-08-11 NOTE — Progress Notes (Signed)
CLINIC:  Cancer Survivorship   REASON FOR VISIT:  Routine follow-up post-treatment for history of breast cancer.  BRIEF ONCOLOGIC HISTORY:    History of left breast cancer   11/07/2008 Initial Diagnosis 3 mm invasive ductal carcinoma with DCIS and calcifications/necrosis, stage 1, T1a, N0, ER 100%, PR 0%, Ki-67 10%, HER2/neu negative by [facility] with a ratio 0.87.    11/23/2008 Surgery Left lumpectomy: no residual invasive carcinoma identified, pTX, pN0, ER 100%, PR 0%, Ki-67 10%, HER2/neu by [facility] no amplification, 0/3 positive lymph nodes   01/05/2009 - 02/17/2009 Radiation Therapy Adjuvant XRT   02/22/2009 - 02/23/2014 Anti-estrogen oral therapy Tamoxifen from 02/2009 to 06/2011, switched to Arimidex from 06/2011    INTERVAL HISTORY:  Haley Shah presents to the [facility] today for ongoing follow up regarding her history of breast cancer. Overall, Haley Shah reports doing well since her last visit with Haley Shah.   She has not noticed any change within her breast and her last mammogram was in October 2016 and unremarkable.  She has completed her endocrine therapy. She does have bilateral knee pain (right greater than left), which is ongoing and unchanged.  She states that eventually she plans for knee replacement.    She denies any headache, cough, shortness of breath, or night sweats. She reports a good appetite.  She has lost weight since her last visit due to a "viral bug" she had in January 2017, where she lost 12 pounds, and difficulty with her thyroid, which Haley Shah is adjusting.  She has follow up labs next week.  Her blood pressure is also slightly higher today.  She states that she does have "white coat hypertension."  REVIEW OF SYSTEMS:  General: Denies fever, chills, unintentional weight loss, or generalized fatigue.  HEENT: Wears glasses.  Denies visual changes, hearing loss, mouth sores, or difficulty swallowing. Cardiac: Denies palpitations and lower extremity edema.    Respiratory: Denies wheeze or dyspnea on exertion.  Breast: As above.  GI: heartburn. Denies abdominal pain, constipation, diarrhea, nausea, or vomiting.  GU: Denies dysuria, hematuria, vaginal bleeding, vaginal discharge, or vaginal dryness.  Musculoskeletal: As above. Neuro: Denies recent fall or numbness / tingling in her extremities.  Skin: Denies rash, pruritis, or open wounds.  Psych: Denies depression, anxiety, insomnia, or memory loss.   A 14-point review of systems was completed and was negative, except as noted above.   ONCOLOGY TREATMENT TEAM:  1. Surgeon:  Haley Shah at [facility]  2. Medical Oncologist: Haley Shah / Humphrey Shah / Truddie Shah (previously) 3. Radiation Oncologist: Haley Shah    PAST MEDICAL/SURGICAL HISTORY:  Past Medical History  Diagnosis Date  . GERD (gastroesophageal reflux disease)   . History of thyroid cancer   . IBS (irritable bowel syndrome)   . Sciatica     RLE  . Hyperlipidemia   . Cancer [facility]) 2010    Left Breast  . Vitamin D deficiency   . Pre-diabetes   . Allergy   . Thyroid disease     Hypo  . Glaucoma   . Hypertension     no meds  . Dyspnea     chronic-no cardiac reason  . Wears glasses    Past Surgical History  Procedure Laterality Date  . Thyroid surgery  1986    partial thyroidectomy  . Breast lumpectomy  2010    LEFT BREAST-snbx  . Colonoscopy    . Cardiac catheterization  2006    normal coronary arteries  . Knee  arthroscopy with medial menisectomy Left 05/25/2014    Procedure: LEFT ARTHROSCOPY KNEE medial and lateral meniscectomy,femoral patella chondroplasty;  Surgeon: Dorna Leitz, MD;  Location: [place];  Service: Orthopedics;  Laterality: Left;  . Knee arthroscopy with lateral menisectomy Left 05/25/2014    Procedure: KNEE ARTHROSCOPY WITH LATERAL MENISECTOMY;  Surgeon: Dorna Leitz, MD;  Location: [place];  Service: Orthopedics;  Laterality: Left;  . Chondroplasty Left  05/25/2014    Procedure: CHONDROPLASTY;  Surgeon: Dorna Leitz, MD;  Location: [place];  Service: Orthopedics;  Laterality: Left;     ALLERGIES:  Allergies  Allergen Reactions  . Neomycin-Bacitracin Zn-Polymyx     REACTION: rash  . Propoxyphene Hcl     REACTION: syncope  . Darvon Other (See Comments)    Almost fainted when taking, over 50 years.     CURRENT MEDICATIONS:  Current Outpatient Prescriptions on File Prior to Visit  Medication Sig Dispense Refill  . aspirin 81 MG tablet Take 81 mg by mouth daily.    Marland Kitchen latanoprost (XALATAN) 0.005 % ophthalmic solution Place 1 drop into both eyes at bedtime.    Marland Kitchen levothyroxine (SYNTHROID, LEVOTHROID) 100 MCG tablet TAKE ONE TABLET BY MOUTH ONCE DAILY 90 tablet 0  . meloxicam (MOBIC) 15 MG tablet Take 1 tablet (15 mg total) by mouth as needed for pain. 30 tablet 3  . Multiple Vitamins-Minerals (MULTIVITAMIN PO) Take by mouth daily.    Marland Kitchen OVER THE COUNTER MEDICATION OTC Fiber Gummies    . OVER THE COUNTER MEDICATION daily. VitaFusion Women's Multivitamin    . VITAMIN D, CHOLECALCIFEROL, PO Take 2,000 Int'l Units by mouth.     . Wheat Dextrin (BENEFIBER) POWD Take 2 scoop by mouth daily.     No current facility-administered medications on file prior to visit.     ONCOLOGIC FAMILY HISTORY:  Family History  Problem Relation Age of Onset  . Colon cancer Father   . Stroke Father   . Cancer Father     Pancreatic     GENETIC COUNSELING/TESTING: No   SOCIAL HISTORY:  Haley Shah is married and lives with her spouse in [place], [place].   Haley Shah is currently retired. She denies any current or history of tobacco, alcohol, or illicit drug use.    PHYSICAL EXAMINATION:  Vital Signs: Filed Vitals:   08/11/15 1144  BP: 158/68  Pulse: 64  Temp: 97.6 F (36.4 C)  Resp: 18   Weight: 120.8 (decrease of 13 pounds since last year) ECOG performance status: 0 General: Well-nourished, well-appearing  female in no acute distress.  She is  unaccompanied in clinic today.   HEENT: Head is atraumatic and normocephalic.  Pupils equal and reactive to light and accomodation. Conjunctivae clear without exudate.  Sclerae anicteric. Oral mucosa is pink, moist, and intact without lesions.  Oropharynx is pink without lesions or erythema.  Lymph: No cervical, supraclavicular, infraclavicular, or axillary lymphadenopathy noted on palpation.  Cardiovascular: Regular rate and rhythm without murmurs, rubs, or gallops. Respiratory: Clear to auscultation bilaterally. Chest expansion symmetric without accessory muscle use on inspiration or expiration.  Breast: Bilateral breast exam performed.  Left lumpectomy scar intact, well healed without nodularity.  No mass or lesion in either breast. GI: Abdomen soft and round. No tenderness to palpation. Bowel sounds normoactive in 4 quadrants. No hepatosplenomegaly.   GU: Deferred.  Musculoskeletal: Muscle strength 5/5 in all extremities.   Neuro: No focal deficits. Steady gait.  Psych: Mood and  affect normal and appropriate for situation.  Extremities: No edema, cyanosis, or clubbing.  Skin: Warm and dry. No open lesions noted.   LABORATORY DATA:  Recent Results (from the past 2160 hour(s))  Microalbumin / creatinine urine ratio     Status: None   Collection Time: 07/19/15 10:13 AM  Result Value Ref Range   Creatinine, Urine 20 20 - 320 mg/dL   Microalb, Ur <0.2 Not estab mg/dL   Microalb Creat Ratio SEE NOTE <30 mcg/mg creat    Comment: The microalbumin value is less than 0.2 mg/dL therefore we are unable to calculate excretion and/or creatinine ratio. The ADA has defined abnormalities in albumin excretion as follows:           Category           Result                            (mcg/mg creatinine)                 Normal:    <30       Microalbuminuria:    30 - 299   Clinical albuminuria:    > or = 300   The ADA recommends that at least two of three  specimens collected within a 3 - 6 month period be abnormal before considering a patient to be within a diagnostic category.     Urinalysis, Routine w reflex microscopic (not at [facility])     Status: Abnormal   Collection Time: 07/19/15 10:13 AM  Result Value Ref Range   Color, Urine YELLOW YELLOW    Comment: ** Please note change in unit of measure and reference range(s). **      APPearance CLEAR CLEAR   Specific Gravity, Urine 1.006 1.001 - 1.035   pH 6.0 5.0 - 8.0   Glucose, UA NEGATIVE NEGATIVE   Bilirubin Urine NEGATIVE NEGATIVE   Ketones, ur NEGATIVE NEGATIVE   Hgb urine dipstick 1+ (A) NEGATIVE   Protein, ur NEGATIVE NEGATIVE   Nitrite NEGATIVE NEGATIVE   Leukocytes, UA NEGATIVE NEGATIVE  CBC with Differential/Platelet     Status: None   Collection Time: 07/19/15 10:13 AM  Result Value Ref Range   WBC 6.4 3.8 - 10.8 K/uL   RBC 4.51 3.80 - 5.10 MIL/uL   Hemoglobin 13.0 11.7 - 15.5 g/dL   HCT 40.0 35.0 - 45.0 %   MCV 88.7 80.0 - 100.0 fL   MCH 28.8 27.0 - 33.0 pg   MCHC 32.5 32.0 - 36.0 g/dL   RDW 14.6 11.0 - 15.0 %   Platelets 247 140 - 400 K/uL   MPV 9.3 7.5 - 12.5 fL   Neutro Abs 3584 1500 - 7800 cells/uL   Lymphs Abs 1856 850 - 3900 cells/uL   Monocytes Absolute 768 200 - 950 cells/uL   Eosinophils Absolute 128 15 - 500 cells/uL   Basophils Absolute 64 0 - 200 cells/uL   Neutrophils Relative % 56 %   Lymphocytes Relative 29 %   Monocytes Relative 12 %   Eosinophils Relative 2 %   Basophils Relative 1 %   Smear Review Criteria for review not met     Comment: ** Please note change in unit of measure and reference range(s). **  BASIC METABOLIC PANEL WITH GFR     Status: None   Collection Time: 07/19/15 10:13 AM  Result Value Ref Range   Sodium 138 135 - 146  mmol/L   Potassium 4.6 3.5 - 5.3 mmol/L   Chloride 103 98 - 110 mmol/L   CO2 24 20 - 31 mmol/L   Glucose, Bld 87 65 - 99 mg/dL   BUN 13 7 - 25 mg/dL   Creat 0.81 0.60 - 0.93 mg/dL   Calcium 9.7 8.6 - 10.4  mg/dL   GFR, Est African American 82 >=60 mL/min   GFR, Est Non African American 71 >=60 mL/min    Comment:   The estimated GFR is a calculation valid for adults (>=58 years old) that uses the CKD-EPI algorithm to adjust for age and sex. It is   not to be used for children, pregnant women, hospitalized patients,    patients on dialysis, or with rapidly changing kidney function. According to the NKDEP, eGFR >89 is normal, 60-89 shows mild impairment, 30-59 shows moderate impairment, 15-29 shows severe impairment and <15 is ESRD.     Hepatic function panel     Status: None   Collection Time: 07/19/15 10:13 AM  Result Value Ref Range   Total Bilirubin 0.5 0.2 - 1.2 mg/dL   Bilirubin, Direct 0.1 <=0.2 mg/dL   Indirect Bilirubin 0.4 0.2 - 1.2 mg/dL   Alkaline Phosphatase 74 33 - 130 U/L   AST 16 10 - 35 U/L   ALT 8 6 - 29 U/L   Total Protein 6.7 6.1 - 8.1 g/dL   Albumin 4.4 3.6 - 5.1 g/dL  Magnesium     Status: None   Collection Time: 07/19/15 10:13 AM  Result Value Ref Range   Magnesium 2.1 1.5 - 2.5 mg/dL  Lipid panel     Status: None   Collection Time: 07/19/15 10:13 AM  Result Value Ref Range   Cholesterol 170 125 - 200 mg/dL   Triglycerides 86 <150 mg/dL   HDL 59 >=46 mg/dL   Total CHOL/HDL Ratio 2.9 <=5.0 Ratio   VLDL 17 <30 mg/dL   LDL Cholesterol 94 <130 mg/dL    Comment:   Total Cholesterol/HDL Ratio:CHD Risk                        Coronary Heart Disease Risk Table                                        Men       Women          1/2 Average Risk              3.4        3.3              Average Risk              5.0        4.4           2X Average Risk              9.6        7.1           3X Average Risk             23.4       11.0 Use the calculated Patient Ratio above and the CHD Risk table  to determine the patient's CHD Risk.   TSH     Status: Abnormal   Collection Time: 07/19/15 10:13 AM  Result Value  Ref Range   TSH 0.17 (L) mIU/L    Comment:   Reference  Range   > or = 20 Years  0.40-4.50   Pregnancy Range First trimester  0.26-2.66 Second trimester 0.55-2.73 Third trimester  0.43-2.91     Hemoglobin A1c     Status: None   Collection Time: 07/19/15 10:13 AM  Result Value Ref Range   Hgb A1c MFr Bld 5.3 <5.7 %    Comment:   For the purpose of screening for the presence of diabetes:   <5.7%       Consistent with the absence of diabetes 5.7-6.4 %   Consistent with increased risk for diabetes (prediabetes) >=6.5 %     Consistent with diabetes   This assay result is consistent with a decreased risk of diabetes.   Currently, no consensus exists regarding use of hemoglobin A1c for diagnosis of diabetes in children.   According to American Diabetes Association (ADA) guidelines, hemoglobin A1c <7.0% represents optimal control in non-pregnant diabetic patients. Different metrics may apply to specific patient populations. Standards of Medical Care in Diabetes (ADA).      Mean Plasma Glucose 105 mg/dL  Insulin, random     Status: None   Collection Time: 07/19/15 10:13 AM  Result Value Ref Range   Insulin 4.7 2.0 - 19.6 uIU/mL    Comment: ** Please note change in reference range(s). **   This insulin assay shows strong cross-reactivity for some insulin analogs (lispro, aspart, and glargine) and much lower cross-reactivity with others (detemir, glulisine).   VITAMIN D 25 Hydroxy (Vit-D Deficiency, Fractures)     Status: None   Collection Time: 07/19/15 10:13 AM  Result Value Ref Range   Vit D, 25-Hydroxy 67 30 - 100 ng/mL    Comment: Vitamin D Status           25-OH Vitamin D        Deficiency                <20 ng/mL        Insufficiency         20 - 29 ng/mL        Optimal             > or = 30 ng/mL   For 25-OH Vitamin D testing on patients on D2-supplementation and patients for whom quantitation of D2 and D3 fractions is required, the QuestAssureD 25-OH VIT D, (D2,D3), LC/MS/MS is recommended: order code [id number] (patients >  2 yrs).   Urine Microscopic     Status: None   Collection Time: 07/19/15 10:13 AM  Result Value Ref Range   WBC, UA NONE SEEN <=5 WBC/HPF   RBC / HPF 0-2 <=2 RBC/HPF   Squamous Epithelial / LPF NONE SEEN <=5 HPF   Bacteria, UA NONE SEEN NONE SEEN HPF   Crystals NONE SEEN NONE SEEN HPF   Casts NONE SEEN NONE SEEN LPF   Yeast NONE SEEN NONE SEEN HPF    DIAGNOSTIC IMAGING: Bilateral screening mamm[facility] performed 12/29/[facility] reveals breast composition category B.  Post operative changes in the left breast with no significant mass, calcification or other finding in either breast.  Recommended follow up in one year.     ASSESSMENT AND PLAN:   1. History of breast cancer: Stage IA invasive ductal carcinoma with DCIS of the left breast (10/2008), ER positive, PR negative, HER2/neu negative, S/P lumpectomy (11/2008) followed by adjuvant radiation therapy (completed 02/2009) followed by five  years of adjvuant endocrine therapy (completed 02/2014), now followed in a program of surveillance. Recent labs per Haley Shah.  Haley Shah is doing well with no clinical symptoms worrisome for cancer recurrence at this time. I have reviewed the recommendations for ongoing surveillance with her and she will follow-up with [place] in the Survivorship program in one year's time with history and physical exam per surveillance protocol.  She was instructed to make [place] aware if she notes any change within her breast, any new symptoms such as pain, shortness of breath, weight loss, or fatigue.  Of note, as above her sister was diagnosed at age 44 with ovarian cancer and her nephew also has a history of multiple malignancies beginning at age 53.  We discussed the possibility of genetic counseling / testing if she is felt to be an appropriate candidate.  She will consider this and let us know.    2. Blood pressure: she will monitor this at home and notify Haley Shah if it stays elevated.  3. Cancer screening:  Due to  Haley Shah's history and her age, she should receive screening for skin cancers, colon cancer, and gynecologic cancers.  The information and recommendations were shared with the patient and in her written after visit summary.  4. Health maintenance and wellness promotion:Haley Shah and I discussed recommendations to maximize nutrition and minimize recurrence, such as increased intake of fruits, vegetables, lean proteins, and minimizing the intake of red meats and processed foods.  She was also encouraged to engage in moderate to vigorous exercise for 30 minutes per day most days of the week. She was instructed to limit her alcohol consumption and continue to abstain from tobacco use.    5. Support services/counseling:  Haley Shah was offered support today through active listening and expressive supportive counseling.  She was given information regarding our available services and encouraged to contact me with any questions or for help enrolling in any of our support group/programs.   A total of 30 minutes of face-to-face time was spent with this patient with greater than 50% of that time in counseling and care-coordination.   Sylvan Cheese, NP  Survivorship Program [facility] [phone]   Note: PRIMARY CARE PROVIDER Alesia Richards, [place] [phone]

## 2015-08-17 ENCOUNTER — Ambulatory Visit (INDEPENDENT_AMBULATORY_CARE_PROVIDER_SITE_OTHER): Payer: PPO

## 2015-08-17 DIAGNOSIS — E039 Hypothyroidism, unspecified: Secondary | ICD-10-CM | POA: Diagnosis not present

## 2015-08-17 LAB — TSH: TSH: 0.63 m[IU]/L

## 2015-08-17 NOTE — Progress Notes (Signed)
Pt presents for lab only TSH recheck. Pt states she is taking her meds as follow: 1 tab 3 x/ week on Mon Wed Fri and 1/2 tablet the other 4 days Tues Thurs Sat & Sun

## 2015-08-21 ENCOUNTER — Other Ambulatory Visit: Payer: Self-pay | Admitting: *Deleted

## 2015-08-21 DIAGNOSIS — Z1212 Encounter for screening for malignant neoplasm of rectum: Secondary | ICD-10-CM

## 2015-08-21 LAB — POC HEMOCCULT BLD/STL (HOME/3-CARD/SCREEN)
FECAL OCCULT BLD: NEGATIVE
FECAL OCCULT BLD: NEGATIVE
FECAL OCCULT BLD: NEGATIVE

## 2015-09-14 DIAGNOSIS — M1711 Unilateral primary osteoarthritis, right knee: Secondary | ICD-10-CM | POA: Diagnosis not present

## 2015-09-21 DIAGNOSIS — M1711 Unilateral primary osteoarthritis, right knee: Secondary | ICD-10-CM | POA: Diagnosis not present

## 2015-09-21 DIAGNOSIS — M1712 Unilateral primary osteoarthritis, left knee: Secondary | ICD-10-CM | POA: Diagnosis not present

## 2015-09-28 DIAGNOSIS — M1711 Unilateral primary osteoarthritis, right knee: Secondary | ICD-10-CM | POA: Diagnosis not present

## 2015-09-28 DIAGNOSIS — M1712 Unilateral primary osteoarthritis, left knee: Secondary | ICD-10-CM | POA: Diagnosis not present

## 2015-10-05 DIAGNOSIS — M1712 Unilateral primary osteoarthritis, left knee: Secondary | ICD-10-CM | POA: Diagnosis not present

## 2015-10-05 DIAGNOSIS — M1711 Unilateral primary osteoarthritis, right knee: Secondary | ICD-10-CM | POA: Diagnosis not present

## 2015-10-09 ENCOUNTER — Other Ambulatory Visit: Payer: Self-pay | Admitting: Internal Medicine

## 2015-10-12 DIAGNOSIS — M1711 Unilateral primary osteoarthritis, right knee: Secondary | ICD-10-CM | POA: Diagnosis not present

## 2015-10-12 DIAGNOSIS — M1712 Unilateral primary osteoarthritis, left knee: Secondary | ICD-10-CM | POA: Diagnosis not present

## 2015-10-27 DIAGNOSIS — M1712 Unilateral primary osteoarthritis, left knee: Secondary | ICD-10-CM | POA: Diagnosis not present

## 2015-10-31 ENCOUNTER — Ambulatory Visit: Payer: Self-pay | Admitting: Internal Medicine

## 2015-11-09 ENCOUNTER — Ambulatory Visit (INDEPENDENT_AMBULATORY_CARE_PROVIDER_SITE_OTHER): Payer: PPO | Admitting: Physician Assistant

## 2015-11-09 ENCOUNTER — Encounter: Payer: Self-pay | Admitting: Physician Assistant

## 2015-11-09 VITALS — BP 136/72 | HR 60 | Temp 97.5°F | Resp 14 | Ht 61.5 in | Wt 122.4 lb

## 2015-11-09 DIAGNOSIS — E782 Mixed hyperlipidemia: Secondary | ICD-10-CM | POA: Diagnosis not present

## 2015-11-09 DIAGNOSIS — I10 Essential (primary) hypertension: Secondary | ICD-10-CM

## 2015-11-09 DIAGNOSIS — Z79899 Other long term (current) drug therapy: Secondary | ICD-10-CM | POA: Diagnosis not present

## 2015-11-09 DIAGNOSIS — E559 Vitamin D deficiency, unspecified: Secondary | ICD-10-CM

## 2015-11-09 DIAGNOSIS — E039 Hypothyroidism, unspecified: Secondary | ICD-10-CM

## 2015-11-09 LAB — CBC WITH DIFFERENTIAL/PLATELET
BASOS PCT: 1 %
Basophils Absolute: 60 cells/uL (ref 0–200)
EOS ABS: 60 {cells}/uL (ref 15–500)
Eosinophils Relative: 1 %
HEMATOCRIT: 40.6 % (ref 35.0–45.0)
Hemoglobin: 13.3 g/dL (ref 11.7–15.5)
LYMPHS ABS: 1620 {cells}/uL (ref 850–3900)
Lymphocytes Relative: 27 %
MCH: 29.6 pg (ref 27.0–33.0)
MCHC: 32.8 g/dL (ref 32.0–36.0)
MCV: 90.4 fL (ref 80.0–100.0)
MONO ABS: 660 {cells}/uL (ref 200–950)
MPV: 9.1 fL (ref 7.5–12.5)
Monocytes Relative: 11 %
NEUTROS ABS: 3600 {cells}/uL (ref 1500–7800)
Neutrophils Relative %: 60 %
PLATELETS: 244 10*3/uL (ref 140–400)
RBC: 4.49 MIL/uL (ref 3.80–5.10)
RDW: 14.8 % (ref 11.0–15.0)
WBC: 6 10*3/uL (ref 3.8–10.8)

## 2015-11-09 LAB — BASIC METABOLIC PANEL WITH GFR
BUN: 16 mg/dL (ref 7–25)
CHLORIDE: 104 mmol/L (ref 98–110)
CO2: 26 mmol/L (ref 20–31)
Calcium: 9.7 mg/dL (ref 8.6–10.4)
Creat: 0.96 mg/dL — ABNORMAL HIGH (ref 0.60–0.93)
GFR, EST NON AFRICAN AMERICAN: 58 mL/min — AB (ref >=60)
GFR, Est African American: 66 mL/min (ref >=60)
GLUCOSE: 87 mg/dL (ref 65–99)
POTASSIUM: 4.5 mmol/L (ref 3.5–5.3)
Sodium: 141 mmol/L (ref 135–146)

## 2015-11-09 LAB — LIPID PANEL
Cholesterol: 208 mg/dL — ABNORMAL HIGH (ref 125–200)
HDL: 62 mg/dL (ref >=46)
LDL CALC: 125 mg/dL (ref <130)
TRIGLYCERIDES: 105 mg/dL (ref <150)
Total CHOL/HDL Ratio: 3.4 Ratio (ref <=5.0)
VLDL: 21 mg/dL (ref <30)

## 2015-11-09 LAB — HEPATIC FUNCTION PANEL
ALBUMIN: 4.4 g/dL (ref 3.6–5.1)
ALK PHOS: 68 U/L (ref 33–130)
ALT: 10 U/L (ref 6–29)
AST: 16 U/L (ref 10–35)
BILIRUBIN INDIRECT: 0.5 mg/dL (ref 0.2–1.2)
BILIRUBIN TOTAL: 0.6 mg/dL (ref 0.2–1.2)
Bilirubin, Direct: 0.1 mg/dL (ref <=0.2)
Total Protein: 6.8 g/dL (ref 6.1–8.1)

## 2015-11-09 LAB — TSH: TSH: 1.04 mIU/L

## 2015-11-09 LAB — MAGNESIUM: MAGNESIUM: 2.1 mg/dL (ref 1.5–2.5)

## 2015-11-09 MED ORDER — LEVOTHYROXINE SODIUM 100 MCG PO TABS: 100.0000 ug | ORAL_TABLET | Freq: Every day | ORAL | 3 refills | Status: DC

## 2015-11-09 NOTE — Patient Instructions (Signed)
Use a dropper or use a cap to put olive oil,mineral oil or canola oil in the effected ear- 2-3 times a week. Let it soak for 20-30 min then you can take a shower or use a baby bulb with warm water to wash out the ear wax.  Do not use Qtips  Essential Tremor A tremor is trembling or shaking that you cannot control. Most tremors affect the hands or arms. Tremors can also affect the head, vocal cords, face, and other parts of the body.  Essential tremor is a tremor without a known cause.  CAUSES Essential tremor has no known cause.  RISK FACTORS You may be at greater risk of essential tremor if:   You have a family member with essential tremor.   You are age 77 or older.   You take certain medicines. SIGNS AND SYMPTOMS The main sign of a tremor is uncontrolled and unintentional rhythmic shaking of a body part.  You may have difficulty eating with a spoon or fork.   You may have difficulty writing.   You may nod your head up and down or side to side.   You may have a quivering voice.  Your tremors:  May get worse over time.   May come and go.   May be more noticeable on one side of your body.   May get worse due to stress, fatigue, caffeine, and extreme heat or cold.  DIAGNOSIS Your health care provider can diagnose essential tremor based on your symptoms, medical history, and a physical examination. There is no single test to diagnose an essential tremor. However, your health care provider may perform a variety of tests to rule out other conditions. Tests may include:   Blood and urine tests.   Imaging studies of your brain, such as:   CT scan.   MRI.   A test that measures involuntary muscle movement (electromyogram). TREATMENT Your tremors may go away without treatment. Mild tremors may not need treatment if they do not affect your day-to-day life. Severe tremors may need to be treated using one or a combination of the following options:   Medicines.  This may include medicine that is injected.  Lifestyle changes.   Physical therapy.  HOME CARE INSTRUCTIONS  Take medicines only as directed by your health care provider.   Limit alcohol intake to no more than 1 drink per day for nonpregnant women and 2 drinks per day for men. One drink equals 12 oz of beer, 5 oz of wine, or 1 oz of hard liquor.  Do not use any tobacco products, including cigarettes, chewing tobacco, or electronic cigarettes. If you need help quitting, ask your health care provider.  Take medicines only as directed by your health care provider.   Avoid extreme heat or cold.   Limit the amount of caffeine you consumeas directed by your health care provider.   Try to get eight hours of sleep each night.  Find ways to manage your stress, such as meditation or yoga.  Keep all follow-up visits as directed by your health care provider. This is important. This includes any physical therapy visits. SEEK MEDICAL CARE IF:  You experience any changes in the location or intensity of your tremors.   You start having a tremor after starting a new medicine.   You have tremor with other symptoms such as:   Numbness.   Tingling.   Pain.   Weakness.   Your tremor gets worse.   Your tremor interferes  with your daily life.    This information is not intended to replace advice given to you by your health care provider. Make sure you discuss any questions you have with your health care provider.   Document Released: 03/25/2014 Document Reviewed: 03/25/2014 Elsevier Interactive Patient Education [employer].

## 2015-11-09 NOTE — Progress Notes (Signed)
Patient ID: TAYNA DOREMUS, female   DOB: 1940-08-02, 75 y.o.   MRN: [id number]  Assessment and Plan:  Hypertension:  -borderline high, continue to monitor at home -Continue medication,  -monitor blood pressure at home.  -Continue DASH diet.   -Reminder to go to the ER if any CP, SOB, nausea, dizziness, severe HA, changes vision/speech, left arm numbness and tingling, and jaw pain.  Cholesterol: -Continue diet and exercise.  -Check cholesterol.   Essential tremor Discussed avoiding triggers/stress/heat, declines medications at this itme.   Vitamin D Def: -check level -continue medications.   Hypothyroidism -check TSH level, continue medications the same, reminded to take on an empty stomach 30-26mins before food.   Continue diet and meds as discussed. Further disposition pending results of labs. Future Appointments Date Time Provider [place]  02/23/2016 11:00 AM Unk Pinto, MD [facility] None  08/20/2016 9:00 AM Unk Pinto, MD [facility] None    HPI 75 y.o. female  presents for 3 month follow up with hypertension, hyperlipidemia, prediabetes and vitamin D.   Her blood pressure has been controlled at home, today their BP is BP: 136/72.   She does workout. She denies chest pain, shortness of breath, dizziness.  She reports that she has been walking on trails when they go camping.     She is on cholesterol medication and denies myalgias. Her cholesterol is at goal. The cholesterol last visit was:   Lab Results  Component Value Date   CHOL 170 07/19/2015   HDL 59 07/19/2015   LDLCALC 94 07/19/2015   TRIG 86 07/19/2015   CHOLHDL 2.9 07/19/2015    She has been working on diet and exercise for prediabetes, and denies foot ulcerations, hyperglycemia, hypoglycemia , increased appetite, nausea, paresthesia of the feet, polydipsia, polyuria, visual disturbances, vomiting and weight loss. Last A1C in the office was:  Lab Results  Component Value Date   HGBA1C 5.3  07/19/2015   Patient is on Vitamin D supplement.  Lab Results  Component Value Date   VD25OH 67 07/19/2015     She has essential/intention tremor, does not want medication at this time,    Current Medications:  Current Outpatient Prescriptions on File Prior to Visit  Medication Sig Dispense Refill  . aspirin 81 MG tablet Take 81 mg by mouth daily.    Marland Kitchen latanoprost (XALATAN) 0.005 % ophthalmic solution Place 1 drop into both eyes at bedtime.    Marland Kitchen levothyroxine (SYNTHROID, LEVOTHROID) 100 MCG tablet TAKE ONE TABLET BY MOUTH ONCE DAILY 90 tablet 0  . meloxicam (MOBIC) 15 MG tablet Take 1 tablet (15 mg total) by mouth as needed for pain. 30 tablet 3  . Multiple Vitamins-Minerals (MULTIVITAMIN PO) Take by mouth daily.    . Omega-3 Fatty Acids (FISH OIL PO) Take 2 capsules by mouth daily.    Marland Kitchen OVER THE COUNTER MEDICATION OTC Fiber Gummies  OR   Benefiber.    . pravastatin (PRAVACHOL) 40 MG tablet Take 40 mg by mouth as directed. Half  Tablet   Tuesday,  Thursday, and  Saturday  Only.    Marland Kitchen VITAMIN D, CHOLECALCIFEROL, PO Take 2,000 Int'l Units by mouth.     . Wheat Dextrin (BENEFIBER) POWD Take 2 scoop by mouth daily as needed.      No current facility-administered medications on file prior to visit.     Medical History:  Past Medical History:  Diagnosis Date  . Allergy   . Cancer [facility]) 2010   Left Breast  .  Dyspnea    chronic-no cardiac reason  . GERD (gastroesophageal reflux disease)   . Glaucoma   . History of thyroid cancer   . Hyperlipidemia   . Hypertension    no meds  . IBS (irritable bowel syndrome)   . Pre-diabetes   . Sciatica    RLE  . Thyroid disease    Hypo  . Vitamin D deficiency   . Wears glasses     Allergies:  Allergies  Allergen Reactions  . Neomycin-Bacitracin Zn-Polymyx     REACTION: rash  . Propoxyphene Hcl     REACTION: syncope  . Darvon Other (See Comments)    Almost fainted when taking, over 50 years.     Review of Systems:  Review of  Systems  Constitutional: Negative for chills, fever and malaise/fatigue.  HENT: Negative for congestion, ear pain and sore throat.   Eyes: Negative.   Respiratory: Negative for cough, shortness of breath and wheezing.   Cardiovascular: Negative for chest pain, palpitations and leg swelling.  Gastrointestinal: Negative for blood in stool, constipation, diarrhea, heartburn and melena.  Genitourinary: Negative.   Musculoskeletal: Positive for joint pain.  Skin: Negative.   Neurological: Negative for dizziness, sensory change, loss of consciousness and headaches.  Psychiatric/Behavioral: Negative for depression. The patient has insomnia. The patient is not nervous/anxious.     Family history- Review and unchanged  Social history- Review and unchanged  Physical Exam: BP 136/72   Pulse 60   Temp 97.5 F (36.4 C)   Resp 14   Ht 5' 1.5" (1.562 m)   Wt 122 lb 6.4 oz (55.5 kg)   SpO2 97%   BMI 22.75 kg/m  Wt Readings from Last 3 Encounters:  11/09/15 122 lb 6.4 oz (55.5 kg)  08/11/15 120 lb 12.8 oz (54.8 kg)  07/19/15 121 lb (54.9 kg)    General Appearance: Well nourished well developed, in no apparent distress. Eyes: PERRLA, EOMs, conjunctiva no swelling or erythema ENT/Mouth: Ear canals with bilateral cerumen impaction Oropharynx moist, clear, without exudate, or postoropharyngeal swelling. Neck: Supple, thyroid normal,no cervical adenopathy  Respiratory: Respiratory effort normal, Breath sounds clear A&P without rhonchi, wheeze, or rale.  No retractions, no accessory usage. Cardio: RRR with no MRGs. Brisk peripheral pulses without edema.  Abdomen: Soft, + BS,  Non tender, no guarding, rebound, hernias, masses. Musculoskeletal: Full ROM, 5/5 strength, Normal gait Skin: Warm, dry without rashes, lesions, ecchymosis.  Neuro: Awake and oriented X 3, Cranial nerves intact. Normal muscle tone, no cerebellar symptoms. Psych: Normal affect, Insight and Judgment appropriate.    Vicie Mutters, PA-C 9:46 AM [facility]

## 2015-11-10 LAB — VITAMIN D 25 HYDROXY (VIT D DEFICIENCY, FRACTURES): Vit D, 25-Hydroxy: 63 ng/mL (ref 30–100)

## 2015-11-24 ENCOUNTER — Telehealth: Payer: Self-pay | Admitting: Physician Assistant

## 2015-11-24 ENCOUNTER — Other Ambulatory Visit: Payer: Self-pay | Admitting: Physician Assistant

## 2015-11-24 NOTE — Telephone Encounter (Signed)
Patient calling with ear fullness and vertigo this AM, has history of vertigo and took meclizine this aM, has tried debrox without getting wax out, vertigo has improved. No allergy symptoms, no HA, vision changes.   Will continue meclizine, will go to Er if worsening symptoms, will follow up Monday if not better.

## 2015-11-27 ENCOUNTER — Ambulatory Visit (INDEPENDENT_AMBULATORY_CARE_PROVIDER_SITE_OTHER): Payer: PPO | Admitting: Physician Assistant

## 2015-11-27 ENCOUNTER — Encounter: Payer: Self-pay | Admitting: Physician Assistant

## 2015-11-27 VITALS — BP 136/74 | HR 67 | Temp 97.7°F | Resp 14 | Ht 61.5 in | Wt 122.8 lb

## 2015-11-27 DIAGNOSIS — Z23 Encounter for immunization: Secondary | ICD-10-CM | POA: Diagnosis not present

## 2015-11-27 DIAGNOSIS — H8110 Benign paroxysmal vertigo, unspecified ear: Secondary | ICD-10-CM | POA: Diagnosis not present

## 2015-11-27 DIAGNOSIS — H6123 Impacted cerumen, bilateral: Secondary | ICD-10-CM

## 2015-11-27 NOTE — Progress Notes (Signed)
Subjective:    Patient ID: Haley Shah, female    DOB: 1941/01/01, 75 y.o.   MRN: [id number]  HPI 75 y.o. WF with long standing history of vertigo presents with ear fullness and vertigo x 11/24/15. Had tried debrox but did not get any wax out. Dizziness with looking up or bending down, "feels funny". Meclizine did help. Has had some rhinorrhea, but is on allergtec daily, denies HA, vision changes, weakness, nausea, etc.   Blood pressure 136/74, pulse 67, temperature 97.7 F (36.5 C), resp. rate 14, height 5' 1.5" (1.562 m), weight 122 lb 12.8 oz (55.7 kg), SpO2 95 %.  Medications Current Outpatient Prescriptions on File Prior to Visit  Medication Sig  . aspirin 81 MG tablet Take 81 mg by mouth daily.  Marland Kitchen latanoprost (XALATAN) 0.005 % ophthalmic solution Place 1 drop into both eyes at bedtime.  Marland Kitchen levothyroxine (SYNTHROID, LEVOTHROID) 100 MCG tablet Take 1 tablet (100 mcg total) by mouth daily.  . meloxicam (MOBIC) 15 MG tablet Take 1 tablet (15 mg total) by mouth as needed for pain.  . Multiple Vitamins-Minerals (MULTIVITAMIN PO) Take by mouth daily.  . Omega-3 Fatty Acids (FISH OIL PO) Take 2 capsules by mouth daily.  Marland Kitchen OVER THE COUNTER MEDICATION OTC Fiber Gummies  OR   Benefiber.  . pravastatin (PRAVACHOL) 40 MG tablet Take 40 mg by mouth as directed. Half  Tablet   Tuesday,  Thursday, and  Saturday  Only.  Marland Kitchen VITAMIN D, CHOLECALCIFEROL, PO Take 2,000 Int'l Units by mouth.   . Wheat Dextrin (BENEFIBER) POWD Take 2 scoop by mouth daily as needed.    No current facility-administered medications on file prior to visit.     Problem list She has Hypothyroidism; Hyperlipidemia; G E R D; IBS; Vertigo; Thyroid Cancer,Hx; History of left breast cancer; PreDiabetes; Vitamin D deficiency; Medication management; and Essential hypertension on her problem list.   Review of Systems  Constitutional: Negative.   HENT: Positive for ear pain, hearing loss and rhinorrhea. Negative for congestion,  dental problem, drooling, ear discharge, facial swelling, mouth sores, nosebleeds, postnasal drip, sinus pressure, sneezing, sore throat, tinnitus, trouble swallowing and voice change.   Respiratory: Negative.   Cardiovascular: Negative.   Gastrointestinal: Negative.   Genitourinary: Negative.   Musculoskeletal: Negative.   Skin: Negative.   Neurological: Negative.   Hematological: Negative.   Psychiatric/Behavioral: Negative.        Objective:   Physical Exam  Constitutional: She is oriented to person, place, and time. She appears well-developed and well-nourished.  HENT:  Right Ear: External ear normal. No mastoid tenderness. Tympanic membrane is not injected, not perforated, not erythematous, not retracted and not bulging. A middle ear effusion is present. Decreased hearing is noted.  Left Ear: External ear normal. No mastoid tenderness. Tympanic membrane is not injected, not perforated, not erythematous, not retracted and not bulging. A middle ear effusion is present. Decreased hearing is noted.  Nose: Right sinus exhibits no maxillary sinus tenderness. Left sinus exhibits no maxillary sinus tenderness.  Mouth/Throat: Uvula is midline, oropharynx is clear and moist and mucous membranes are normal.  bilateral with cerumen impaction  Eyes: Conjunctivae and EOM are normal. Pupils are equal, round, and reactive to light.  Neck: Neck supple.  Cardiovascular: Normal rate and regular rhythm.   Pulmonary/Chest: Effort normal and breath sounds normal. No respiratory distress. She has no wheezes.  Abdominal: Soft. Bowel sounds are normal.  Musculoskeletal: Normal range of motion.  Lymphadenopathy:    She  has no cervical adenopathy.  Neurological: She is alert and oriented to person, place, and time. She has normal strength. No cranial nerve deficit or sensory deficit. She displays a negative Romberg sign.  Skin: Skin is warm and dry.       Assessment & Plan:  Cerumen impaction- stop  using Qtips, irrigation used in the office without complications, use OTC drops/oil at home to prevent reoccurence  Vertigo- normal neuro- get on allegra, flonase, continue meclizine PRN, if any HA, changes vision/speech, imbalance, weakness go to the ER

## 2015-12-25 ENCOUNTER — Other Ambulatory Visit: Payer: Self-pay | Admitting: Internal Medicine

## 2015-12-25 ENCOUNTER — Encounter: Payer: Self-pay | Admitting: Physician Assistant

## 2015-12-25 MED ORDER — PRIMIDONE 50 MG PO TABS: 50.0000 mg | ORAL_TABLET | Freq: Every evening | ORAL | 0 refills | Status: DC | PRN

## 2015-12-25 MED ORDER — MECLIZINE HCL 25 MG PO TABS: 25.0000 mg | ORAL_TABLET | Freq: Three times a day (TID) | ORAL | 0 refills | Status: DC | PRN

## 2015-12-26 ENCOUNTER — Encounter: Payer: Self-pay | Admitting: Physician Assistant

## 2015-12-28 DIAGNOSIS — H2511 Age-related nuclear cataract, right eye: Secondary | ICD-10-CM | POA: Diagnosis not present

## 2015-12-28 DIAGNOSIS — H04123 Dry eye syndrome of bilateral lacrimal glands: Secondary | ICD-10-CM | POA: Diagnosis not present

## 2015-12-28 DIAGNOSIS — Z961 Presence of intraocular lens: Secondary | ICD-10-CM | POA: Diagnosis not present

## 2015-12-28 DIAGNOSIS — H401111 Primary open-angle glaucoma, right eye, mild stage: Secondary | ICD-10-CM | POA: Diagnosis not present

## 2015-12-28 DIAGNOSIS — H02831 Dermatochalasis of right upper eyelid: Secondary | ICD-10-CM | POA: Diagnosis not present

## 2015-12-28 DIAGNOSIS — H02834 Dermatochalasis of left upper eyelid: Secondary | ICD-10-CM | POA: Diagnosis not present

## 2015-12-28 DIAGNOSIS — H401122 Primary open-angle glaucoma, left eye, moderate stage: Secondary | ICD-10-CM | POA: Diagnosis not present

## 2016-01-09 DIAGNOSIS — Z1231 Encounter for screening mammogram for malignant neoplasm of breast: Secondary | ICD-10-CM | POA: Diagnosis not present

## 2016-01-09 DIAGNOSIS — Z853 Personal history of malignant neoplasm of breast: Secondary | ICD-10-CM | POA: Diagnosis not present

## 2016-02-23 ENCOUNTER — Other Ambulatory Visit: Payer: Self-pay | Admitting: Physician Assistant

## 2016-02-23 ENCOUNTER — Ambulatory Visit: Payer: Self-pay | Admitting: Internal Medicine

## 2016-02-23 ENCOUNTER — Ambulatory Visit (INDEPENDENT_AMBULATORY_CARE_PROVIDER_SITE_OTHER): Payer: PPO | Admitting: Physician Assistant

## 2016-02-23 ENCOUNTER — Encounter: Payer: Self-pay | Admitting: Physician Assistant

## 2016-02-23 VITALS — BP 132/80 | HR 64 | Temp 97.7°F | Resp 16 | Ht 61.5 in | Wt 125.2 lb

## 2016-02-23 DIAGNOSIS — N183 Chronic kidney disease, stage 3 unspecified: Secondary | ICD-10-CM

## 2016-02-23 DIAGNOSIS — K588 Other irritable bowel syndrome: Secondary | ICD-10-CM

## 2016-02-23 DIAGNOSIS — E559 Vitamin D deficiency, unspecified: Secondary | ICD-10-CM | POA: Diagnosis not present

## 2016-02-23 DIAGNOSIS — R7303 Prediabetes: Secondary | ICD-10-CM | POA: Diagnosis not present

## 2016-02-23 DIAGNOSIS — Z0000 Encounter for general adult medical examination without abnormal findings: Secondary | ICD-10-CM

## 2016-02-23 DIAGNOSIS — Z853 Personal history of malignant neoplasm of breast: Secondary | ICD-10-CM

## 2016-02-23 DIAGNOSIS — I10 Essential (primary) hypertension: Secondary | ICD-10-CM

## 2016-02-23 DIAGNOSIS — Z85850 Personal history of malignant neoplasm of thyroid: Secondary | ICD-10-CM

## 2016-02-23 DIAGNOSIS — R42 Dizziness and giddiness: Secondary | ICD-10-CM

## 2016-02-23 DIAGNOSIS — K219 Gastro-esophageal reflux disease without esophagitis: Secondary | ICD-10-CM

## 2016-02-23 DIAGNOSIS — E039 Hypothyroidism, unspecified: Secondary | ICD-10-CM

## 2016-02-23 DIAGNOSIS — Z79899 Other long term (current) drug therapy: Secondary | ICD-10-CM | POA: Diagnosis not present

## 2016-02-23 DIAGNOSIS — Z0001 Encounter for general adult medical examination with abnormal findings: Secondary | ICD-10-CM | POA: Diagnosis not present

## 2016-02-23 DIAGNOSIS — R6889 Other general symptoms and signs: Secondary | ICD-10-CM

## 2016-02-23 DIAGNOSIS — E782 Mixed hyperlipidemia: Secondary | ICD-10-CM | POA: Diagnosis not present

## 2016-02-23 MED ORDER — PRAVASTATIN SODIUM 40 MG PO TABS: 40.0000 mg | ORAL_TABLET | Freq: Every day | ORAL | 1 refills | Status: DC

## 2016-02-23 NOTE — Progress Notes (Signed)
MEDICARE WELLNESS AND 3 months  Assessment:    HYPOTHYROIDISM - TSH   Pre-diabetes at goal  OSTEOPENIA Due 2 year for DEXA, cont vitamin D   HYPERLIPIDEMIA - CBC with Differential - Hepatic function panel - BASIC METABOLIC PANEL WITH GFR - Lipid panel   Allergy  Vitamin D deficiency - Vit D  25 hydroxy (rtn osteoporosis monitoring)  Encounter for long-term (current) use of other medications - Magnesium  History of breast cancer Continue MGM   Plan:   During the course of the visit the patient was educated and counseled about appropriate screening and preventive services including:    Pneumococcal vaccine   Influenza vaccine  Td vaccine  Screening electrocardiogram  Screening mammography  Bone densitometry screening  Colorectal cancer screening  Diabetes screening  Glaucoma screening  Nutrition counseling    Subjective:   Haley Shah is a 75 y.o. female who presents for Medicare Annual Wellness Visit and 3 month /fu HTN, chol, preDM.  Her blood pressure has been controlled at home, today their BP is BP: 132/80 She does workout. She denies chest pain, shortness of breath, dizziness.  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  Lab Results  Component Value Date   GFRNONAA 58 (L) 11/09/2015   She is on cholesterol medication, lipitor 80 1/2 3 days a week. Her cholesterol is not at goal. The cholesterol last visit was:   Lab Results  Component Value Date   CHOL 208 (H) 11/09/2015   HDL 62 11/09/2015   LDLCALC 125 11/09/2015   TRIG 105 11/09/2015   CHOLHDL 3.4 11/09/2015     Last A1C in the office was:  Lab Results  Component Value Date   HGBA1C 5.3 07/19/2015  Has history of left breast cancer in 2009, gets regular visit.  Patient is on Vitamin D supplement. She has benign essential tremor.  She is on thyroid medication. Her medication was changed last visit, she is on 1/2 pill Sat/Sun and whole rest of the week. Patient denies nervousness and  palpitations.  Lab Results  Component Value Date   TSH 1.04 11/09/2015    Names of Other Physician/Practitioners you currently use: 1. [facility]- here for primary care 2. Dr. Katy Fitch, eye doctor, has appointment Jan 2017 3.  Dr. Harmon Pier , dentist, last visit 6 months ago Patient Care Team: Unk Pinto, MD as PCP - General (Internal Medicine)  Medication Review Current Outpatient Prescriptions on File Prior to Visit  Medication Sig Dispense Refill  . aspirin 81 MG tablet Take 81 mg by mouth daily.    Marland Kitchen latanoprost (XALATAN) 0.005 % ophthalmic solution Place 1 drop into both eyes at bedtime.    Marland Kitchen levothyroxine (SYNTHROID, LEVOTHROID) 100 MCG tablet Take 1 tablet (100 mcg total) by mouth daily. 90 tablet 3  . meclizine (ANTIVERT) 25 MG tablet Take 1 tablet (25 mg total) by mouth 3 (three) times daily as needed for dizziness. 90 tablet 0  . meloxicam (MOBIC) 15 MG tablet Take 1 tablet (15 mg total) by mouth as needed for pain. 30 tablet 3  . Multiple Vitamins-Minerals (MULTIVITAMIN PO) Take by mouth daily.    . Omega-3 Fatty Acids (FISH OIL PO) Take 2 capsules by mouth daily.    Marland Kitchen OVER THE COUNTER MEDICATION OTC Fiber Gummies  OR   Benefiber.    . pravastatin (PRAVACHOL) 40 MG tablet Take 40 mg by mouth as directed. Half  Tablet   Tuesday,  Thursday, and  Saturday  Only.    Marland Kitchen VITAMIN D, CHOLECALCIFEROL, PO Take 2,000 Int'l Units by mouth.     . Wheat Dextrin (BENEFIBER) POWD Take 2 scoop by mouth daily as needed.      No current facility-administered medications on file prior to visit.     Current Problems (verified) Patient Active Problem List   Diagnosis Date Noted  . Encounter for Medicare annual wellness exam 02/23/2016  . Essential hypertension 10/27/2013  . Medication management 04/23/2013  . PreDiabetes   . Vitamin D deficiency   . History of left breast cancer 11/03/2012  . Hypothyroidism 03/22/2008  . Hyperlipidemia 03/22/2008  .  G E R D 03/22/2008  . IBS 03/22/2008  . Vertigo 03/22/2008  . Thyroid Cancer,Hx 03/22/2008    Screening Tests Immunization History  Administered Date(s) Administered  . Influenza, High Dose Seasonal PF 01/05/2013, 01/05/2015, 11/27/2015  . Pneumococcal Conjugate-13 03/02/2015  . Pneumococcal Polysaccharide-23 03/18/2008  . Td 03/18/2005   Preventative care: Last colonoscopy: 2011 due 10 years Last mammogram: 12/2015, CAT B Last pap smear/pelvic exam: 2012 declines another DEXA: 12/2014, ostoepenia Echo 2010  Prior vaccinations: TD or Tdap: 2007  Influenza: 2017 Pneumococcal: 2010 Prevnar 13: DUE but prefers not to get today Shingles/Zostavax: declines due to cost  Allergies Allergies  Allergen Reactions  . Neomycin-Bacitracin Zn-Polymyx     REACTION: rash  . Propoxyphene Hcl     REACTION: syncope  . Darvon Other (See Comments)    Almost fainted when taking, over 50 years.    SURGICAL HISTORY She  has a past surgical history that includes Thyroid surgery (1986); Breast lumpectomy (2010); Colonoscopy; Cardiac catheterization (2006); Knee arthroscopy with medial menisectomy (Left, 05/25/2014); Knee arthroscopy with lateral menisectomy (Left, 05/25/2014); and Chondroplasty (Left, 05/25/2014). FAMILY HISTORY Her family history includes Cancer in her father; Cancer - Ovarian in her sister; Colon cancer in her father; Stroke in her father. SOCIAL HISTORY She  reports that she has never smoked. She has never used smokeless tobacco. She reports that she does not drink alcohol or use drugs.  MEDICARE WELLNESS OBJECTIVES: Physical activity: Current Exercise Habits: The patient does not participate in regular exercise at present Cardiac risk factors: Cardiac Risk Factors include: advanced age (>9men, >8 women);dyslipidemia;hypertension;sedentary lifestyle Depression/mood screen:   Depression screen [facility] 2/9 02/23/2016  Decreased Interest 0  Down, Depressed, Hopeless 0  PHQ - 2  Score 0    ADLs:  In your present state of health, do you have any difficulty performing the following activities: 02/23/2016 07/19/2015  Hearing? N N  Vision? N N  Difficulty concentrating or making decisions? N N  Walking or climbing stairs? N N  Dressing or bathing? N N  Doing errands, shopping? N N  Preparing Food and eating ? - -  Using the Toilet? - -  In the past six months, have you accidently leaked urine? - -  Do you have problems with loss of bowel control? - -  Managing your Medications? - -  Managing your Finances? - -  Housekeeping or managing your Housekeeping? - -  Some recent data might be hidden     Cognitive Testing  Alert? Yes  Normal Appearance?Yes  Oriented to person? Yes  Place? Yes   Time? Yes  Recall of three objects?  Yes  Can perform simple calculations? Yes  Displays appropriate judgment?Yes  Can read the correct time from a watch face?Yes  EOL planning: Does Patient Have a Medical Advance Directive?: Yes Type of Advance Directive: Living will,  [place] in Chart?: No - copy requested   Objective:   Blood pressure 132/80, pulse 64, temperature 97.7 F (36.5 C), resp. rate 16, height 5' 1.5" (1.562 m), weight 125 lb 3.2 oz (56.8 kg), SpO2 99 %. Body mass index is 23.27 kg/m.  General appearance: alert, no distress, WD/WN,  female HEENT: normocephalic, sclerae anicteric, TMs pearly, nares patent, no discharge or erythema, pharynx normal Oral cavity: MMM, no lesions Neck: supple, no lymphadenopathy, no thyromegaly, no masses Heart: RRR, normal S1, S2, no murmurs Lungs: CTA bilaterally, no wheezes, rhonchi, or rales Abdomen: +bs, soft, non tender, non distended, no masses, no hepatomegaly, no splenomegaly Musculoskeletal: nontender, no swelling, no obvious deformity. Patient is able to ambulate well.  Extremities: no edema, no cyanosis, no clubbing Pulses: 2+ symmetric, upper and lower  extremities, normal cap refill Neurological: alert, oriented x 3, CN2-12 intact, strength normal upper extremities and lower extremities, sensation normal throughout, DTRs 2+ throughout, no cerebellar signs, gait normal Psychiatric: normal affect, behavior normal, pleasant  Breast: defer Gyn: defer Rectal: defer  Medicare Attestation I have personally reviewed: The patient's medical and social history Their use of alcohol, tobacco or illicit drugs Their current medications and supplements The patient's functional ability including ADLs,fall risks, home safety risks, cognitive, and hearing and visual impairment Diet and physical activities Evidence for depression or mood disorders  The patient's weight, height, BMI, and visual acuity have been recorded in the chart.  I have made referrals, counseling, and provided education to the patient based on review of the above and I have provided the patient with a written personalized care plan for preventive services.     Vicie Mutters, PA-C   02/23/2016

## 2016-02-23 NOTE — Patient Instructions (Addendum)
You can take tylenol (500mg ) or tylenol arthritis (650mg ) with the meloxicam/antiinflammatories. The max you can take of tylenol a day is 3000mg  daily, this is a max of 6 pills a day of the regular tyelnol (500mg ) or a max of 4 a day of the tylenol arthritis (650mg ) as long as no other medications you are taking contain tylenol.    Knee Exercises Ask your health care provider which exercises are safe for you. Do exercises exactly as told by your health care provider and adjust them as directed. It is normal to feel mild stretching, pulling, tightness, or discomfort as you do these exercises, but you should stop right away if you feel sudden pain or your pain gets worse.Do not begin these exercises until told by your health care provider. STRETCHING AND RANGE OF MOTION EXERCISES  These exercises warm up your muscles and joints and improve the movement and flexibility of your knee. These exercises also help to relieve pain, numbness, and tingling. Exercise A: Knee Extension, Prone 1. Lie on your abdomen on a bed. 2. Place your left / right knee just beyond the edge of the surface so your knee is not on the bed. You can put a towel under your left / right thigh just above your knee for comfort. 3. Relax your leg muscles and allow gravity to straighten your knee. You should feel a stretch behind your left / right knee. 4. Hold this position for __________ seconds. 5. Scoot up so your knee is supported between repetitions. Repeat __________ times. Complete this stretch __________ times a day. Exercise B: Knee Flexion, Active  1. Lie on your back with both knees straight. If this causes back discomfort, bend your left / right knee so your foot is flat on the floor. 2. Slowly slide your left / right heel back toward your buttocks until you feel a gentle stretch in the front of your knee or thigh. 3. Hold this position for __________ seconds. 4. Slowly slide your left / right heel back to the starting  position. Repeat __________ times. Complete this exercise __________ times a day. Exercise C: Quadriceps, Prone  1. Lie on your abdomen on a firm surface, such as a bed or padded floor. 2. Bend your left / right knee and hold your ankle. If you cannot reach your ankle or pant leg, loop a belt around your foot and grab the belt instead. 3. Gently pull your heel toward your buttocks. Your knee should not slide out to the side. You should feel a stretch in the front of your thigh and knee. 4. Hold this position for __________ seconds. Repeat __________ times. Complete this stretch __________ times a day. Exercise D: Hamstring, Supine 1. Lie on your back. 2. Loop a belt or towel over the ball of your left / right foot. The ball of your foot is on the walking surface, right under your toes. 3. Straighten your left / right knee and slowly pull on the belt to raise your leg until you feel a gentle stretch behind your knee.  Do not let your left / right knee bend while you do this.  Keep your other leg flat on the floor. 4. Hold this position for __________ seconds. Repeat __________ times. Complete this stretch __________ times a day. STRENGTHENING EXERCISES  These exercises build strength and endurance in your knee. Endurance is the ability to use your muscles for a long time, even after they get tired. Exercise E: Quadriceps, Isometric  1.  Lie on your back with your left / right leg extended and your other knee bent. Put a rolled towel or small pillow under your knee if told by your health care provider. 2. Slowly tense the muscles in the front of your left / right thigh. You should see your kneecap slide up toward your hip or see increased dimpling just above the knee. This motion will push the back of the knee toward the floor. 3. For __________ seconds, keep the muscle as tight as you can without increasing your pain. 4. Relax the muscles slowly and completely. Repeat __________ times.  Complete this exercise __________ times a day. Exercise F: Straight Leg Raises - Quadriceps 1. Lie on your back with your left / right leg extended and your other knee bent. 2. Tense the muscles in the front of your left / right thigh. You should see your kneecap slide up or see increased dimpling just above the knee. Your thigh may even shake a bit. 3. Keep these muscles tight as you raise your leg 4-6 inches (10-15 cm) off the floor. Do not let your knee bend. 4. Hold this position for __________ seconds. 5. Keep these muscles tense as you lower your leg. 6. Relax your muscles slowly and completely after each repetition. Repeat __________ times. Complete this exercise __________ times a day. Exercise G: Hamstring, Isometric 1. Lie on your back on a firm surface. 2. Bend your left / right knee approximately __________ degrees. 3. Dig your left / right heel into the surface as if you are trying to pull it toward your buttocks. Tighten the muscles in the back of your thighs to dig as hard as you can without increasing any pain. 4. Hold this position for __________ seconds. 5. Release the tension gradually and allow your muscles to relax completely for __________ seconds after each repetition. Repeat __________ times. Complete this exercise __________ times a day. Exercise H: Hamstring Curls  If told by your health care provider, do this exercise while wearing ankle weights. Begin with __________ weights. Then increase the weight by 1 lb (0.5 kg) increments. Do not wear ankle weights that are more than __________. 1. Lie on your abdomen with your legs straight. 2. Bend your left / right knee as far as you can without feeling pain. Keep your hips flat against the floor. 3. Hold this position for __________ seconds. 4. Slowly lower your leg to the starting position. Repeat __________ times. Complete this exercise __________ times a day. Exercise I: Squats (Quadriceps) 1. Stand in front of a  table, with your feet and knees pointing straight ahead. You may rest your hands on the table for balance but not for support. 2. Slowly bend your knees and lower your hips like you are going to sit in a chair.  Keep your weight over your heels, not over your toes.  Keep your lower legs upright so they are parallel with the table legs.  Do not let your hips go lower than your knees.  Do not bend lower than told by your health care provider.  If your knee pain increases, do not bend as low. 3. Hold the squat position for __________ seconds. 4. Slowly push with your legs to return to standing. Do not use your hands to pull yourself to standing. Repeat __________ times. Complete this exercise __________ times a day. Exercise J: Wall Slides (Quadriceps)  1. Lean your back against a smooth wall or door while you walk your feet out  18-24 inches (46-61 cm) from it. 2. Place your feet hip-width apart. 3. Slowly slide down the wall or door until your knees bend __________ degrees. Keep your knees over your heels, not over your toes. Keep your knees in line with your hips. 4. Hold for __________ seconds. Repeat __________ times. Complete this exercise __________ times a day. Exercise K: Straight Leg Raises - Hip Abductors 1. Lie on your side with your left / right leg in the top position. Lie so your head, shoulder, knee, and hip line up. You may bend your bottom knee to help you keep your balance. 2. Roll your hips slightly forward so your hips are stacked directly over each other and your left / right knee is facing forward. 3. Leading with your heel, lift your top leg 4-6 inches (10-15 cm). You should feel the muscles in your outer hip lifting.  Do not let your foot drift forward.  Do not let your knee roll toward the ceiling. 4. Hold this position for __________ seconds. 5. Slowly return your leg to the starting position. 6. Let your muscles relax completely after each repetition. Repeat  __________ times. Complete this exercise __________ times a day. Exercise L: Straight Leg Raises - Hip Extensors 1. Lie on your abdomen on a firm surface. You can put a pillow under your hips if that is more comfortable. 2. Tense the muscles in your buttocks and lift your left / right leg about 4-6 inches (10-15 cm). Keep your knee straight as you lift your leg. 3. Hold this position for __________ seconds. 4. Slowly lower your leg to the starting position. 5. Let your leg relax completely after each repetition. Repeat __________ times. Complete this exercise __________ times a day. This information is not intended to replace advice given to you by your health care provider. Make sure you discuss any questions you have with your health care provider. Document Released: 01/16/2005 Document Revised: 11/27/2015 Document Reviewed: 01/08/2015 Elsevier Interactive Patient Education  2017 [employer].

## 2016-02-27 LAB — CBC WITH DIFFERENTIAL/PLATELET
BASOS ABS: 74 {cells}/uL (ref 0–200)
Basophils Relative: 1 %
EOS ABS: 74 {cells}/uL (ref 15–500)
EOS PCT: 1 %
HEMATOCRIT: 41.5 % (ref 35.0–45.0)
HEMOGLOBIN: 14 g/dL (ref 11.7–15.5)
Lymphocytes Relative: 25 %
Lymphs Abs: 1850 cells/uL (ref 850–3900)
MCH: 30.4 pg (ref 27.0–33.0)
MCHC: 33.7 g/dL (ref 32.0–36.0)
MCV: 90 fL (ref 80.0–100.0)
MPV: 9.6 fL (ref 7.5–12.5)
Monocytes Absolute: 666 cells/uL (ref 200–950)
Monocytes Relative: 9 %
NEUTROS PCT: 64 %
Neutro Abs: 4736 cells/uL (ref 1500–7800)
Platelets: 285 10*3/uL (ref 140–400)
RBC: 4.61 MIL/uL (ref 3.80–5.10)
RDW: 13 % (ref 11.0–15.0)
WBC: 7.4 10*3/uL (ref 3.8–10.8)

## 2016-02-27 LAB — BASIC METABOLIC PANEL WITH GFR
BUN: 12 mg/dL (ref 7–25)
CO2: 27 mmol/L (ref 20–31)
Calcium: 9.3 mg/dL (ref 8.6–10.4)
Chloride: 105 mmol/L (ref 98–110)
Creat: 0.88 mg/dL (ref 0.60–0.93)
GFR, EST AFRICAN AMERICAN: 74 mL/min (ref >=60)
GFR, Est Non African American: 64 mL/min (ref >=60)
GLUCOSE: 79 mg/dL (ref 65–99)
POTASSIUM: 4.4 mmol/L (ref 3.5–5.3)
Sodium: 140 mmol/L (ref 135–146)

## 2016-02-27 LAB — LIPID PANEL
CHOL/HDL RATIO: 3.1 ratio (ref <5.0)
CHOLESTEROL: 174 mg/dL (ref <200)
HDL: 56 mg/dL (ref >50)
LDL CALC: 103 mg/dL — AB (ref <100)
Triglycerides: 76 mg/dL (ref <150)
VLDL: 15 mg/dL (ref <30)

## 2016-02-27 LAB — HEPATIC FUNCTION PANEL
ALT: 8 U/L (ref 6–29)
AST: 15 U/L (ref 10–35)
Albumin: 4.5 g/dL (ref 3.6–5.1)
Alkaline Phosphatase: 68 U/L (ref 33–130)
BILIRUBIN DIRECT: 0.1 mg/dL (ref <=0.2)
BILIRUBIN INDIRECT: 0.4 mg/dL (ref 0.2–1.2)
BILIRUBIN TOTAL: 0.5 mg/dL (ref 0.2–1.2)
Total Protein: 6.8 g/dL (ref 6.1–8.1)

## 2016-02-27 LAB — HEMOGLOBIN A1C
HEMOGLOBIN A1C: 5 % (ref <5.7)
MEAN PLASMA GLUCOSE: 97 mg/dL

## 2016-02-27 LAB — VITAMIN D 25 HYDROXY (VIT D DEFICIENCY, FRACTURES): VIT D 25 HYDROXY: 71 ng/mL (ref 30–100)

## 2016-02-27 LAB — TSH: TSH: 1.49 m[IU]/L

## 2016-02-27 LAB — MAGNESIUM: MAGNESIUM: 2.1 mg/dL (ref 1.5–2.5)

## 2016-02-28 ENCOUNTER — Encounter: Payer: Self-pay | Admitting: Physician Assistant

## 2016-02-29 MED ORDER — ATORVASTATIN CALCIUM 80 MG PO TABS: ORAL_TABLET | ORAL | 3 refills | Status: DC

## 2016-03-19 ENCOUNTER — Encounter: Payer: Self-pay | Admitting: Physician Assistant

## 2016-03-19 ENCOUNTER — Ambulatory Visit (INDEPENDENT_AMBULATORY_CARE_PROVIDER_SITE_OTHER): Payer: PPO | Admitting: Physician Assistant

## 2016-03-19 VITALS — BP 120/70 | HR 80 | Temp 97.9°F | Resp 16 | Ht 61.5 in | Wt 122.2 lb

## 2016-03-19 DIAGNOSIS — J0100 Acute maxillary sinusitis, unspecified: Secondary | ICD-10-CM | POA: Diagnosis not present

## 2016-03-19 MED ORDER — AZITHROMYCIN 250 MG PO TABS: ORAL_TABLET | ORAL | 1 refills | Status: AC

## 2016-03-19 MED ORDER — PREDNISONE 20 MG PO TABS: ORAL_TABLET | ORAL | 0 refills | Status: DC

## 2016-03-19 NOTE — Progress Notes (Signed)
Subjective:    Patient ID: Haley Shah, female    DOB: [birth year], 76 y.o.   MRN: [id number]  HPI 76 y.o. WF presents with sinus pain x 5-7 days, sore throat is the worse, with non productive, no fever chills. She has been taking coricidin x 3 days. She is not on an allergy pill.   Blood pressure 120/70, pulse 80, temperature 97.9 F (36.6 C), resp. rate 16, height 5' 1.5" (1.562 m), weight 122 lb 3.2 oz (55.4 kg), SpO2 97 %.  Medications Current Outpatient Prescriptions on File Prior to Visit  Medication Sig  . aspirin 81 MG tablet Take 81 mg by mouth daily.  Marland Kitchen atorvastatin (LIPITOR) 80 MG tablet TAKE 1/2 to whole tablet DAILY OR AS DIRECTED FOR CHOLESTEROL  . latanoprost (XALATAN) 0.005 % ophthalmic solution Place 1 drop into both eyes at bedtime.  Marland Kitchen levothyroxine (SYNTHROID, LEVOTHROID) 100 MCG tablet Take 1 tablet (100 mcg total) by mouth daily.  . meclizine (ANTIVERT) 25 MG tablet Take 1 tablet (25 mg total) by mouth 3 (three) times daily as needed for dizziness.  . meloxicam (MOBIC) 15 MG tablet Take 1 tablet (15 mg total) by mouth as needed for pain.  . Multiple Vitamins-Minerals (MULTIVITAMIN PO) Take by mouth daily.  . Omega-3 Fatty Acids (FISH OIL PO) Take 2 capsules by mouth daily.  Marland Kitchen OVER THE COUNTER MEDICATION OTC Fiber Gummies  OR   Benefiber.  Marland Kitchen VITAMIN D, CHOLECALCIFEROL, PO Take 2,000 Int'l Units by mouth.   . Wheat Dextrin (BENEFIBER) POWD Take 2 scoop by mouth daily as needed.    No current facility-administered medications on file prior to visit.     Problem list She has Hypothyroidism; Hyperlipidemia; G E R D; IBS; Vertigo; Thyroid Cancer,Hx; History of left breast cancer; PreDiabetes; Vitamin D deficiency; Medication management; Essential hypertension; and Encounter for Medicare annual wellness exam on her problem list.  Review of Systems  Constitutional: Positive for chills. Negative for diaphoresis and fever.  HENT: Positive for congestion, sinus  pressure and sore throat. Negative for ear pain and sneezing.   Respiratory: Negative.  Negative for cough and shortness of breath.   Cardiovascular: Negative.   Musculoskeletal: Positive for neck pain.  Neurological: Negative.  Negative for headaches.       Objective:   Physical Exam  Constitutional: She is oriented to person, place, and time. She appears well-developed and well-nourished.  HENT:  Right Ear: Hearing and external ear normal. No mastoid tenderness. Tympanic membrane is injected. Tympanic membrane is not perforated, not erythematous, not retracted and not bulging. A middle ear effusion is present.  Left Ear: Hearing and external ear normal. No mastoid tenderness. Tympanic membrane is injected. Tympanic membrane is not perforated, not erythematous, not retracted and not bulging. A middle ear effusion is present.  Nose: Right sinus exhibits maxillary sinus tenderness. Left sinus exhibits maxillary sinus tenderness.  Mouth/Throat: Uvula is midline and mucous membranes are normal. No trismus in the jaw. Posterior oropharyngeal edema and posterior oropharyngeal erythema present. No oropharyngeal exudate or tonsillar abscesses.  Eyes: Conjunctivae and EOM are normal. Pupils are equal, round, and reactive to light.  Neck: Neck supple.  Cardiovascular: Normal rate and regular rhythm.   Pulmonary/Chest: Effort normal and breath sounds normal. No respiratory distress. She has no wheezes.  Abdominal: Soft. Bowel sounds are normal.  Musculoskeletal: Normal range of motion.  Lymphadenopathy:    She has cervical adenopathy.  Neurological: She is alert and oriented to person, place, and  time.  Skin: Skin is warm and dry.      Assessment & Plan:  Acute maxillary sinusitis, recurrence not specified -     azithromycin (ZITHROMAX) 250 MG tablet; Take 2 tablets (500 mg) on  Day 1,  followed by 1 tablet (250 mg) once daily on Days 2 through 5. -     predniSONE (DELTASONE) 20 MG tablet; 2  tablets daily for 3 days, 1 tablet daily for 4 days.   The patient was advised to call immediately if she has any concerning symptoms in the interval. The patient voices understanding of current treatment options and is in agreement with the current care plan.The patient knows to call the clinic with any problems, questions or concerns or go to the ER if any further progression of symptoms.

## 2016-03-19 NOTE — Patient Instructions (Signed)

## 2016-03-22 ENCOUNTER — Encounter: Payer: Self-pay | Admitting: *Deleted

## 2016-03-22 IMAGING — MR MR KNEE*L* W/O CM
4 of 6 series · 18 of 40 positions shown · non-contrast
Comparison: None.

CLINICAL DATA: Medial left knee pain since 02/24/2014. No known
injury. History of breast cancer.

EXAM:
MRI OF THE LEFT KNEE WITHOUT CONTRAST
TECHNIQUE: Multiplanar, multisequence MR imaging of the knee was performed. No
intravenous contrast was administered.

[Series 3: PD fat-sat · axial · 4.0mm · 0.29mm/px · z∈[-27,+83]mm · 7 of 23 slices shown (1 of 4)]
[im 1/23]
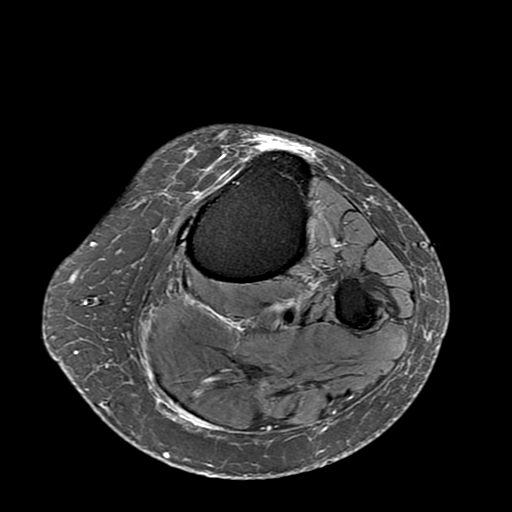
[im 4/23]
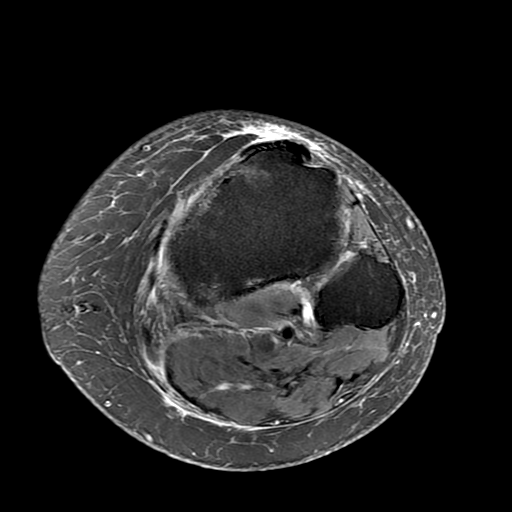
[im 8/23]
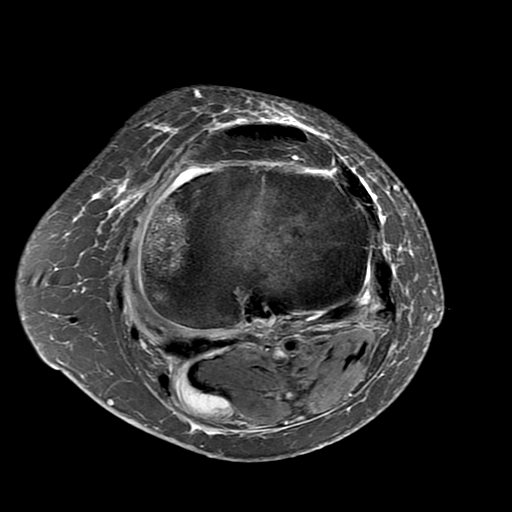
[im 12/23]
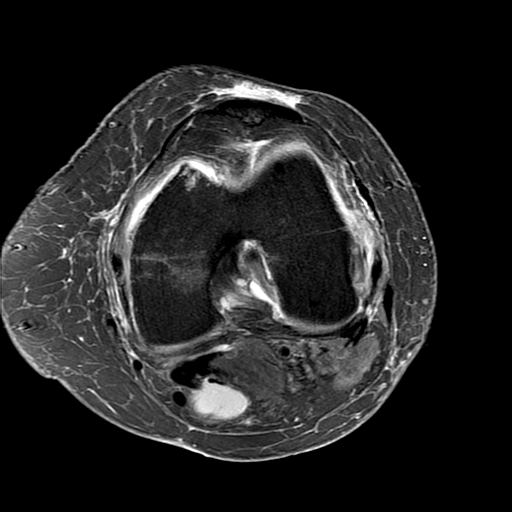
[im 15/23]
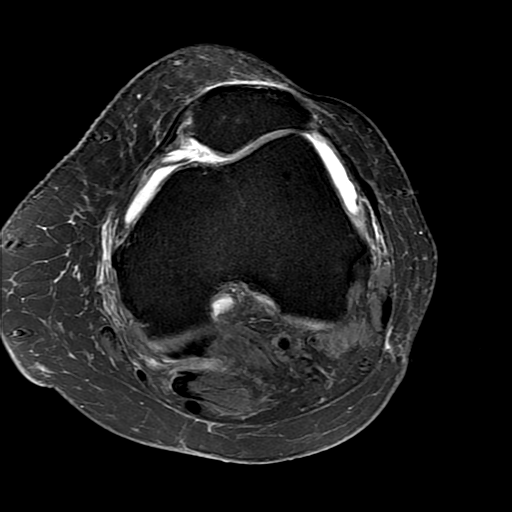
[im 19/23]
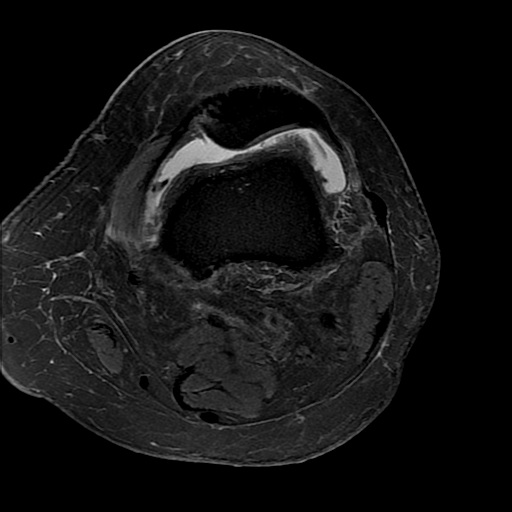
[im 23/23]
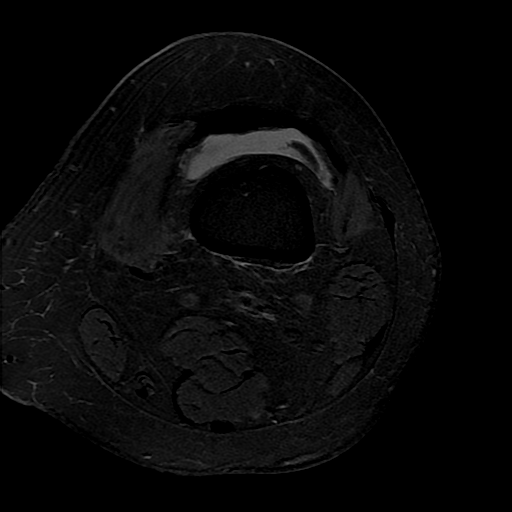

[Series 4: PD fat-sat · sagittal · 3.0mm · 0.29mm/px · 5 of 27 slices shown (2 of 4)]
[im 1/27]
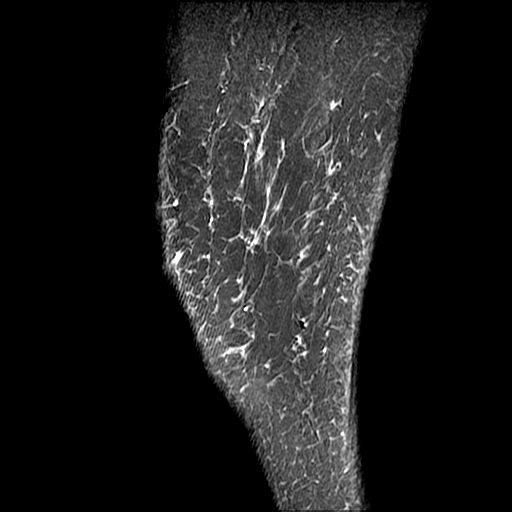
[im 4/27]
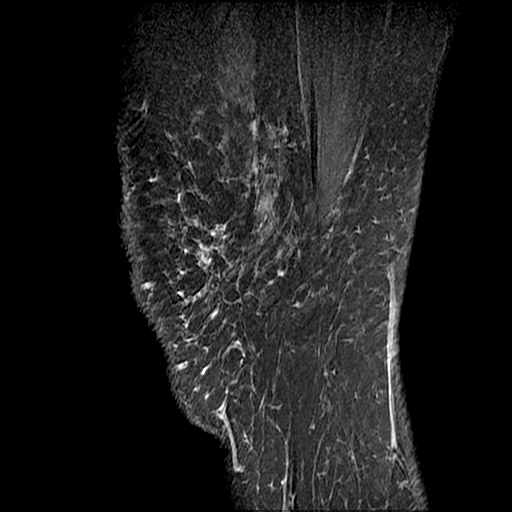
[im 8/27]
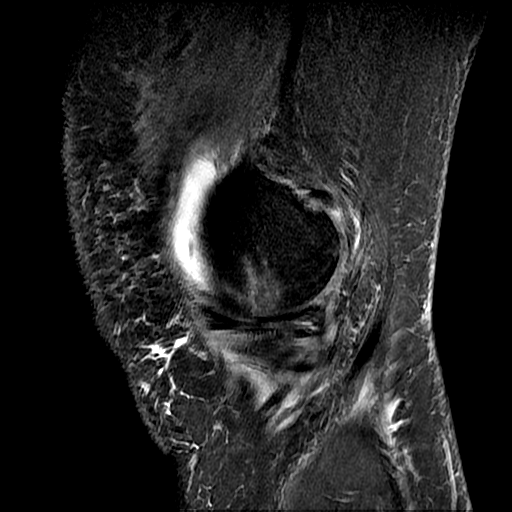
[im 15/27]
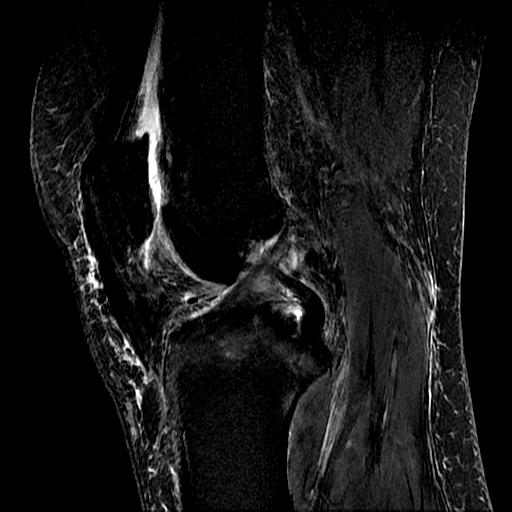
[im 23/27]
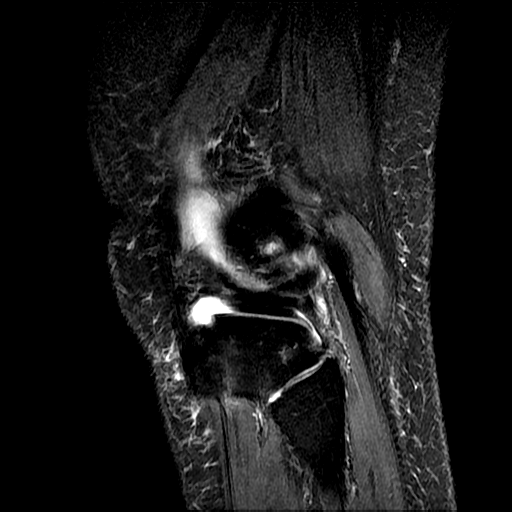

[Series 7: PD fat-sat · coronal · 4.0mm · 0.31mm/px · 3 of 24 slices shown (3 of 4)]
[im 4/24]
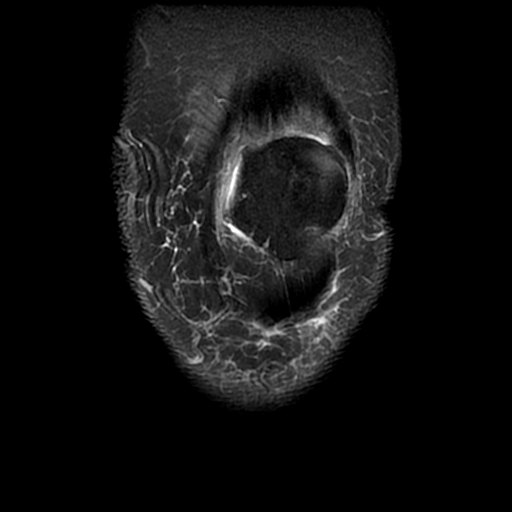
[im 12/24]
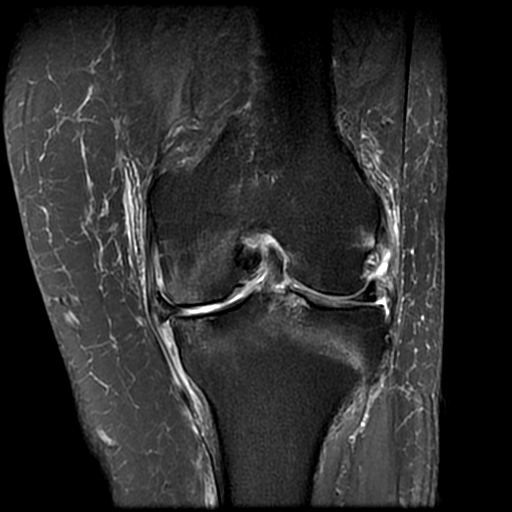
[im 20/24]
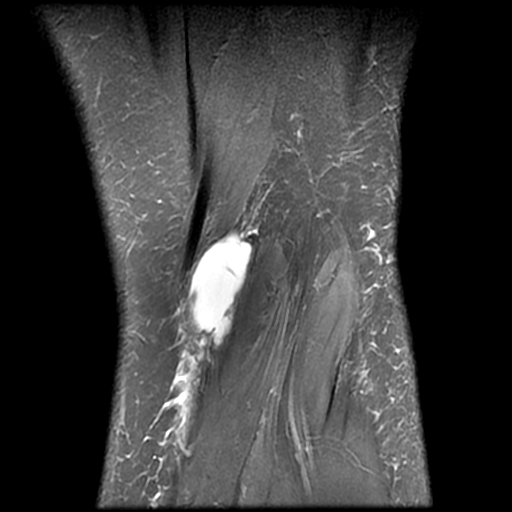

[Series 8: PD fat-sat · oblique · 2.0mm · 0.31mm/px · 3 of 13 slices shown (4 of 4)]
[im 1/13]
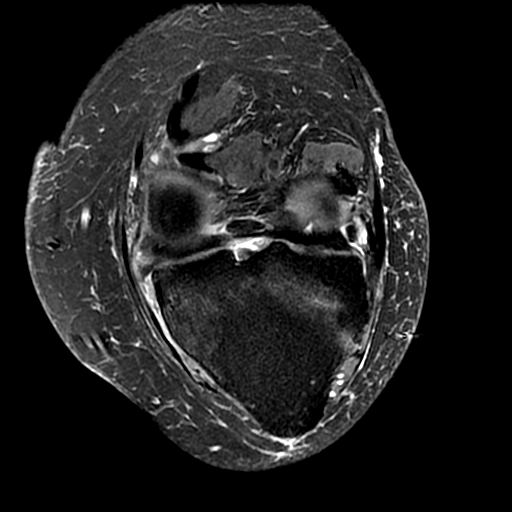
[im 9/13]
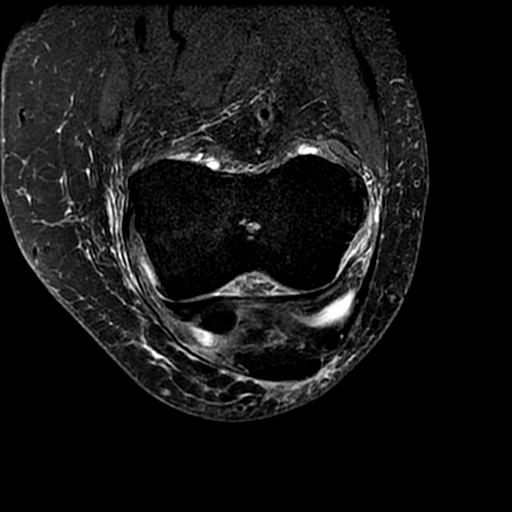
[im 13/13]
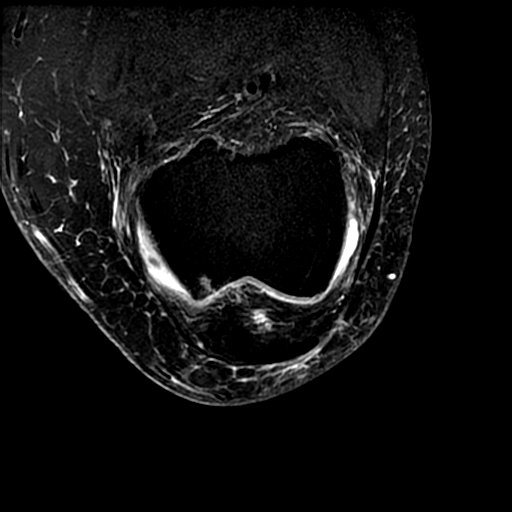

[18 of 40 positions shown; findings below may reference images not displayed]

FINDINGS: MENISCI

Medial meniscus: The patient has a complete radial tear through the
root of the posterior horn of the medial meniscus. The body of
medial meniscus is extruded peripherally. Degenerative signal is
seen throughout the posterior horn and body of the medial meniscus.

Lateral meniscus:  Intact.

LIGAMENTS

Cruciates:  Intact.

Collaterals:  Intact.

CARTILAGE

Patellofemoral: Hyaline cartilage along the medial patellar facet is
completely denuded. No associated reactive marrow signal change is
identified.

Medial: Hyaline cartilage about the medial compartment appears
completely denuded. There is subchondral edema present in the distal
femur and proximal tibia.

Lateral:  Hyaline cartilage is thinned.

Joint:  Small joint effusion is present.

Popliteal Fossa: Baker's cyst measuring 1.9 cm transverse by 1.1 cm
AP by 3.7 cm craniocaudal is identified.

Extensor Mechanism:  Intact.

Bones: Marrow edema is seen in the lateral tibial metaphysis
compatible with stress change. Small subchondral cysts are seen in
the anterior aspect of the medial tibial plateau and small
osteophytes are seen about the knee. There is no fracture or
evidence metastatic disease.
IMPRESSION: Complete radial tear through the root of the posterior horn of the
medial meniscus results in extrusion of the body of the medial
meniscus peripherally. Extensive degenerative signal is seen in the
posterior horn and body.

Tricompartmental osteoarthritis appears advanced in the
patellofemoral and medial compartments.

Marrow edema about the medial compartment and lateral tibial
metaphysis is compatible with signal abnormality related to
degenerative disease and stress change. There is no fracture or
evidence of metastatic disease.

Small Baker's cyst.

## 2016-03-25 ENCOUNTER — Encounter: Payer: Self-pay | Admitting: Physician Assistant

## 2016-05-30 ENCOUNTER — Encounter: Payer: Self-pay | Admitting: Internal Medicine

## 2016-06-13 ENCOUNTER — Encounter: Payer: Self-pay | Admitting: Internal Medicine

## 2016-06-13 ENCOUNTER — Ambulatory Visit (INDEPENDENT_AMBULATORY_CARE_PROVIDER_SITE_OTHER): Payer: PPO | Admitting: Internal Medicine

## 2016-06-13 ENCOUNTER — Other Ambulatory Visit: Payer: Self-pay | Admitting: Internal Medicine

## 2016-06-13 VITALS — BP 140/78 | HR 60 | Temp 97.5°F | Resp 16 | Ht 61.5 in | Wt 122.2 lb

## 2016-06-13 DIAGNOSIS — Z79899 Other long term (current) drug therapy: Secondary | ICD-10-CM

## 2016-06-13 DIAGNOSIS — E782 Mixed hyperlipidemia: Secondary | ICD-10-CM | POA: Diagnosis not present

## 2016-06-13 DIAGNOSIS — I10 Essential (primary) hypertension: Secondary | ICD-10-CM

## 2016-06-13 DIAGNOSIS — R7303 Prediabetes: Secondary | ICD-10-CM | POA: Diagnosis not present

## 2016-06-13 DIAGNOSIS — E559 Vitamin D deficiency, unspecified: Secondary | ICD-10-CM | POA: Diagnosis not present

## 2016-06-13 DIAGNOSIS — E039 Hypothyroidism, unspecified: Secondary | ICD-10-CM | POA: Diagnosis not present

## 2016-06-13 DIAGNOSIS — K219 Gastro-esophageal reflux disease without esophagitis: Secondary | ICD-10-CM

## 2016-06-13 LAB — BASIC METABOLIC PANEL WITH GFR
BUN: 13 mg/dL (ref 7–25)
CALCIUM: 9.4 mg/dL (ref 8.6–10.4)
CO2: 23 mmol/L (ref 20–31)
CREATININE: 0.82 mg/dL (ref 0.60–0.93)
Chloride: 105 mmol/L (ref 98–110)
GFR, EST NON AFRICAN AMERICAN: 69 mL/min (ref >=60)
GFR, Est African American: 80 mL/min (ref >=60)
Glucose, Bld: 81 mg/dL (ref 65–99)
Potassium: 4.2 mmol/L (ref 3.5–5.3)
SODIUM: 143 mmol/L (ref 135–146)

## 2016-06-13 LAB — HEPATIC FUNCTION PANEL
ALT: 8 U/L (ref 6–29)
AST: 15 U/L (ref 10–35)
Albumin: 4.2 g/dL (ref 3.6–5.1)
Alkaline Phosphatase: 75 U/L (ref 33–130)
BILIRUBIN DIRECT: 0.1 mg/dL (ref <=0.2)
BILIRUBIN INDIRECT: 0.4 mg/dL (ref 0.2–1.2)
BILIRUBIN TOTAL: 0.5 mg/dL (ref 0.2–1.2)
Total Protein: 6.9 g/dL (ref 6.1–8.1)

## 2016-06-13 LAB — CBC WITH DIFFERENTIAL/PLATELET
BASOS ABS: 68 {cells}/uL (ref 0–200)
Basophils Relative: 1 %
EOS PCT: 3 %
Eosinophils Absolute: 204 cells/uL (ref 15–500)
HCT: 41.5 % (ref 35.0–45.0)
Hemoglobin: 13.7 g/dL (ref 11.7–15.5)
LYMPHS ABS: 1836 {cells}/uL (ref 850–3900)
Lymphocytes Relative: 27 %
MCH: 29.8 pg (ref 27.0–33.0)
MCHC: 33 g/dL (ref 32.0–36.0)
MCV: 90.2 fL (ref 80.0–100.0)
MONOS PCT: 11 %
MPV: 8.8 fL (ref 7.5–12.5)
Monocytes Absolute: 748 cells/uL (ref 200–950)
NEUTROS PCT: 58 %
Neutro Abs: 3944 cells/uL (ref 1500–7800)
Platelets: 253 10*3/uL (ref 140–400)
RBC: 4.6 MIL/uL (ref 3.80–5.10)
RDW: 14.4 % (ref 11.0–15.0)
WBC: 6.8 10*3/uL (ref 3.8–10.8)

## 2016-06-13 LAB — LIPID PANEL
CHOL/HDL RATIO: 3.5 ratio (ref <5.0)
CHOLESTEROL: 176 mg/dL (ref <200)
HDL: 50 mg/dL — ABNORMAL LOW (ref >50)
LDL Cholesterol: 104 mg/dL — ABNORMAL HIGH (ref <100)
TRIGLYCERIDES: 112 mg/dL (ref <150)
VLDL: 22 mg/dL (ref <30)

## 2016-06-13 LAB — TSH: TSH: 0.95 mIU/L

## 2016-06-13 NOTE — Progress Notes (Signed)
This very nice 76 y.o. MWF presents for 6 month follow up with Hypertension, Hyperlipidemia, Pre-Diabetes, Hypothyroidism and Vitamin D Deficiency.  In 2010, patient lad a L Breast Lumpectomy for Ca and was treated with PO radiation and Arimidex for 5 years. Patient also has hx/o GERD controlled with prudent diet.      Patient is followed expectantly  for labile HTN since 2013  & BP has been controlled at home. Today's BP is borderline elevated at 140/78. Patient has had no complaints of any cardiac type chest pain, palpitations, dyspnea/orthopnea/PND, dizziness, claudication, or dependent edema.     Hyperlipidemia is controlled with diet & meds. Patient denies myalgias or other med SE's. Last Lipids were at goal: Lab Results  Component Value Date   CHOL 174 02/23/2016   HDL 56 02/23/2016   LDLCALC 103 (H) 02/23/2016   TRIG 76 02/23/2016   CHOLHDL 3.1 02/23/2016      Also, the patient has history of PreDiabetes and has had no symptoms of reactive hypoglycemia, diabetic polys, paresthesias or visual blurring.  Last A1c was at goal: Lab Results  Component Value Date   HGBA1C 5.0 02/23/2016      In 1988, patient had a partial thyroidectomy for cancer  and has been on suppressive replacement since. Further, the patient also has history of Vitamin D Deficiency and supplements vitamin D without any suspected side-effects. Last vitamin D was at goal:   Lab Results  Component Value Date   VD25OH 71 02/23/2016   Current Outpatient Prescriptions on File Prior to Visit  Medication Sig  . aspirin 81 MG tablet Take  daily.  Marland Kitchen atorvastatin  80 MG tablet TAKE 1/2-1 tablet DAILY   . XALATAN ophth  soln Place 1 drop into eyes at bedtime.  Marland Kitchen levothyroxine  100 MCG tablet Take 1 tab daily.  . meclizine 25 MG tablet Take 1 tab 3 x daily as needed  . Multi-Vit-Min Take  daily.  . Omega-3 FISH OIL Take 2 cap daily.  . Fiber Gummies or  Benefiber.   Marland Kitchen VITAMIN D Take 2,000 Int'l Units     Allergies   Allergen Reactions  . Neomycin-Bacitracin Zn-Polymyx     REACTION: rash  . Propoxyphene Hcl     REACTION: syncope  . Darvon Other (See Comments)    Almost fainted when taking, over 50 years.   PMHx:   Past Medical History:  Diagnosis Date  . Allergy   . Cancer [facility]) 2010   Left Breast  . Dyspnea    chronic-no cardiac reason  . GERD (gastroesophageal reflux disease)   . Glaucoma   . History of thyroid cancer   . Hyperlipidemia   . Hypertension    no meds  . IBS (irritable bowel syndrome)   . Pre-diabetes   . Sciatica    RLE  . Thyroid disease    Hypo  . Vitamin D deficiency   . Wears glasses    Immunization History  Administered Date(s) Administered  . Influenza, High Dose Seasonal PF 01/05/2013, 01/05/2015, 11/27/2015  . Pneumococcal Conjugate-13 03/02/2015  . Pneumococcal Polysaccharide-23 03/18/2008  . Td 03/18/2005   Past Surgical History:  Procedure Laterality Date  . BREAST LUMPECTOMY  2010   LEFT BREAST-snbx  . CARDIAC CATHETERIZATION  2006   normal coronary arteries  . CHONDROPLASTY Left 05/25/2014   Procedure: CHONDROPLASTY;  Surgeon: Dorna Leitz, MD;  Location: [place];  Service: Orthopedics;  Laterality: Left;  . COLONOSCOPY    .  KNEE ARTHROSCOPY WITH LATERAL MENISECTOMY Left 05/25/2014   Procedure: KNEE ARTHROSCOPY WITH LATERAL MENISECTOMY;  Surgeon: Dorna Leitz, MD;  Location: [place];  Service: Orthopedics;  Laterality: Left;  . KNEE ARTHROSCOPY WITH MEDIAL MENISECTOMY Left 05/25/2014   Procedure: LEFT ARTHROSCOPY KNEE medial and lateral meniscectomy,femoral patella chondroplasty;  Surgeon: Dorna Leitz, MD;  Location: [place];  Service: Orthopedics;  Laterality: Left;  . THYROID SURGERY  1986   partial thyroidectomy   FHx:    Reviewed / unchanged  SHx:    Reviewed / unchanged  Systems Review:  Constitutional: Denies fever, chills, wt changes, headaches, insomnia, fatigue, night sweats, change in  appetite. Eyes: Denies redness, blurred vision, diplopia, discharge, itchy, watery eyes.  ENT: Denies discharge, congestion, post nasal drip, epistaxis, sore throat, earache, hearing loss, dental pain, tinnitus, vertigo, sinus pain, snoring.  CV: Denies chest pain, palpitations, irregular heartbeat, syncope, dyspnea, diaphoresis, orthopnea, PND, claudication or edema. Respiratory: denies cough, dyspnea, DOE, pleurisy, hoarseness, laryngitis, wheezing.  Gastrointestinal: Denies dysphagia, odynophagia, heartburn, reflux, water brash, abdominal pain or cramps, nausea, vomiting, bloating, diarrhea, constipation, hematemesis, melena, hematochezia  or hemorrhoids. Genitourinary: Denies dysuria, frequency, urgency, nocturia, hesitancy, discharge, hematuria or flank pain. Musculoskeletal: Denies arthralgias, myalgias, stiffness, jt. swelling, pain, limping or strain/sprain.  Skin: Denies pruritus, rash, hives, warts, acne, eczema or change in skin lesion(s). Neuro: No weakness, tremor, incoordination, spasms, paresthesia or pain. Psychiatric: Denies confusion, memory loss or sensory loss. Endo: Denies change in weight, skin or hair change.  Heme/Lymph: No excessive bleeding, bruising or enlarged lymph nodes.  Physical Exam  BP 140/78   Pulse 60   Temp 97.5 F (36.4 C)   Resp 16   Ht 5' 1.5" (1.562 m)   Wt 122 lb 3.2 oz (55.4 kg)   BMI 22.72 kg/m   Appears well nourished, well groomed  and in no distress.  Eyes: PERRLA, EOMs, conjunctiva no swelling or erythema. Sinuses: No frontal/maxillary tenderness ENT/Mouth: EAC's clear, TM's nl w/o erythema, bulging. Nares clear w/o erythema, swelling, exudates. Oropharynx clear without erythema or exudates. Oral hygiene is good. Tongue normal, non obstructing. Hearing intact.  Neck: Supple. Thyroid nl. Car 2+/2+ without bruits, nodes or JVD. Chest: Respirations nl with BS clear & equal w/o rales, rhonchi, wheezing or stridor.  Cor: Heart sounds normal  w/ regular rate and rhythm without sig. murmurs, gallops, clicks or rubs. Peripheral pulses normal and equal  without edema.  Abdomen: Soft & bowel sounds normal. Non-tender w/o guarding, rebound, hernias, masses or organomegaly.  Lymphatics: Unremarkable.  Musculoskeletal: Full ROM all peripheral extremities, joint stability, 5/5 strength and normal gait.  Skin: Warm, dry without exposed rashes, lesions or ecchymosis apparent.  Neuro: Cranial nerves intact, reflexes equal bilaterally. Sensory-motor testing grossly intact. Tendon reflexes grossly intact.  Pysch: Alert & oriented x 3.  Insight and judgement nl & appropriate. No ideations.  Assessment and Plan:   1. Essential hypertension  - Continue medication, monitor blood pressure at home.  - Continue DASH diet. Reminder to go to the ER if any CP,  SOB, nausea, dizziness, severe HA, changes vision/speech,  left arm numbness and tingling and jaw pain.  - CBC with Differential/Platelet - BASIC METABOLIC PANEL WITH GFR - Magnesium - TSH  2. Mixed hyperlipidemia  - Continue diet/meds, exercise,& lifestyle modifications.  - Continue monitor periodic cholesterol/liver & renal functions   - Hepatic function panel - Lipid panel - TSH  3. PreDiabetes  - Continue diet, exercise, lifestyle modifications.  -  Monitor appropriate labs. - Hemoglobin A1c - Insulin, random  4. Vitamin D deficiency  - Continue supplementation.  - VITAMIN D 25 Hydroxy   5. Hypothyroidism  - TSH  6. Gastroesophageal reflux disease, hx   7. Medication management  - CBC with Differential/Platelet - BASIC METABOLIC PANEL WITH GFR - Hepatic function panel - Magnesium - Lipid panel - TSH - Hemoglobin A1c - Insulin, random - VITAMIN D 25 Hydroxy       Discussed  regular exercise, BP monitoring, weight control to achieve/maintain BMI less than 25 and discussed med and SE's. Recommended labs to assess and monitor clinical status with further  disposition pending results of labs. Over 30 minutes of exam, counseling, chart review was performed.

## 2016-06-13 NOTE — Patient Instructions (Signed)

## 2016-06-14 ENCOUNTER — Encounter: Payer: Self-pay | Admitting: Internal Medicine

## 2016-06-14 LAB — HEMOGLOBIN A1C
HEMOGLOBIN A1C: 5 % (ref <5.7)
Mean Plasma Glucose: 97 mg/dL

## 2016-06-14 LAB — MAGNESIUM: MAGNESIUM: 2 mg/dL (ref 1.5–2.5)

## 2016-06-14 LAB — INSULIN, RANDOM: Insulin: 3.8 u[IU]/mL (ref 2.0–19.6)

## 2016-06-14 LAB — VITAMIN D 25 HYDROXY (VIT D DEFICIENCY, FRACTURES): Vit D, 25-Hydroxy: 62 ng/mL (ref 30–100)

## 2016-07-02 DIAGNOSIS — H401111 Primary open-angle glaucoma, right eye, mild stage: Secondary | ICD-10-CM | POA: Diagnosis not present

## 2016-07-02 DIAGNOSIS — H02834 Dermatochalasis of left upper eyelid: Secondary | ICD-10-CM | POA: Diagnosis not present

## 2016-07-02 DIAGNOSIS — H43393 Other vitreous opacities, bilateral: Secondary | ICD-10-CM | POA: Diagnosis not present

## 2016-07-02 DIAGNOSIS — H02831 Dermatochalasis of right upper eyelid: Secondary | ICD-10-CM | POA: Diagnosis not present

## 2016-07-02 DIAGNOSIS — H401122 Primary open-angle glaucoma, left eye, moderate stage: Secondary | ICD-10-CM | POA: Diagnosis not present

## 2016-07-02 DIAGNOSIS — Z961 Presence of intraocular lens: Secondary | ICD-10-CM | POA: Diagnosis not present

## 2016-07-02 DIAGNOSIS — H25811 Combined forms of age-related cataract, right eye: Secondary | ICD-10-CM | POA: Diagnosis not present

## 2016-07-30 ENCOUNTER — Encounter: Payer: Self-pay | Admitting: Internal Medicine

## 2016-07-30 ENCOUNTER — Ambulatory Visit (INDEPENDENT_AMBULATORY_CARE_PROVIDER_SITE_OTHER): Payer: PPO | Admitting: Internal Medicine

## 2016-07-30 VITALS — BP 136/62 | HR 68 | Temp 97.8°F | Resp 16 | Ht 61.5 in | Wt 124.0 lb

## 2016-07-30 DIAGNOSIS — N6320 Unspecified lump in the left breast, unspecified quadrant: Secondary | ICD-10-CM | POA: Diagnosis not present

## 2016-07-30 DIAGNOSIS — N6325 Unspecified lump in the left breast, overlapping quadrants: Secondary | ICD-10-CM

## 2016-07-30 NOTE — Progress Notes (Signed)
Subjective:    Patient ID: Haley Shah, female    DOB: Nov 06, 1940, 76 y.o.   MRN: [id number]  HPI   This very nice 76 yo MWF s/p L partial Mastectomy for Breast Cancer in 2010 presents with c/o a sensation of fullness in her lateral Lt breast area below the axillary tail of [place]. Of note she had a negative digital MGM in Oct 2017.   Medication Sig  . aspirin 81 MG tablet Take 81 mg by mouth daily.  Marland Kitchen atorvastatin (LIPITOR) 80 MG tablet TAKE 1/2 to whole tablet DAILY OR AS DIRECTED FOR CHOLESTEROL  . latanoprost (XALATAN) 0.005 % ophthalmic solution Place 1 drop into both eyes at bedtime.  Marland Kitchen levothyroxine (SYNTHROID, LEVOTHROID) 100 MCG tablet Take 1 tablet (100 mcg total) by mouth daily.  . meclizine (ANTIVERT) 25 MG tablet Take 1 tablet (25 mg total) by mouth 3 (three) times daily as needed for dizziness.  . Multiple Vitamins-Minerals (MULTIVITAMIN PO) Take by mouth daily.  . Omega-3 Fatty Acids (FISH OIL PO) Take 2 capsules by mouth daily.  Marland Kitchen OVER THE COUNTER MEDICATION OTC Fiber Gummies  OR   Benefiber.  Marland Kitchen VITAMIN D, CHOLECALCIFEROL, PO Take 2,000 Int'l Units by mouth.    Allergies  Allergen Reactions  . Neomycin-Bacitracin Zn-Polymyx     REACTION: rash  . Propoxyphene Hcl     REACTION: syncope  . Darvon Other (See Comments)    Almost fainted when taking, over 50 years.   Past Medical History:  Diagnosis Date  . Allergy   . Cancer [facility]) 2010   Left Breast  . Dyspnea    chronic-no cardiac reason  . GERD (gastroesophageal reflux disease)   . Glaucoma   . History of thyroid cancer   . Hyperlipidemia   . Hypertension    no meds  . IBS (irritable bowel syndrome)   . Pre-diabetes   . Sciatica    RLE  . Thyroid disease    Hypo  . Vitamin D deficiency   . Wears glasses    Past Surgical History:  Procedure Laterality Date  . BREAST LUMPECTOMY  2010   LEFT BREAST-snbx  . CARDIAC CATHETERIZATION  2006   normal coronary arteries  . CHONDROPLASTY Left 05/25/2014   Procedure: CHONDROPLASTY;  Surgeon: Dorna Leitz, MD;  Location: [place];  Service: Orthopedics;  Laterality: Left;  . COLONOSCOPY    . KNEE ARTHROSCOPY WITH LATERAL MENISECTOMY Left 05/25/2014   Procedure: KNEE ARTHROSCOPY WITH LATERAL MENISECTOMY;  Surgeon: Dorna Leitz, MD;  Location: [place];  Service: Orthopedics;  Laterality: Left;  . KNEE ARTHROSCOPY WITH MEDIAL MENISECTOMY Left 05/25/2014   Procedure: LEFT ARTHROSCOPY KNEE medial and lateral meniscectomy,femoral patella chondroplasty;  Surgeon: Dorna Leitz, MD;  Location: [place];  Service: Orthopedics;  Laterality: Left;  . THYROID SURGERY  1986   partial thyroidectomy   Review of Systems      10 point systems review negative except as above.    Objective:   Physical Exam  BP 136/62   Pulse 68   Temp 97.8 F (36.6 C)   Resp 16   Ht 5' 1.5" (1.562 m)   Wt 124 lb (56.2 kg)   BMI 23.05 kg/m   HEENT - WNL. Neck - supple.  Chest - Clear equal BS.  Lt Beast exam shows LU quad previous mastectomy scar. There is a vague fullness in the lateral 3 o'clock Lt. Breast. No skin changes are noted.  Cor -  Nl HS. RRR w/o sig MGR. PP 1(+). No edema. MS- FROM w/o deformities.  Gait Nl. Neuro -  Nl w/o focal abnormalities.    Assessment & Plan:   1. Breast lump on left side at 3 o'clock position  - Breast U/S ordered for evaluation

## 2016-07-31 DIAGNOSIS — Z853 Personal history of malignant neoplasm of breast: Secondary | ICD-10-CM | POA: Diagnosis not present

## 2016-07-31 DIAGNOSIS — N6320 Unspecified lump in the left breast, unspecified quadrant: Secondary | ICD-10-CM | POA: Diagnosis not present

## 2016-08-08 ENCOUNTER — Encounter: Payer: Self-pay | Admitting: Internal Medicine

## 2016-08-20 ENCOUNTER — Encounter: Payer: Self-pay | Admitting: Internal Medicine

## 2016-08-29 ENCOUNTER — Encounter: Payer: Self-pay | Admitting: Physician Assistant

## 2016-08-29 ENCOUNTER — Other Ambulatory Visit: Payer: Self-pay | Admitting: Internal Medicine

## 2016-08-29 MED ORDER — AZITHROMYCIN 250 MG PO TABS: ORAL_TABLET | ORAL | 0 refills | Status: AC

## 2016-08-29 MED ORDER — PREDNISONE 20 MG PO TABS: ORAL_TABLET | ORAL | 0 refills | Status: AC

## 2016-10-15 ENCOUNTER — Encounter: Payer: Self-pay | Admitting: Internal Medicine

## 2016-10-30 NOTE — Progress Notes (Signed)
CPE AND 3 months  Assessment:    HYPOTHYROIDISM Hypothyroidism-check TSH level, continue medications the same, reminded to take on an empty stomach 30-53mins before food.  - TSH   Pre-diabetes  at goal  OSTEOPENIA Due for DEXA, getting with MGM in Oct, cont vitamin D   HYPERLIPIDEMIA -continue medications, check lipids, decrease fatty foods, increase activity.  - CBC with Differential - Hepatic function panel - BASIC METABOLIC PANEL WITH GFR - Lipid panel   Allergy  Vitamin D deficiency - Vit D  25 hydroxy (rtn osteoporosis monitoring)  Encounter for long-term (current) use of other medications - Magnesium  History of breast cancer Continue MGM  Encounter for general adult medical examination with abnormal findings  Essential hypertension -     CBC with Differential/Platelet -     BASIC METABOLIC PANEL WITH GFR -     Hepatic function panel -     TSH -     Urinalysis, Routine w reflex microscopic -     Microalbumin / creatinine urine ratio -     EKG 12-Lead  Thyroid Cancer,Hx Continue thyroid replacement  Gastroesophageal reflux disease, esophagitis presence not specified Continue PPI/H2 blocker, diet discussed  Other irritable bowel syndrome If not on benefiber then add it, decrease stress,  if any worsening symptoms, blood in stool, AB pain, etc call office  Future Appointments Date Time Provider [place]  11/13/2017 3:00 PM Vicie Mutters, PA-C [facility] None     Subjective:   Haley Shah is a 76 y.o. female who presents for CPE and 3 month /fu HTN, chol, preDM.  Her blood pressure has been controlled at home, today their BP is BP: 120/86 She does workout. She denies chest pain, shortness of breath, dizziness.  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  Lab Results  Component Value Date   GFRNONAA 69 06/13/2016   She is on cholesterol medication, lipitor 80 1/2 3 days a week. WILL SWITCH RX TO SHOW THIS. Her cholesterol is not at goal. The  cholesterol last visit was:   Lab Results  Component Value Date   CHOL 176 06/13/2016   HDL 50 (L) 06/13/2016   LDLCALC 104 (H) 06/13/2016   TRIG 112 06/13/2016   CHOLHDL 3.5 06/13/2016     Last A1C in the office was:  Lab Results  Component Value Date   HGBA1C 5.0 06/13/2016  Has history of left breast cancer in 2009, gets regular visit.  Patient is on Vitamin D supplement. Lab Results  Component Value Date   VD25OH 62 06/13/2016   She has benign essential tremor.  She is on thyroid medication. Her medication was changed last visit, she is on 1/2 pill rest of the week and 1 pill M,W,F.  Patient denies nervousness and palpitations.  Lab Results  Component Value Date   TSH 0.95 06/13/2016    Names of Other Physician/Practitioners you currently use: 1. [facility]- here for primary care 2. Dr. Katy Fitch, eye doctor, has appointment Oct 2017 due in Oct 3.  Dr. Harmon Pier , dentist, last visit 6 months ago Patient Care Team: Unk Pinto, MD as PCP - General (Internal Medicine)  Medication Review Current Outpatient Prescriptions on File Prior to Visit  Medication Sig Dispense Refill  . aspirin 81 MG tablet Take 81 mg by mouth daily.    Marland Kitchen atorvastatin (LIPITOR) 80 MG tablet TAKE 1/2 to whole tablet DAILY OR AS DIRECTED FOR CHOLESTEROL 90 tablet 3  . latanoprost (XALATAN) 0.005 %  ophthalmic solution Place 1 drop into both eyes at bedtime.    Marland Kitchen levothyroxine (SYNTHROID, LEVOTHROID) 100 MCG tablet Take 1 tablet (100 mcg total) by mouth daily. 90 tablet 3  . meclizine (ANTIVERT) 25 MG tablet Take 1 tablet (25 mg total) by mouth 3 (three) times daily as needed for dizziness. 90 tablet 0  . Multiple Vitamins-Minerals (MULTIVITAMIN PO) Take by mouth daily.    Marland Kitchen OVER THE COUNTER MEDICATION OTC Fiber Gummies  OR   Benefiber.    Marland Kitchen VITAMIN D, CHOLECALCIFEROL, PO Take 2,000 Int'l Units by mouth.      No current facility-administered medications on file  prior to visit.     Current Problems (verified) Patient Active Problem List   Diagnosis Date Noted  . Encounter for Medicare annual wellness exam 02/23/2016  . Essential hypertension 10/27/2013  . Medication management 04/23/2013  . PreDiabetes   . Vitamin D deficiency   . History of left breast cancer 11/03/2012  . Hypothyroidism 03/22/2008  . Hyperlipidemia 03/22/2008  . G E R D 03/22/2008  . IBS 03/22/2008  . Vertigo 03/22/2008  . Thyroid Cancer,Hx 03/22/2008    Screening Tests Immunization History  Administered Date(s) Administered  . Influenza, High Dose Seasonal PF 01/05/2013, 01/05/2015, 11/27/2015  . Pneumococcal Conjugate-13 03/02/2015  . Pneumococcal Polysaccharide-23 03/18/2008  . Td 03/18/2005   Preventative care: Last colonoscopy: 2011 due 10 years Last mammogram: 12/2015, CAT B had DMGM in May normal Last pap smear/pelvic exam: 2012 declines another DEXA: 12/2014, ostoepenia has OV in Oct Echo 2010  Prior vaccinations: TD or Tdap: 2007 declines Influenza: 2017 Pneumococcal: 2010 Prevnar 13: 2016 Shingles/Zostavax: declines due to cost  Allergies Allergies  Allergen Reactions  . Neomycin-Bacitracin Zn-Polymyx     REACTION: rash  . Propoxyphene Hcl     REACTION: syncope  . Darvon Other (See Comments)    Almost fainted when taking, over 50 years.    SURGICAL HISTORY She  has a past surgical history that includes Thyroid surgery (1986); Breast lumpectomy (2010); Colonoscopy; Cardiac catheterization (2006); Knee arthroscopy with medial menisectomy (Left, 05/25/2014); Knee arthroscopy with lateral menisectomy (Left, 05/25/2014); and Chondroplasty (Left, 05/25/2014). FAMILY HISTORY Her family history includes Cancer in her father; Cancer - Ovarian in her sister; Colon cancer in her father; Stroke in her father. SOCIAL HISTORY She  reports that she has never smoked. She has never used smokeless tobacco. She reports that she does not drink alcohol or use  drugs.  Review of Systems  Constitutional: Negative.   HENT: Negative.   Eyes: Negative.   Respiratory: Negative.   Cardiovascular: Negative.   Gastrointestinal: Negative.   Genitourinary: Negative.   Musculoskeletal: Negative.   Skin: Negative.      Objective:   Blood pressure 120/86, pulse 67, temperature (!) 97 F (36.1 C), resp. rate 14, height 5\' 2"  (1.575 m), weight 123 lb 6.4 oz (56 kg), SpO2 95 %. Body mass index is 22.57 kg/m.  General appearance: alert, no distress, WD/WN,  female HEENT: normocephalic, sclerae anicteric, TMs pearly, nares patent, no discharge or erythema, pharynx normal Oral cavity: MMM, no lesions Neck: supple, no lymphadenopathy, no thyromegaly, no masses Heart: RRR, normal S1, S2, no murmurs Lungs: CTA bilaterally, no wheezes, rhonchi, or rales Abdomen: +bs, soft, non tender, non distended, no masses, no hepatomegaly, no splenomegaly Musculoskeletal: nontender, no swelling, no obvious deformity. Patient is able to ambulate well.  Extremities: no edema, no cyanosis, no clubbing Pulses: 2+ symmetric, upper and lower extremities, normal cap refill Neurological:  alert, oriented x 3, CN2-12 intact, strength normal upper extremities and lower extremities, sensation normal throughout, DTRs 2+ throughout, no cerebellar signs, gait normal Psychiatric: normal affect, behavior normal, pleasant     Vicie Mutters, PA-C   10/31/2016

## 2016-10-31 ENCOUNTER — Encounter: Payer: Self-pay | Admitting: Physician Assistant

## 2016-10-31 ENCOUNTER — Ambulatory Visit (INDEPENDENT_AMBULATORY_CARE_PROVIDER_SITE_OTHER): Payer: PPO | Admitting: Physician Assistant

## 2016-10-31 VITALS — BP 120/86 | HR 67 | Temp 97.0°F | Resp 14 | Ht 62.0 in | Wt 123.4 lb

## 2016-10-31 DIAGNOSIS — K219 Gastro-esophageal reflux disease without esophagitis: Secondary | ICD-10-CM

## 2016-10-31 DIAGNOSIS — Z853 Personal history of malignant neoplasm of breast: Secondary | ICD-10-CM | POA: Diagnosis not present

## 2016-10-31 DIAGNOSIS — E039 Hypothyroidism, unspecified: Secondary | ICD-10-CM | POA: Diagnosis not present

## 2016-10-31 DIAGNOSIS — Z136 Encounter for screening for cardiovascular disorders: Secondary | ICD-10-CM | POA: Diagnosis not present

## 2016-10-31 DIAGNOSIS — Z85850 Personal history of malignant neoplasm of thyroid: Secondary | ICD-10-CM

## 2016-10-31 DIAGNOSIS — R6889 Other general symptoms and signs: Secondary | ICD-10-CM | POA: Diagnosis not present

## 2016-10-31 DIAGNOSIS — Z0001 Encounter for general adult medical examination with abnormal findings: Secondary | ICD-10-CM

## 2016-10-31 DIAGNOSIS — I10 Essential (primary) hypertension: Secondary | ICD-10-CM

## 2016-10-31 DIAGNOSIS — Z0000 Encounter for general adult medical examination without abnormal findings: Secondary | ICD-10-CM | POA: Diagnosis not present

## 2016-10-31 DIAGNOSIS — E782 Mixed hyperlipidemia: Secondary | ICD-10-CM | POA: Diagnosis not present

## 2016-10-31 DIAGNOSIS — E559 Vitamin D deficiency, unspecified: Secondary | ICD-10-CM

## 2016-10-31 DIAGNOSIS — K588 Other irritable bowel syndrome: Secondary | ICD-10-CM

## 2016-10-31 DIAGNOSIS — Z79899 Other long term (current) drug therapy: Secondary | ICD-10-CM

## 2016-10-31 DIAGNOSIS — R42 Dizziness and giddiness: Secondary | ICD-10-CM

## 2016-10-31 DIAGNOSIS — R7303 Prediabetes: Secondary | ICD-10-CM

## 2016-10-31 MED ORDER — ATORVASTATIN CALCIUM 40 MG PO TABS: ORAL_TABLET | ORAL | 5 refills | Status: DC

## 2016-11-01 LAB — URINALYSIS, ROUTINE W REFLEX MICROSCOPIC
BACTERIA UA: NONE SEEN /HPF
BILIRUBIN URINE: NEGATIVE
GLUCOSE, UA: NEGATIVE
HYALINE CAST: NONE SEEN /LPF
KETONES UR: NEGATIVE
Nitrite: NEGATIVE
PH: 6.5 (ref 5.0–8.0)
PROTEIN: NEGATIVE
SPECIFIC GRAVITY, URINE: 1.006 (ref 1.001–1.03)
Squamous Epithelial / LPF: NONE SEEN /HPF (ref <=5)

## 2016-11-01 LAB — URINE CULTURE
MICRO NUMBER:: 80889193
RESULT: NO GROWTH
SPECIMEN QUALITY:: ADEQUATE

## 2016-11-01 LAB — LIPID PANEL
Cholesterol: 186 mg/dL (ref <200)
HDL: 63 mg/dL (ref >50)
LDL Cholesterol (Calc): 104 mg/dL (calc) — ABNORMAL HIGH
NON-HDL CHOLESTEROL (CALC): 123 mg/dL (ref <130)
TRIGLYCERIDES: 96 mg/dL (ref <150)
Total CHOL/HDL Ratio: 3 (calc) (ref <5.0)

## 2016-11-01 LAB — BASIC METABOLIC PANEL WITH GFR
BUN / CREAT RATIO: 14 (calc) (ref 6–22)
BUN: 14 mg/dL (ref 7–25)
CALCIUM: 10 mg/dL (ref 8.6–10.4)
CHLORIDE: 103 mmol/L (ref 98–110)
CO2: 27 mmol/L (ref 20–32)
Creat: 0.97 mg/dL — ABNORMAL HIGH (ref 0.60–0.93)
GFR, EST AFRICAN AMERICAN: 65 mL/min/{1.73_m2} (ref >=60)
GFR, EST NON AFRICAN AMERICAN: 56 mL/min/{1.73_m2} — AB (ref >=60)
Glucose, Bld: 73 mg/dL (ref 65–99)
POTASSIUM: 3.9 mmol/L (ref 3.5–5.3)
SODIUM: 141 mmol/L (ref 135–146)

## 2016-11-01 LAB — CBC WITH DIFFERENTIAL/PLATELET
Basophils Absolute: 58 cells/uL (ref 0–200)
Basophils Relative: 0.7 %
EOS PCT: 1.5 %
Eosinophils Absolute: 125 cells/uL (ref 15–500)
HCT: 42.1 % (ref 35.0–45.0)
Hemoglobin: 14.1 g/dL (ref 11.7–15.5)
Lymphs Abs: 2440 cells/uL (ref 850–3900)
MCH: 29.6 pg (ref 27.0–33.0)
MCHC: 33.5 g/dL (ref 32.0–36.0)
MCV: 88.4 fL (ref 80.0–100.0)
MONOS PCT: 9.2 %
MPV: 10.1 fL (ref 7.5–12.5)
Neutro Abs: 4914 cells/uL (ref 1500–7800)
Neutrophils Relative %: 59.2 %
PLATELETS: 225 10*3/uL (ref 140–400)
RBC: 4.76 10*6/uL (ref 3.80–5.10)
RDW: 13.4 % (ref 11.0–15.0)
TOTAL LYMPHOCYTE: 29.4 %
WBC: 8.3 10*3/uL (ref 3.8–10.8)
WBCMIX: 764 {cells}/uL (ref 200–950)

## 2016-11-01 LAB — HEPATIC FUNCTION PANEL
AG Ratio: 1.9 (calc) (ref 1.0–2.5)
ALBUMIN MSPROF: 4.6 g/dL (ref 3.6–5.1)
ALKALINE PHOSPHATASE (APISO): 61 U/L (ref 33–130)
ALT: 13 U/L (ref 6–29)
AST: 20 U/L (ref 10–35)
BILIRUBIN DIRECT: 0.1 mg/dL (ref 0.0–0.2)
BILIRUBIN INDIRECT: 0.4 mg/dL (ref 0.2–1.2)
BILIRUBIN TOTAL: 0.5 mg/dL (ref 0.2–1.2)
Globulin: 2.4 g/dL (calc) (ref 1.9–3.7)
Total Protein: 7 g/dL (ref 6.1–8.1)

## 2016-11-01 LAB — MICROALBUMIN / CREATININE URINE RATIO
Creatinine, Urine: 31 mg/dL (ref 20–320)
MICROALB/CREAT RATIO: 6 ug/mg{creat} (ref <30)
Microalb, Ur: 0.2 mg/dL

## 2016-11-01 LAB — TSH: TSH: 1.75 m[IU]/L (ref 0.40–4.50)

## 2016-12-20 ENCOUNTER — Ambulatory Visit (INDEPENDENT_AMBULATORY_CARE_PROVIDER_SITE_OTHER): Payer: PPO

## 2016-12-20 ENCOUNTER — Encounter: Payer: Self-pay | Admitting: *Deleted

## 2016-12-20 DIAGNOSIS — Z23 Encounter for immunization: Secondary | ICD-10-CM | POA: Diagnosis not present

## 2016-12-26 ENCOUNTER — Encounter: Payer: Self-pay | Admitting: *Deleted

## 2017-01-01 DIAGNOSIS — H401111 Primary open-angle glaucoma, right eye, mild stage: Secondary | ICD-10-CM | POA: Diagnosis not present

## 2017-01-01 DIAGNOSIS — H25811 Combined forms of age-related cataract, right eye: Secondary | ICD-10-CM | POA: Diagnosis not present

## 2017-01-01 DIAGNOSIS — H04123 Dry eye syndrome of bilateral lacrimal glands: Secondary | ICD-10-CM | POA: Diagnosis not present

## 2017-01-01 DIAGNOSIS — H401122 Primary open-angle glaucoma, left eye, moderate stage: Secondary | ICD-10-CM | POA: Diagnosis not present

## 2017-01-01 DIAGNOSIS — Z961 Presence of intraocular lens: Secondary | ICD-10-CM | POA: Diagnosis not present

## 2017-01-13 DIAGNOSIS — Z1231 Encounter for screening mammogram for malignant neoplasm of breast: Secondary | ICD-10-CM | POA: Diagnosis not present

## 2017-01-13 DIAGNOSIS — Z853 Personal history of malignant neoplasm of breast: Secondary | ICD-10-CM | POA: Diagnosis not present

## 2017-01-13 DIAGNOSIS — M8589 Other specified disorders of bone density and structure, multiple sites: Secondary | ICD-10-CM | POA: Diagnosis not present

## 2017-01-13 LAB — HM DEXA SCAN

## 2017-01-22 ENCOUNTER — Telehealth: Payer: Self-pay | Admitting: *Deleted

## 2017-01-22 NOTE — Telephone Encounter (Signed)
Patient advised of bone density results.

## 2017-02-10 ENCOUNTER — Encounter: Payer: Self-pay | Admitting: Internal Medicine

## 2017-02-21 ENCOUNTER — Ambulatory Visit: Payer: Self-pay | Admitting: Physician Assistant

## 2017-02-25 NOTE — Progress Notes (Signed)
FOLLOW UP  Assessment and Plan:   Hypertension Values controlled out of office - recheck by provider improved - patient reports white coat syndrome Monitor blood pressure at home; patient to call if consistently greater than 130/80 Continue DASH diet.   Reminder to go to the ER if any CP, SOB, nausea, dizziness, severe HA, changes vision/speech, left arm numbness and tingling and jaw pain.  Cholesterol Continue medication  Continue low cholesterol diet and exercise.  Check lipid panel.   History of Prediabetes Currently well controlled by lifestyle Discussed diet/exercise, weight management  Defer A1C as has been well controlled; BMP  Hypothyroidism continue medications the same reminded to take on an empty stomach 30-21mins before food.  check TSH level  Vitamin D Def/ osteoporosis prevention Continue supplementation Check Vit D level  Viral URI Short duration, unremarkable exam- Discussed the importance of avoiding unnecessary antibiotic therapy. Suggested symptomatic OTC remedies, hydration, immune support        Follow up as needed.  Continue diet and meds as discussed. Further disposition pending results of labs. Discussed med's effects and SE's.   Over 30 minutes of exam, counseling, chart review, and critical decision making was performed.   Future Appointments  Date Time Provider [place]  11/13/2017  3:00 PM Vicie Mutters, PA-C [facility] None    ----------------------------------------------------------------------------------------------------------------------  HPI 76 y.o. female  presents for 3 month follow up on hypertension, cholesterol, vitamin D deficiency and monitoring due to history of prediabetes currently well controlled by lifestyle.   Patient also complains  of symptoms of a URI x 4 days. Symptoms include intermittent running nose, 1-2 days of sore throat which has improved. Discharge is clear, denies fever/chills/HA/dyspnea.   Her  blood pressure has been controlled at home, today their BP is BP: 132/82  She does not workout. She denies chest pain, shortness of breath, dizziness.   She is on cholesterol medication and denies myalgias. Her cholesterol is not at goal. The cholesterol last visit was:   Lab Results  Component Value Date   CHOL 186 10/31/2016   HDL 63 10/31/2016   LDLCALC 104 (H) 06/13/2016   TRIG 96 10/31/2016   CHOLHDL 3.0 10/31/2016    She has been working on diet and exercise for personal history of prediabetes, and denies increased appetite, nausea, paresthesia of the feet, polydipsia, polyuria, visual disturbances and vomiting. Last A1C in the office was:  Lab Results  Component Value Date   HGBA1C 5.0 06/13/2016   She is on thyroid medication. Her medication was not changed last visit.   Lab Results  Component Value Date   TSH 1.75 10/31/2016   Patient is on Vitamin D supplement and near goal at last check:    Lab Results  Component Value Date   VD25OH 62 06/13/2016        Current Medications:  Current Outpatient Medications on File Prior to Visit  Medication Sig  . aspirin 81 MG tablet Take 81 mg by mouth daily.  Marland Kitchen atorvastatin (LIPITOR) 40 MG tablet Take 1 pill 3 days a week  . latanoprost (XALATAN) 0.005 % ophthalmic solution Place 1 drop into both eyes at bedtime.  Marland Kitchen levothyroxine (SYNTHROID, LEVOTHROID) 100 MCG tablet Take 1 tablet (100 mcg total) by mouth daily.  . meclizine (ANTIVERT) 25 MG tablet Take 1 tablet (25 mg total) by mouth 3 (three) times daily as needed for dizziness.  . Multiple Vitamins-Minerals (MULTIVITAMIN PO) Take by mouth daily.  Marland Kitchen OVER THE COUNTER MEDICATION OTC Fiber  Gummies  OR   Benefiber.  Marland Kitchen VITAMIN D, CHOLECALCIFEROL, PO Take 2,000 Int'l Units by mouth.    No current facility-administered medications on file prior to visit.      Allergies:  Allergies  Allergen Reactions  . Neomycin-Bacitracin Zn-Polymyx     REACTION: rash  . Propoxyphene Hcl      REACTION: syncope  . Darvon Other (See Comments)    Almost fainted when taking, over 50 years.     Medical History:  Past Medical History:  Diagnosis Date  . Allergy   . Cancer [facility]) 2010   Left Breast  . Dyspnea    chronic-no cardiac reason  . GERD (gastroesophageal reflux disease)   . Glaucoma   . History of thyroid cancer   . Hyperlipidemia   . Hypertension    no meds  . IBS (irritable bowel syndrome)   . Pre-diabetes   . Sciatica    RLE  . Thyroid disease    Hypo  . Vitamin D deficiency   . Wears glasses    Family history- Reviewed and unchanged Social history- Reviewed and unchanged   Review of Systems:  Review of Systems  Constitutional: Negative for chills, fever, malaise/fatigue and weight loss.  HENT: Positive for sore throat (Mild, improved with lozenges). Negative for congestion, hearing loss and tinnitus.   Eyes: Negative for blurred vision and double vision.  Respiratory: Negative for cough, shortness of breath and wheezing.   Cardiovascular: Negative for chest pain, palpitations, orthopnea, claudication and leg swelling.  Gastrointestinal: Negative for abdominal pain, blood in stool, constipation, diarrhea, heartburn, melena, nausea and vomiting.  Genitourinary: Negative.   Musculoskeletal: Negative for joint pain and myalgias.  Skin: Negative for rash.  Neurological: Negative for dizziness, tingling, sensory change, weakness and headaches.  Endo/Heme/Allergies: Negative for polydipsia.  Psychiatric/Behavioral: Negative.   All other systems reviewed and are negative.     Physical Exam: BP 132/82   Pulse 66   Temp (!) 97.5 F (36.4 C)   Ht 5\' 2"  (1.575 m)   Wt 124 lb 6.4 oz (56.4 kg)   SpO2 97%   BMI 22.75 kg/m  Wt Readings from Last 3 Encounters:  02/26/17 124 lb 6.4 oz (56.4 kg)  10/31/16 123 lb 6.4 oz (56 kg)  07/30/16 124 lb (56.2 kg)   General Appearance: Well nourished, in no apparent distress. Eyes: PERRLA, EOMs, conjunctiva  no swelling or erythema Sinuses: No Frontal/maxillary tenderness ENT/Mouth: Ext aud canals clear, TMs without erythema, bulging. Post pharynx mildly injected. No swelling, or exudate on post pharynx.  Tonsils not swollen or erythematous. Hearing normal.  Neck: Supple, thyroid normal.  Respiratory: Respiratory effort normal, BS equal bilaterally without rales, rhonchi, wheezing or stridor.  Cardio: RRR with no MRGs. Brisk peripheral pulses without edema.  Abdomen: Soft, + BS.  Non tender, no guarding, rebound, hernias, masses. Lymphatics: Non tender without lymphadenopathy.  Musculoskeletal: Full ROM, 5/5 strength, Normal gait Skin: Warm, dry without rashes, lesions, ecchymosis.  Neuro: Cranial nerves intact. No cerebellar symptoms.  Psych: Awake and oriented X 3, normal affect, Insight and Judgment appropriate.    Izora Ribas, NP 11:55 AM [facility]

## 2017-02-26 ENCOUNTER — Ambulatory Visit: Payer: PPO | Admitting: Adult Health

## 2017-02-26 ENCOUNTER — Encounter: Payer: Self-pay | Admitting: Adult Health

## 2017-02-26 VITALS — BP 132/82 | HR 66 | Temp 97.5°F | Ht 62.0 in | Wt 124.4 lb

## 2017-02-26 DIAGNOSIS — E782 Mixed hyperlipidemia: Secondary | ICD-10-CM | POA: Diagnosis not present

## 2017-02-26 DIAGNOSIS — J069 Acute upper respiratory infection, unspecified: Secondary | ICD-10-CM | POA: Diagnosis not present

## 2017-02-26 DIAGNOSIS — I10 Essential (primary) hypertension: Secondary | ICD-10-CM

## 2017-02-26 DIAGNOSIS — Z87898 Personal history of other specified conditions: Secondary | ICD-10-CM | POA: Diagnosis not present

## 2017-02-26 DIAGNOSIS — E559 Vitamin D deficiency, unspecified: Secondary | ICD-10-CM

## 2017-02-26 DIAGNOSIS — Z79899 Other long term (current) drug therapy: Secondary | ICD-10-CM | POA: Diagnosis not present

## 2017-02-26 DIAGNOSIS — E039 Hypothyroidism, unspecified: Secondary | ICD-10-CM | POA: Diagnosis not present

## 2017-02-26 NOTE — Patient Instructions (Signed)

## 2017-02-27 LAB — TSH: TSH: 2.44 m[IU]/L (ref 0.40–4.50)

## 2017-02-27 LAB — CBC WITH DIFFERENTIAL/PLATELET
BASOS PCT: 0.8 %
Basophils Absolute: 50 cells/uL (ref 0–200)
EOS ABS: 221 {cells}/uL (ref 15–500)
Eosinophils Relative: 3.5 %
HEMATOCRIT: 40.8 % (ref 35.0–45.0)
Hemoglobin: 13.6 g/dL (ref 11.7–15.5)
LYMPHS ABS: 1449 {cells}/uL (ref 850–3900)
MCH: 29.8 pg (ref 27.0–33.0)
MCHC: 33.3 g/dL (ref 32.0–36.0)
MCV: 89.3 fL (ref 80.0–100.0)
MPV: 9.5 fL (ref 7.5–12.5)
Monocytes Relative: 11.7 %
NEUTROS PCT: 61 %
Neutro Abs: 3843 cells/uL (ref 1500–7800)
Platelets: 279 10*3/uL (ref 140–400)
RBC: 4.57 10*6/uL (ref 3.80–5.10)
RDW: 12.4 % (ref 11.0–15.0)
Total Lymphocyte: 23 %
WBC: 6.3 10*3/uL (ref 3.8–10.8)
WBCMIX: 737 {cells}/uL (ref 200–950)

## 2017-02-27 LAB — BASIC METABOLIC PANEL WITH GFR
BUN: 11 mg/dL (ref 7–25)
CHLORIDE: 104 mmol/L (ref 98–110)
CO2: 29 mmol/L (ref 20–32)
CREATININE: 0.87 mg/dL (ref 0.60–0.93)
Calcium: 9.3 mg/dL (ref 8.6–10.4)
GFR, EST AFRICAN AMERICAN: 74 mL/min/{1.73_m2} (ref >=60)
GFR, EST NON AFRICAN AMERICAN: 64 mL/min/{1.73_m2} (ref >=60)
GLUCOSE: 84 mg/dL (ref 65–99)
Potassium: 5 mmol/L (ref 3.5–5.3)
Sodium: 141 mmol/L (ref 135–146)

## 2017-02-27 LAB — HEPATIC FUNCTION PANEL
AG Ratio: 1.8 (calc) (ref 1.0–2.5)
ALT: 10 U/L (ref 6–29)
AST: 17 U/L (ref 10–35)
Albumin: 4.4 g/dL (ref 3.6–5.1)
Alkaline phosphatase (APISO): 83 U/L (ref 33–130)
BILIRUBIN DIRECT: 0.1 mg/dL (ref 0.0–0.2)
BILIRUBIN INDIRECT: 0.3 mg/dL (ref 0.2–1.2)
BILIRUBIN TOTAL: 0.4 mg/dL (ref 0.2–1.2)
GLOBULIN: 2.5 g/dL (ref 1.9–3.7)
Total Protein: 6.9 g/dL (ref 6.1–8.1)

## 2017-02-27 LAB — LIPID PANEL
CHOL/HDL RATIO: 4 (calc) (ref <5.0)
CHOLESTEROL: 180 mg/dL (ref <200)
HDL: 45 mg/dL — ABNORMAL LOW (ref >50)
LDL Cholesterol (Calc): 106 mg/dL (calc) — ABNORMAL HIGH
Non-HDL Cholesterol (Calc): 135 mg/dL (calc) — ABNORMAL HIGH (ref <130)
Triglycerides: 172 mg/dL — ABNORMAL HIGH (ref <150)

## 2017-02-27 LAB — VITAMIN D 25 HYDROXY (VIT D DEFICIENCY, FRACTURES): VIT D 25 HYDROXY: 73 ng/mL (ref 30–100)

## 2017-03-05 ENCOUNTER — Other Ambulatory Visit: Payer: Self-pay | Admitting: Physician Assistant

## 2017-04-11 ENCOUNTER — Ambulatory Visit (INDEPENDENT_AMBULATORY_CARE_PROVIDER_SITE_OTHER): Payer: PPO

## 2017-04-11 DIAGNOSIS — I10 Essential (primary) hypertension: Secondary | ICD-10-CM

## 2017-04-11 NOTE — Progress Notes (Signed)
Patient presents to the office for a BP check. Blood pressure taken in sitting position, reading today was 122/76, taken in right arm.

## 2017-05-01 ENCOUNTER — Encounter: Payer: Self-pay | Admitting: Physician Assistant

## 2017-05-07 DIAGNOSIS — L570 Actinic keratosis: Secondary | ICD-10-CM | POA: Diagnosis not present

## 2017-05-07 DIAGNOSIS — L309 Dermatitis, unspecified: Secondary | ICD-10-CM | POA: Diagnosis not present

## 2017-05-07 DIAGNOSIS — Z85828 Personal history of other malignant neoplasm of skin: Secondary | ICD-10-CM | POA: Diagnosis not present

## 2017-05-23 NOTE — Progress Notes (Signed)
MEDICARE WELLNESS AND 3 months  Assessment:    HYPOTHYROIDISM Hypothyroidism-check TSH level, continue medications the same, reminded to take on an empty stomach 30-68mins before food.  - TSH   Pre-diabetes  at goal  OSTEOPENIA Due for DEXA 2 years, getting with MGM in Oct, cont vitamin D   HYPERLIPIDEMIA -continue medications, check lipids, decrease fatty foods, increase activity.  - CBC with Differential - Hepatic function panel - BASIC METABOLIC PANEL WITH GFR - Lipid panel   Allergy  Vitamin D deficiency - Vit D  25 hydroxy (rtn osteoporosis monitoring)  Encounter for long-term (current) use of other medications - Magnesium  History of breast cancer Continue MGM  Essential hypertension -     CBC with Differential/Platelet -     BASIC METABOLIC PANEL WITH GFR -     Hepatic function panel -     TSH  Thyroid Cancer,Hx Continue thyroid replacement  Gastroesophageal reflux disease, esophagitis presence not specified Continue PPI/H2 blocker, diet discussed  Other irritable bowel syndrome If not on benefiber then add it, decrease stress,  if any worsening symptoms, blood in stool, AB pain, etc call office  Future Appointments  Date Time Provider [place]  11/13/2017  3:00 PM Vicie Mutters, PA-C [facility] None     Subjective:   Haley Shah is a 77 y.o. female who presents for Wellness and 3 month /fu HTN, chol, preDM.  Her blood pressure has been controlled at home, today their BP is BP: 130/72 She does workout. She denies chest pain, shortness of breath, dizziness.  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  Lab Results  Component Value Date   GFRNONAA 64 02/26/2017   She is on cholesterol medication, lipitor 80 1/2 3 days a week. WILL SWITCH RX TO SHOW THIS. Her cholesterol is not at goal. The cholesterol last visit was:   Lab Results  Component Value Date   CHOL 180 02/26/2017   HDL 45 (L) 02/26/2017   LDLCALC 104 (H) 06/13/2016   TRIG 172 (H)  02/26/2017   CHOLHDL 4.0 02/26/2017     Last A1C in the office was:  Lab Results  Component Value Date   HGBA1C 5.0 06/13/2016  Has history of left breast cancer in 2009, gets regular visit.  Patient is on Vitamin D supplement. Lab Results  Component Value Date   VD25OH 73 02/26/2017   She has benign essential tremor.  She is on thyroid medication. Her medication was changed last visit, she is on 1/2 pill rest of the week and 1 pill M,W,F.  Patient denies nervousness and palpitations.  Lab Results  Component Value Date   TSH 2.44 02/26/2017   BMI is Body mass index is 22.46 kg/m., she is working on diet and exercise. Wt Readings from Last 3 Encounters:  05/27/17 122 lb 12.8 oz (55.7 kg)  02/26/17 124 lb 6.4 oz (56.4 kg)  10/31/16 123 lb 6.4 oz (56 kg)    Names of Other Physician/Practitioners you currently use: 1. [facility]- here for primary care 2. Dr. Katy Fitch, eye doctor, has appointment every 6 months 3.  Dr. Harmon Pier , dentist, last visit 6 months ago Patient Care Team: Unk Pinto, MD as PCP - General (Internal Medicine)  Medication Review Current Outpatient Medications on File Prior to Visit  Medication Sig Dispense Refill  . aspirin 81 MG tablet Take 81 mg by mouth daily.    Marland Kitchen latanoprost (XALATAN) 0.005 % ophthalmic solution Place 1 drop into both  eyes at bedtime.    Marland Kitchen levothyroxine (SYNTHROID, LEVOTHROID) 100 MCG tablet TAKE ONE TABLET BY MOUTH ONCE DAILY 90 tablet 3  . meclizine (ANTIVERT) 25 MG tablet Take 1 tablet (25 mg total) by mouth 3 (three) times daily as needed for dizziness. 90 tablet 0  . Multiple Vitamins-Minerals (MULTIVITAMIN PO) Take by mouth daily.    Marland Kitchen OVER THE COUNTER MEDICATION OTC Fiber Gummies  OR   Benefiber.    Marland Kitchen VITAMIN D, CHOLECALCIFEROL, PO Take 2,000 Int'l Units by mouth.      No current facility-administered medications on file prior to visit.     Current Problems (verified) Patient Active  Problem List   Diagnosis Date Noted  . Essential hypertension 10/27/2013  . Medication management 04/23/2013  . History of prediabetes   . Vitamin D deficiency   . History of left breast cancer 11/03/2012  . Hypothyroidism 03/22/2008  . Hyperlipidemia 03/22/2008  . G E R D 03/22/2008  . IBS 03/22/2008  . Vertigo 03/22/2008  . Thyroid Cancer,Hx 03/22/2008    Screening Tests Immunization History  Administered Date(s) Administered  . Influenza, High Dose Seasonal PF 01/05/2013, 01/05/2015, 11/27/2015, 12/20/2016  . Pneumococcal Conjugate-13 03/02/2015  . Pneumococcal Polysaccharide-23 03/18/2008  . Td 03/18/2005   Preventative care: Last colonoscopy: 2011 due 10 years Last mammogram: 07/2016, CAT B had DMGM in May normal Last pap smear/pelvic exam: 2012 declines another DEXA: 12/2016, ostoepenia Echo 2010  Prior vaccinations: TD or Tdap: 2007 declines Influenza: 2017 Pneumococcal: 2010 Prevnar 13: 2016 Shingles/Zostavax: declines due to cost  Allergies Allergies  Allergen Reactions  . Neomycin-Bacitracin Zn-Polymyx     REACTION: rash  . Propoxyphene Hcl     REACTION: syncope  . Darvon Other (See Comments)    Almost fainted when taking, over 50 years.    SURGICAL HISTORY She  has a past surgical history that includes Thyroid surgery (1986); Breast lumpectomy (2010); Colonoscopy; Cardiac catheterization (2006); Knee arthroscopy with medial menisectomy (Left, 05/25/2014); Knee arthroscopy with lateral menisectomy (Left, 05/25/2014); and Chondroplasty (Left, 05/25/2014). FAMILY HISTORY Her family history includes Cancer in her father; Cancer - Ovarian in her sister; Colon cancer in her father; Stroke in her father. SOCIAL HISTORY She  reports that  has never smoked. she has never used smokeless tobacco. She reports that she does not drink alcohol or use drugs.  MEDICARE WELLNESS OBJECTIVES: Physical activity: Current Exercise Habits: The patient does not participate in  regular exercise at present Cardiac risk factors: Cardiac Risk Factors include: advanced age (>5men, >74 women);dyslipidemia;hypertension;sedentary lifestyle Depression/mood screen:   Depression screen [facility] 2/9 05/27/2017  Decreased Interest 0  Down, Depressed, Hopeless 0  PHQ - 2 Score 0    ADLs:  In your present state of health, do you have any difficulty performing the following activities: 05/27/2017 06/14/2016  Hearing? N N  Vision? N N  Difficulty concentrating or making decisions? N N  Walking or climbing stairs? N N  Dressing or bathing? N N  Doing errands, shopping? N N  Some recent data might be hidden     Cognitive Testing  Alert? Yes  Normal Appearance?Yes  Oriented to person? Yes  Place? Yes   Time? Yes  Recall of three objects?  Yes  Can perform simple calculations? Yes  Displays appropriate judgment?Yes  Can read the correct time from a watch face?Yes  EOL planning: Does Patient Have a Medical Advance Directive?: Yes Type of Advance Directive: Healthcare Power of Attorney, Living will Copy of Healthcare Power of  Attorney in Chart?: No - copy requested    Review of Systems  Constitutional: Negative.   HENT: Negative.   Eyes: Negative.   Respiratory: Negative.   Cardiovascular: Negative.   Gastrointestinal: Negative.   Genitourinary: Negative.   Musculoskeletal: Negative.   Skin: Negative.      Objective:   Blood pressure 130/72, pulse 61, temperature (!) 97.4 F (36.3 C), height 5\' 2"  (1.575 m), weight 122 lb 12.8 oz (55.7 kg), SpO2 98 %. Body mass index is 22.46 kg/m.  General appearance: alert, no distress, WD/WN,  female HEENT: normocephalic, sclerae anicteric, TMs pearly, nares patent, no discharge or erythema, pharynx normal Oral cavity: MMM, no lesions Neck: supple, no lymphadenopathy, no thyromegaly, no masses Heart: RRR, normal S1, S2, no murmurs Lungs: CTA bilaterally, no wheezes, rhonchi, or rales Abdomen: +bs, soft, non tender, non  distended, no masses, no hepatomegaly, no splenomegaly Musculoskeletal: nontender, no swelling, no obvious deformity. Patient is able to ambulate well.  Extremities: no edema, no cyanosis, no clubbing Pulses: 2+ symmetric, upper and lower extremities, normal cap refill Neurological: alert, oriented x 3, CN2-12 intact, strength normal upper extremities and lower extremities, sensation normal throughout, DTRs 2+ throughout, no cerebellar signs, gait normal Psychiatric: normal affect, behavior normal, pleasant    Medicare Attestation I have personally reviewed: The patient's medical and social history Their use of alcohol, tobacco or illicit drugs Their current medications and supplements The patient's functional ability including ADLs,fall risks, home safety risks, cognitive, and hearing and visual impairment Diet and physical activities Evidence for depression or mood disorders  The patient's weight, height, BMI, and visual acuity have been recorded in the chart.  I have made referrals, counseling, and provided education to the patient based on review of the above and I have provided the patient with a written personalized care plan for preventive services.     Vicie Mutters, PA-C   05/27/2017

## 2017-05-27 ENCOUNTER — Encounter: Payer: Self-pay | Admitting: Physician Assistant

## 2017-05-27 ENCOUNTER — Ambulatory Visit (INDEPENDENT_AMBULATORY_CARE_PROVIDER_SITE_OTHER): Payer: PPO | Admitting: Physician Assistant

## 2017-05-27 VITALS — BP 130/72 | HR 61 | Temp 97.4°F | Ht 62.0 in | Wt 122.8 lb

## 2017-05-27 DIAGNOSIS — I10 Essential (primary) hypertension: Secondary | ICD-10-CM

## 2017-05-27 DIAGNOSIS — R6889 Other general symptoms and signs: Secondary | ICD-10-CM | POA: Diagnosis not present

## 2017-05-27 DIAGNOSIS — E782 Mixed hyperlipidemia: Secondary | ICD-10-CM | POA: Diagnosis not present

## 2017-05-27 DIAGNOSIS — Z6822 Body mass index (BMI) 22.0-22.9, adult: Secondary | ICD-10-CM

## 2017-05-27 DIAGNOSIS — Z0001 Encounter for general adult medical examination with abnormal findings: Secondary | ICD-10-CM

## 2017-05-27 DIAGNOSIS — E039 Hypothyroidism, unspecified: Secondary | ICD-10-CM | POA: Diagnosis not present

## 2017-05-27 DIAGNOSIS — Z79899 Other long term (current) drug therapy: Secondary | ICD-10-CM | POA: Diagnosis not present

## 2017-05-27 DIAGNOSIS — Z0000 Encounter for general adult medical examination without abnormal findings: Secondary | ICD-10-CM

## 2017-05-27 MED ORDER — ATORVASTATIN CALCIUM 40 MG PO TABS: ORAL_TABLET | ORAL | 5 refills | Status: DC

## 2017-05-27 NOTE — Patient Instructions (Signed)

## 2017-05-28 LAB — CBC WITH DIFFERENTIAL/PLATELET
Basophils Absolute: 69 cells/uL (ref 0–200)
Basophils Relative: 1.1 %
EOS PCT: 2.1 %
Eosinophils Absolute: 132 cells/uL (ref 15–500)
HCT: 40.5 % (ref 35.0–45.0)
Hemoglobin: 13.6 g/dL (ref 11.7–15.5)
Lymphs Abs: 1468 cells/uL (ref 850–3900)
MCH: 30.1 pg (ref 27.0–33.0)
MCHC: 33.6 g/dL (ref 32.0–36.0)
MCV: 89.6 fL (ref 80.0–100.0)
MONOS PCT: 7.9 %
MPV: 9.5 fL (ref 7.5–12.5)
NEUTROS ABS: 4133 {cells}/uL (ref 1500–7800)
NEUTROS PCT: 65.6 %
Platelets: 265 10*3/uL (ref 140–400)
RBC: 4.52 10*6/uL (ref 3.80–5.10)
RDW: 13.3 % (ref 11.0–15.0)
Total Lymphocyte: 23.3 %
WBC mixed population: 498 cells/uL (ref 200–950)
WBC: 6.3 10*3/uL (ref 3.8–10.8)

## 2017-05-28 LAB — BASIC METABOLIC PANEL WITH GFR
BUN: 13 mg/dL (ref 7–25)
CALCIUM: 9.6 mg/dL (ref 8.6–10.4)
CHLORIDE: 107 mmol/L (ref 98–110)
CO2: 27 mmol/L (ref 20–32)
Creat: 0.87 mg/dL (ref 0.60–0.93)
GFR, Est African American: 74 mL/min/{1.73_m2} (ref >=60)
GFR, Est Non African American: 64 mL/min/{1.73_m2} (ref >=60)
GLUCOSE: 85 mg/dL (ref 65–99)
POTASSIUM: 5.2 mmol/L (ref 3.5–5.3)
SODIUM: 143 mmol/L (ref 135–146)

## 2017-05-28 LAB — HEPATIC FUNCTION PANEL
AG Ratio: 1.9 (calc) (ref 1.0–2.5)
ALBUMIN MSPROF: 4.5 g/dL (ref 3.6–5.1)
ALKALINE PHOSPHATASE (APISO): 76 U/L (ref 33–130)
ALT: 8 U/L (ref 6–29)
AST: 15 U/L (ref 10–35)
Bilirubin, Direct: 0.1 mg/dL (ref 0.0–0.2)
Globulin: 2.4 g/dL (calc) (ref 1.9–3.7)
Indirect Bilirubin: 0.4 mg/dL (calc) (ref 0.2–1.2)
TOTAL PROTEIN: 6.9 g/dL (ref 6.1–8.1)
Total Bilirubin: 0.5 mg/dL (ref 0.2–1.2)

## 2017-05-28 LAB — LIPID PANEL
CHOLESTEROL: 170 mg/dL (ref <200)
HDL: 46 mg/dL — AB (ref >50)
LDL Cholesterol (Calc): 100 mg/dL (calc) — ABNORMAL HIGH
NON-HDL CHOLESTEROL (CALC): 124 mg/dL (ref <130)
Total CHOL/HDL Ratio: 3.7 (calc) (ref <5.0)
Triglycerides: 147 mg/dL (ref <150)

## 2017-05-28 LAB — TSH: TSH: 0.92 m[IU]/L (ref 0.40–4.50)

## 2017-06-16 DIAGNOSIS — Z961 Presence of intraocular lens: Secondary | ICD-10-CM | POA: Diagnosis not present

## 2017-06-16 DIAGNOSIS — Z85828 Personal history of other malignant neoplasm of skin: Secondary | ICD-10-CM | POA: Diagnosis not present

## 2017-06-16 DIAGNOSIS — L570 Actinic keratosis: Secondary | ICD-10-CM | POA: Diagnosis not present

## 2017-06-16 DIAGNOSIS — H25811 Combined forms of age-related cataract, right eye: Secondary | ICD-10-CM | POA: Diagnosis not present

## 2017-06-16 DIAGNOSIS — H401131 Primary open-angle glaucoma, bilateral, mild stage: Secondary | ICD-10-CM | POA: Diagnosis not present

## 2017-06-17 DIAGNOSIS — H2511 Age-related nuclear cataract, right eye: Secondary | ICD-10-CM | POA: Diagnosis not present

## 2017-06-23 DIAGNOSIS — H25811 Combined forms of age-related cataract, right eye: Secondary | ICD-10-CM | POA: Diagnosis not present

## 2017-06-23 DIAGNOSIS — H2511 Age-related nuclear cataract, right eye: Secondary | ICD-10-CM | POA: Diagnosis not present

## 2017-09-10 NOTE — Progress Notes (Signed)
FOLLOW UP  Assessment and Plan:   Hypertension Controlled off of medications Monitor blood pressure at home; patient to call if consistently greater than 130/80 Continue DASH diet.   Reminder to go to the ER if any CP, SOB, nausea, dizziness, severe HA, changes vision/speech, left arm numbness and tingling and jaw pain.  Cholesterol Continue medication  Continue low cholesterol diet and exercise.  Check lipid panel.   History of Prediabetes Currently well controlled by lifestyle Discussed diet/exercise, weight management  Defer A1C as has been well controlled; BMP  Hypothyroidism continue medications the same reminded to take on an empty stomach 30-39mins before food.  check TSH level  Vitamin D Def At goal at recent check; continue to recommend supplementation for goal of 70-100 Defer vitamin D level   Continue diet and meds as discussed. Further disposition pending results of labs. Discussed med's effects and SE's.   Over 30 minutes of exam, counseling, chart review, and critical decision making was performed.   Future Appointments  Date Time Provider [place]  12/15/2017 11:00 AM Vicie Mutters, PA-C [facility] None    ----------------------------------------------------------------------------------------------------------------------  HPI 77 y.o. female  presents for 3 month follow up on hypertension, cholesterol, vitamin D deficiency and monitoring due to history of prediabetes currently well controlled by lifestyle.    BMI is Body mass index is 22.13 kg/m., she has been working on diet and exercise, doing heart healthy diet after her husband had MI with stent this past winter.  Wt Readings from Last 3 Encounters:  09/11/17 121 lb (54.9 kg)  05/27/17 122 lb 12.8 oz (55.7 kg)  02/26/17 124 lb 6.4 oz (56.4 kg)   Her blood pressure has been controlled at home, today their BP is BP: 134/76  She does not workout. She denies chest pain, shortness of  breath, dizziness.   She is on cholesterol medication (atorvastatin 40 mg three times weekly)  and denies myalgias. Her cholesterol is at goal. The cholesterol last visit was:   Lab Results  Component Value Date   CHOL 170 05/27/2017   HDL 46 (L) 05/27/2017   LDLCALC 100 (H) 05/27/2017   TRIG 147 05/27/2017   CHOLHDL 3.7 05/27/2017    She has been working on diet and exercise for personal history of glucose management, and denies increased appetite, nausea, paresthesia of the feet, polydipsia, polyuria, visual disturbances and vomiting. Last A1C in the office was:  Lab Results  Component Value Date   HGBA1C 5.0 06/13/2016   She is on thyroid medication. Her medication was not changed last visit.   Lab Results  Component Value Date   TSH 0.92 05/27/2017   Patient is on Vitamin D supplement and near goal at last check:    Lab Results  Component Value Date   VD25OH 73 02/26/2017        Current Medications:  Current Outpatient Medications on File Prior to Visit  Medication Sig  . aspirin 81 MG tablet Take 81 mg by mouth daily.  Marland Kitchen atorvastatin (LIPITOR) 40 MG tablet Take 1 pill 3 days a week  . latanoprost (XALATAN) 0.005 % ophthalmic solution Place 1 drop into both eyes at bedtime.  Marland Kitchen levothyroxine (SYNTHROID, LEVOTHROID) 100 MCG tablet TAKE ONE TABLET BY MOUTH ONCE DAILY  . OVER THE COUNTER MEDICATION OTC Fiber Gummies  OR   Benefiber.  Marland Kitchen VITAMIN D, CHOLECALCIFEROL, PO Take 2,000 Int'l Units by mouth.   . meclizine (ANTIVERT) 25 MG tablet Take 1 tablet (25 mg total) by  mouth 3 (three) times daily as needed for dizziness. (Patient not taking: Reported on 09/11/2017)  . Multiple Vitamins-Minerals (MULTIVITAMIN PO) Take by mouth daily.   No current facility-administered medications on file prior to visit.      Allergies:  Allergies  Allergen Reactions  . Neomycin-Bacitracin Zn-Polymyx     REACTION: rash  . Propoxyphene Hcl     REACTION: syncope  . Darvon Other (See  Comments)    Almost fainted when taking, over 50 years.     Medical History:  Past Medical History:  Diagnosis Date  . Allergy   . Cancer [facility]) 2010   Left Breast  . Dyspnea    chronic-no cardiac reason  . GERD (gastroesophageal reflux disease)   . Glaucoma   . History of thyroid cancer   . Hyperlipidemia   . Hypertension    no meds  . IBS (irritable bowel syndrome)   . Pre-diabetes   . Sciatica    RLE  . Thyroid disease    Hypo  . Vitamin D deficiency   . Wears glasses    Family history- Reviewed and unchanged Social history- Reviewed and unchanged   Review of Systems:  Review of Systems  Constitutional: Negative for chills, fever, malaise/fatigue and weight loss.  HENT: Negative for congestion, hearing loss, sore throat and tinnitus.   Eyes: Negative for blurred vision and double vision.  Respiratory: Negative for cough, shortness of breath and wheezing.   Cardiovascular: Negative for chest pain, palpitations, orthopnea, claudication and leg swelling.  Gastrointestinal: Negative for abdominal pain, blood in stool, constipation, diarrhea, heartburn, melena, nausea and vomiting.  Genitourinary: Negative.   Musculoskeletal: Positive for joint pain (Bilateral knees, needs TKA). Negative for myalgias.  Skin: Negative for rash.  Neurological: Negative for dizziness, tingling, sensory change, weakness and headaches.  Endo/Heme/Allergies: Negative for polydipsia.  Psychiatric/Behavioral: Negative.   All other systems reviewed and are negative.     Physical Exam: BP 134/76   Pulse (!) 56   Temp (!) 97.3 F (36.3 C)   Ht 5\' 2"  (1.575 m)   Wt 121 lb (54.9 kg)   SpO2 97%   BMI 22.13 kg/m  Wt Readings from Last 3 Encounters:  09/11/17 121 lb (54.9 kg)  05/27/17 122 lb 12.8 oz (55.7 kg)  02/26/17 124 lb 6.4 oz (56.4 kg)   General Appearance: Well nourished, in no apparent distress. Eyes: PERRLA, EOMs, conjunctiva no swelling or erythema Sinuses: No  Frontal/maxillary tenderness ENT/Mouth: Ext aud canals clear, TMs without erythema, bulging. Post pharynx mildly injected. No swelling, or exudate on post pharynx.  Tonsils not swollen or erythematous. Hearing normal.  Neck: Supple, thyroid normal.  Respiratory: Respiratory effort normal, BS equal bilaterally without rales, rhonchi, wheezing or stridor.  Cardio: RRR with no MRGs. Brisk peripheral pulses without edema.  Abdomen: Soft, + BS.  Non tender, no guarding, rebound, hernias, masses. Lymphatics: Non tender without lymphadenopathy.  Musculoskeletal: Full ROM, 5/5 strength, Normal gait Skin: Warm, dry without rashes, lesions, ecchymosis.  Neuro: Cranial nerves intact. No cerebellar symptoms.  Psych: Awake and oriented X 3, normal affect, Insight and Judgment appropriate.    Izora Ribas, NP 10:49 AM [facility]

## 2017-09-11 ENCOUNTER — Ambulatory Visit (INDEPENDENT_AMBULATORY_CARE_PROVIDER_SITE_OTHER): Payer: PPO | Admitting: Adult Health

## 2017-09-11 ENCOUNTER — Encounter: Payer: Self-pay | Admitting: Adult Health

## 2017-09-11 VITALS — BP 134/76 | HR 56 | Temp 97.3°F | Ht 62.0 in | Wt 121.0 lb

## 2017-09-11 DIAGNOSIS — Z6822 Body mass index (BMI) 22.0-22.9, adult: Secondary | ICD-10-CM

## 2017-09-11 DIAGNOSIS — E559 Vitamin D deficiency, unspecified: Secondary | ICD-10-CM

## 2017-09-11 DIAGNOSIS — E782 Mixed hyperlipidemia: Secondary | ICD-10-CM | POA: Diagnosis not present

## 2017-09-11 DIAGNOSIS — Z87898 Personal history of other specified conditions: Secondary | ICD-10-CM | POA: Diagnosis not present

## 2017-09-11 DIAGNOSIS — Z79899 Other long term (current) drug therapy: Secondary | ICD-10-CM | POA: Diagnosis not present

## 2017-09-11 DIAGNOSIS — I10 Essential (primary) hypertension: Secondary | ICD-10-CM

## 2017-09-11 DIAGNOSIS — E039 Hypothyroidism, unspecified: Secondary | ICD-10-CM

## 2017-09-11 LAB — CBC WITH DIFFERENTIAL/PLATELET
BASOS PCT: 1 %
Basophils Absolute: 72 cells/uL (ref 0–200)
EOS PCT: 1.1 %
Eosinophils Absolute: 79 cells/uL (ref 15–500)
HCT: 39.6 % (ref 35.0–45.0)
Hemoglobin: 13.4 g/dL (ref 11.7–15.5)
Lymphs Abs: 1922 cells/uL (ref 850–3900)
MCH: 29.8 pg (ref 27.0–33.0)
MCHC: 33.8 g/dL (ref 32.0–36.0)
MCV: 88.2 fL (ref 80.0–100.0)
MONOS PCT: 10 %
MPV: 9.7 fL (ref 7.5–12.5)
Neutro Abs: 4406 cells/uL (ref 1500–7800)
Neutrophils Relative %: 61.2 %
PLATELETS: 258 10*3/uL (ref 140–400)
RBC: 4.49 10*6/uL (ref 3.80–5.10)
RDW: 13 % (ref 11.0–15.0)
TOTAL LYMPHOCYTE: 26.7 %
WBC mixed population: 720 cells/uL (ref 200–950)
WBC: 7.2 10*3/uL (ref 3.8–10.8)

## 2017-09-11 LAB — LIPID PANEL
Cholesterol: 204 mg/dL — ABNORMAL HIGH (ref <200)
HDL: 55 mg/dL (ref >50)
LDL CHOLESTEROL (CALC): 124 mg/dL — AB
NON-HDL CHOLESTEROL (CALC): 149 mg/dL — AB (ref <130)
Total CHOL/HDL Ratio: 3.7 (calc) (ref <5.0)
Triglycerides: 133 mg/dL (ref <150)

## 2017-09-11 LAB — COMPLETE METABOLIC PANEL WITH GFR
AG Ratio: 1.7 (calc) (ref 1.0–2.5)
ALT: 8 U/L (ref 6–29)
AST: 15 U/L (ref 10–35)
Albumin: 4.4 g/dL (ref 3.6–5.1)
Alkaline phosphatase (APISO): 72 U/L (ref 33–130)
BUN/Creatinine Ratio: 11 (calc) (ref 6–22)
BUN: 11 mg/dL (ref 7–25)
CALCIUM: 9.6 mg/dL (ref 8.6–10.4)
CHLORIDE: 104 mmol/L (ref 98–110)
CO2: 29 mmol/L (ref 20–32)
Creat: 1.03 mg/dL — ABNORMAL HIGH (ref 0.60–0.93)
GFR, EST AFRICAN AMERICAN: 60 mL/min/{1.73_m2} (ref >=60)
GFR, Est Non African American: 52 mL/min/{1.73_m2} — ABNORMAL LOW (ref >=60)
GLUCOSE: 91 mg/dL (ref 65–99)
Globulin: 2.6 g/dL (calc) (ref 1.9–3.7)
Potassium: 5.1 mmol/L (ref 3.5–5.3)
Sodium: 140 mmol/L (ref 135–146)
Total Bilirubin: 0.5 mg/dL (ref 0.2–1.2)
Total Protein: 7 g/dL (ref 6.1–8.1)

## 2017-09-11 LAB — TSH: TSH: 1.9 mIU/L (ref 0.40–4.50)

## 2017-09-11 NOTE — Patient Instructions (Signed)
Tylenol is safe to take   Consider adding tumeric + bioprene (black pepper fruit) supplement - antiinflammatory that's safe for kidneys   Save ibuprofen/aleve for bad days    Arthritis Arthritis is a term that is commonly used to refer to joint pain or joint disease. There are more than 100 types of arthritis. What are the causes? The most common cause of this condition is wear and tear of a joint. Other causes include:  Gout.  Inflammation of a joint.  An infection of a joint.  Sprains and other injuries near the joint.  A drug reaction or allergic reaction.  In some cases, the cause may not be known. What are the signs or symptoms? The main symptom of this condition is pain in the joint with movement. Other symptoms include:  Redness, swelling, or stiffness at a joint.  Warmth coming from the joint.  Fever.  Overall feeling of illness.  How is this diagnosed? This condition may be diagnosed with a physical exam and tests, including:  Blood tests.  Urine tests.  Imaging tests, such as MRI, X-rays, or a CT scan.  Sometimes, fluid is removed from a joint for testing. How is this treated? Treatment for this condition may involve:  Treatment of the cause, if it is known.  Rest.  Raising (elevating) the joint.  Applying cold or hot packs to the joint.  Medicines to improve symptoms and reduce inflammation.  Injections of a steroid such as cortisone into the joint to help reduce pain and inflammation.  Depending on the cause of your arthritis, you may need to make lifestyle changes to reduce stress on your joint. These changes may include exercising more and losing weight. Follow these instructions at home: Medicines  Take over-the-counter and prescription medicines only as told by your health care provider.  Do not take aspirin to relieve pain if gout is suspected. Activity  Rest your joint if told by your health care provider. Rest is important when  your disease is active and your joint feels painful, swollen, or stiff.  Avoid activities that make the pain worse. It is important to balance activity with rest.  Exercise your joint regularly with range-of-motion exercises as told by your health care provider. Try doing low-impact exercise, such as: ? Swimming. ? Water aerobics. ? Biking. ? Walking. Joint Care   If your joint is swollen, keep it elevated if told by your health care provider.  If your joint feels stiff in the morning, try taking a warm shower.  If directed, apply heat to the joint. If you have diabetes, do not apply heat without permission from your health care provider. ? Put a towel between the joint and the hot pack or heating pad. ? Leave the heat on the area for 20-30 minutes.  If directed, apply ice to the joint: ? Put ice in a plastic bag. ? Place a towel between your skin and the bag. ? Leave the ice on for 20 minutes, 2-3 times per day.  Keep all follow-up visits as told by your health care provider. This is important. Contact a health care provider if:  The pain gets worse.  You have a fever. Get help right away if:  You develop severe joint pain, swelling, or redness.  Many joints become painful and swollen.  You develop severe back pain.  You develop severe weakness in your leg.  You cannot control your bladder or bowels. This information is not intended to replace advice given  to you by your health care provider. Make sure you discuss any questions you have with your health care provider. Document Released: 04/11/2004 Document Revised: 08/10/2015 Document Reviewed: 05/30/2014 Elsevier Interactive Patient Education  [employer].

## 2017-11-13 ENCOUNTER — Encounter: Payer: Self-pay | Admitting: Physician Assistant

## 2017-11-18 ENCOUNTER — Encounter: Payer: Self-pay | Admitting: Physician Assistant

## 2017-11-18 DIAGNOSIS — M1712 Unilateral primary osteoarthritis, left knee: Secondary | ICD-10-CM | POA: Diagnosis not present

## 2017-11-18 DIAGNOSIS — M1711 Unilateral primary osteoarthritis, right knee: Secondary | ICD-10-CM | POA: Diagnosis not present

## 2017-12-14 DIAGNOSIS — J449 Chronic obstructive pulmonary disease, unspecified: Secondary | ICD-10-CM | POA: Insufficient documentation

## 2017-12-14 NOTE — Progress Notes (Signed)
COMPLETE PHYSICAL  Assessment:    HYPOTHYROIDISM Hypothyroidism-check TSH level, continue medications the same, reminded to take on an empty stomach 30-107mins before food.  - TSH   Pre-diabetes  at goal  OSTEOPENIA Due for DEXA 2 next year, cont vitamin D   HYPERLIPIDEMIA  -continue medications, check lipids, decrease fatty foods, increase activity.  - CBC with Differential - Hepatic function panel - BASIC METABOLIC PANEL WITH GFR - Lipid panel   Allergy  Vitamin D deficiency - Vit D  25 hydroxy (rtn osteoporosis monitoring)  Encounter for long-term (current) use of other medications - Magnesium  History of breast cancer Continue MGM  Essential hypertension -     CBC with Differential/Platelet -     BASIC METABOLIC PANEL WITH GFR -     Hepatic function panel -     TSH  Thyroid Cancer,Hx Continue thyroid replacement  Gastroesophageal reflux disease, esophagitis presence not specified Continue PPI/H2 blocker, diet discussed  Other irritable bowel syndrome If not on benefiber then add it, decrease stress,  if any worsening symptoms, blood in stool, AB pain, etc call office  Future Appointments  Date Time Provider [place]  12/17/2018 10:00 AM Vicie Mutters, PA-C [facility] None     Subjective:   Haley Shah is a 77 y.o. female who presents for PHYSICAL and 3 month /fu HTN, chol, preDM.  Her blood pressure has been controlled at home, today their BP is BP: 122/76 She does workout. She denies chest pain, shortness of breath, dizziness.  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  Lab Results  Component Value Date   GFRNONAA 52 (L) 09/11/2017   She is on cholesterol medication, lipitor 80 1/2 3 days a week RX reflects this. .Her cholesterol is not at goal. The cholesterol last visit was:   Lab Results  Component Value Date   CHOL 204 (H) 09/11/2017   HDL 55 09/11/2017   LDLCALC 124 (H) 09/11/2017   TRIG 133 09/11/2017   CHOLHDL 3.7 09/11/2017     Last A1C  in the office was:  Lab Results  Component Value Date   HGBA1C 5.0 06/13/2016  Has history of left breast cancer in 2009, gets regular visit.  Patient is on Vitamin D supplement. Lab Results  Component Value Date   VD25OH 73 02/26/2017   She has benign essential tremor.  She is on thyroid medication. Her medication was changed last visit, she is on 1/2 pill rest of the week and 1 pill M,W,F.  Patient denies nervousness and palpitations.  Lab Results  Component Value Date   TSH 1.90 09/11/2017   BMI is Body mass index is 23.24 kg/m., she is working on diet and exercise. Wt Readings from Last 3 Encounters:  12/15/17 125 lb (56.7 kg)  09/11/17 121 lb (54.9 kg)  05/27/17 122 lb 12.8 oz (55.7 kg)    Names of Other Physician/Practitioners you currently use: 1. [facility]- here for primary care 2. Dr. Katy Fitch, eye doctor, has appointment every 6 months 3.  Dr. Harmon Pier , dentist, last visit 6 months ago Patient Care Team: Unk Pinto, MD as PCP - General (Internal Medicine)  Medication Review Current Outpatient Medications on File Prior to Visit  Medication Sig Dispense Refill  . aspirin 81 MG tablet Take 81 mg by mouth daily.    Marland Kitchen atorvastatin (LIPITOR) 40 MG tablet Take 1 pill 3 days a week 36 tablet 5  . latanoprost (XALATAN) 0.005 % ophthalmic solution Place 1  drop into both eyes at bedtime.    Marland Kitchen levothyroxine (SYNTHROID, LEVOTHROID) 100 MCG tablet TAKE ONE TABLET BY MOUTH ONCE DAILY 90 tablet 3  . meclizine (ANTIVERT) 25 MG tablet Take 1 tablet (25 mg total) by mouth 3 (three) times daily as needed for dizziness. 90 tablet 0  . Multiple Vitamins-Minerals (MULTIVITAMIN PO) Take by mouth daily.    Marland Kitchen OVER THE COUNTER MEDICATION OTC Fiber Gummies  OR   Benefiber.    Marland Kitchen VITAMIN D, CHOLECALCIFEROL, PO Take 2,000 Int'l Units by mouth.      No current facility-administered medications on file prior to visit.     Current Problems  (verified) Patient Active Problem List   Diagnosis Date Noted  . COPD (chronic obstructive pulmonary disease) ([place]) 12/14/2017  . Essential hypertension 10/27/2013  . Medication management 04/23/2013  . History of prediabetes   . Vitamin D deficiency   . History of left breast cancer 11/03/2012  . Hypothyroidism 03/22/2008  . Hyperlipidemia 03/22/2008  . G E R D 03/22/2008  . IBS 03/22/2008  . Vertigo 03/22/2008  . Thyroid Cancer,Hx 03/22/2008    Screening Tests Immunization History  Administered Date(s) Administered  . Influenza, High Dose Seasonal PF 01/05/2013, 01/05/2015, 11/27/2015, 12/20/2016  . Pneumococcal Conjugate-13 03/02/2015  . Pneumococcal Polysaccharide-23 03/18/2008  . Td 03/18/2005   Preventative care: Last colonoscopy: 2011 due 10 years Last mammogram: 07/2016, CAT B scheduled Last pap smear/pelvic exam: 2012 declines another DEXA: 12/2016, ostoepenia Echo 2010  Prior vaccinations: TD or Tdap: 2007 declines Influenza: 2019 Pneumococcal: 2010 Prevnar 13: 2016 Shingles/Zostavax: declines due to cost  Allergies Allergies  Allergen Reactions  . Neomycin-Bacitracin Zn-Polymyx     REACTION: rash  . Propoxyphene Hcl     REACTION: syncope  . Darvon Other (See Comments)    Almost fainted when taking, over 50 years.    SURGICAL HISTORY She  has a past surgical history that includes Thyroid surgery (1986); Breast lumpectomy (2010); Colonoscopy; Cardiac catheterization (2006); Knee arthroscopy with medial menisectomy (Left, 05/25/2014); Knee arthroscopy with lateral menisectomy (Left, 05/25/2014); and Chondroplasty (Left, 05/25/2014). FAMILY HISTORY Her family history includes Cancer in her father; Cancer - Ovarian in her sister; Colon cancer in her father; Stroke in her father. SOCIAL HISTORY She  reports that she has never smoked. She has never used smokeless tobacco. She reports that she does not drink alcohol or use drugs.   Review of Systems   Constitutional: Negative.   HENT: Negative.   Eyes: Negative.   Respiratory: Negative.   Cardiovascular: Negative.   Gastrointestinal: Negative.   Genitourinary: Negative.   Musculoskeletal: Negative.   Skin: Negative.      Objective:   Blood pressure 122/76, pulse 66, temperature 97.8 F (36.6 C), resp. rate 12, height 5' 1.5" (1.562 m), weight 125 lb (56.7 kg), SpO2 96 %. Body mass index is 23.24 kg/m.  General appearance: alert, no distress, WD/WN,  female HEENT: normocephalic, sclerae anicteric, TMs pearly, nares patent, no discharge or erythema, pharynx normal Oral cavity: MMM, no lesions Neck: supple, no lymphadenopathy, no thyromegaly, no masses Heart: RRR, normal S1, S2, no murmurs Lungs: CTA bilaterally, no wheezes, rhonchi, or rales Abdomen: +bs, soft, non tender, non distended, no masses, no hepatomegaly, no splenomegaly Musculoskeletal: nontender, no swelling, no obvious deformity. Patient is able to ambulate well.  Extremities: no edema, no cyanosis, no clubbing Pulses: 2+ symmetric, upper and lower extremities, normal cap refill Neurological: alert, oriented x 3, CN2-12 intact, strength normal upper extremities and lower extremities,  sensation normal throughout, DTRs 2+ throughout, no cerebellar signs, gait normal Psychiatric: normal affect, behavior normal, pleasant     Vicie Mutters, PA-C   12/15/2017

## 2017-12-15 ENCOUNTER — Ambulatory Visit (INDEPENDENT_AMBULATORY_CARE_PROVIDER_SITE_OTHER): Payer: PPO | Admitting: Physician Assistant

## 2017-12-15 ENCOUNTER — Encounter: Payer: Self-pay | Admitting: Physician Assistant

## 2017-12-15 VITALS — BP 122/76 | HR 66 | Temp 97.8°F | Resp 12 | Ht 61.5 in | Wt 125.0 lb

## 2017-12-15 DIAGNOSIS — R42 Dizziness and giddiness: Secondary | ICD-10-CM

## 2017-12-15 DIAGNOSIS — Z79899 Other long term (current) drug therapy: Secondary | ICD-10-CM

## 2017-12-15 DIAGNOSIS — Z0000 Encounter for general adult medical examination without abnormal findings: Secondary | ICD-10-CM

## 2017-12-15 DIAGNOSIS — Z136 Encounter for screening for cardiovascular disorders: Secondary | ICD-10-CM

## 2017-12-15 DIAGNOSIS — I10 Essential (primary) hypertension: Secondary | ICD-10-CM

## 2017-12-15 DIAGNOSIS — Z23 Encounter for immunization: Secondary | ICD-10-CM

## 2017-12-15 DIAGNOSIS — Z87898 Personal history of other specified conditions: Secondary | ICD-10-CM

## 2017-12-15 DIAGNOSIS — K588 Other irritable bowel syndrome: Secondary | ICD-10-CM

## 2017-12-15 DIAGNOSIS — J449 Chronic obstructive pulmonary disease, unspecified: Secondary | ICD-10-CM

## 2017-12-15 DIAGNOSIS — E039 Hypothyroidism, unspecified: Secondary | ICD-10-CM

## 2017-12-15 DIAGNOSIS — K219 Gastro-esophageal reflux disease without esophagitis: Secondary | ICD-10-CM

## 2017-12-15 DIAGNOSIS — E782 Mixed hyperlipidemia: Secondary | ICD-10-CM

## 2017-12-15 DIAGNOSIS — Z85850 Personal history of malignant neoplasm of thyroid: Secondary | ICD-10-CM

## 2017-12-15 DIAGNOSIS — E559 Vitamin D deficiency, unspecified: Secondary | ICD-10-CM

## 2017-12-15 DIAGNOSIS — Z853 Personal history of malignant neoplasm of breast: Secondary | ICD-10-CM

## 2017-12-15 NOTE — Patient Instructions (Signed)
FIBER SUPPLEMENT  Benefiber or Citracel is good for constipation/diarrhea/irritable bowel syndrome, it helps with weight loss and can help lower your bad cholesterol. Please do 1 TBSP in the morning in water, coffee, or tea. It can take up to a month before you can see a difference with your bowel movements. It is cheapest from [employer], [employer], [employer].   Being a woman you may not have the typical symptoms of a heart attack.  You may not have any pain OR you may have atypical pain such as jaw pain, upper back pain, arm pain, "my bra feels to tight" and you will often have symptoms with it like below.  Symptoms for a heart attack will likely occur when you exert your self or exercise and include: Shortness of breath Sweating Nausea Dizziness Fast or irregular heart beats Fatigue   It makes me feel better if my patients get their heart rate up with exercise once or twice a week and pay close attention to your body. If there is ANY change in your exercise capacity or if you have symptoms above, please STOP and call 911 or call to come to the office.   Here is some information to help you keep your heart healthy: Move it! - Aim for 30 mins of activity every day. Take it slowly at first. Talk to [place] before starting any new exercise program.   Lose it.  -Body Mass Index (BMI) can indicate if you need to lose weight. A healthy range is 18.5-24.9. For a BMI calculator, go to [employer]  Waist Management -Excess abdominal fat is a risk factor for heart disease, diabetes, asthma, stroke and more. Ideal waist circumference is less than 35" for women and less than 40" for men.   Eat Right -focus on fruits, vegetables, whole grains, and meals you make yourself. Avoid foods with trans fat and high sugar/sodium content.   Snooze or Snore? - Loud snoring can be a sign of sleep apnea, a significant risk factor for high blood pressure, heart attach, stroke, and heart arrhythmias.  Kick the  habit -Quit Smoking! Avoid second hand smoke. A single cigarette raises your blood pressure for 20 mins and increases the risk of heart attack and stroke for the next 24 hours.   Are Aspirin and Supplements right for you? -Add ENTERIC COATED low dose 81 mg Aspirin daily OR can do every other day if you have easy bruising to protect your heart and head. As well as to reduce risk of Colon Cancer by 20 %, Skin Cancer by 26 % , Melanoma by 46% and Pancreatic cancer by 60%  Say "No to Stress -There may be little you can do about problems that cause stress. However, techniques such as long walks, meditation, and exercise can help you manage it.   Start Now! - Make changes one at a time and set reasonable goals to increase your likelihood of success.

## 2017-12-16 LAB — CBC WITH DIFFERENTIAL/PLATELET
BASOS ABS: 46 {cells}/uL (ref 0–200)
Basophils Relative: 0.6 %
EOS PCT: 0.8 %
Eosinophils Absolute: 62 cells/uL (ref 15–500)
HEMATOCRIT: 42 % (ref 35.0–45.0)
HEMOGLOBIN: 14.1 g/dL (ref 11.7–15.5)
Lymphs Abs: 1671 cells/uL (ref 850–3900)
MCH: 29.9 pg (ref 27.0–33.0)
MCHC: 33.6 g/dL (ref 32.0–36.0)
MCV: 89.2 fL (ref 80.0–100.0)
MPV: 9.6 fL (ref 7.5–12.5)
Monocytes Relative: 8.8 %
NEUTROS ABS: 5244 {cells}/uL (ref 1500–7800)
Neutrophils Relative %: 68.1 %
Platelets: 252 10*3/uL (ref 140–400)
RBC: 4.71 10*6/uL (ref 3.80–5.10)
RDW: 13.3 % (ref 11.0–15.0)
Total Lymphocyte: 21.7 %
WBC mixed population: 678 cells/uL (ref 200–950)
WBC: 7.7 10*3/uL (ref 3.8–10.8)

## 2017-12-16 LAB — MICROALBUMIN / CREATININE URINE RATIO
Creatinine, Urine: 12 mg/dL — ABNORMAL LOW (ref 20–275)
Microalb, Ur: <0.2 mg/dL

## 2017-12-16 LAB — LIPID PANEL
Cholesterol: 204 mg/dL — ABNORMAL HIGH (ref <200)
HDL: 62 mg/dL (ref >50)
LDL CHOLESTEROL (CALC): 123 mg/dL — AB
NON-HDL CHOLESTEROL (CALC): 142 mg/dL — AB (ref <130)
Total CHOL/HDL Ratio: 3.3 (calc) (ref <5.0)
Triglycerides: 93 mg/dL (ref <150)

## 2017-12-16 LAB — COMPLETE METABOLIC PANEL WITH GFR
AG Ratio: 1.9 (calc) (ref 1.0–2.5)
ALT: 9 U/L (ref 6–29)
AST: 16 U/L (ref 10–35)
Albumin: 4.6 g/dL (ref 3.6–5.1)
Alkaline phosphatase (APISO): 69 U/L (ref 33–130)
BUN: 14 mg/dL (ref 7–25)
CHLORIDE: 103 mmol/L (ref 98–110)
CO2: 31 mmol/L (ref 20–32)
Calcium: 9.4 mg/dL (ref 8.6–10.4)
Creat: 0.91 mg/dL (ref 0.60–0.93)
GFR, EST AFRICAN AMERICAN: 70 mL/min/{1.73_m2} (ref >=60)
GFR, Est Non African American: 60 mL/min/{1.73_m2} (ref >=60)
GLOBULIN: 2.4 g/dL (ref 1.9–3.7)
Glucose, Bld: 85 mg/dL (ref 65–99)
Potassium: 4.6 mmol/L (ref 3.5–5.3)
SODIUM: 141 mmol/L (ref 135–146)
TOTAL PROTEIN: 7 g/dL (ref 6.1–8.1)
Total Bilirubin: 0.5 mg/dL (ref 0.2–1.2)

## 2017-12-16 LAB — URINALYSIS, ROUTINE W REFLEX MICROSCOPIC
BACTERIA UA: NONE SEEN /HPF
BILIRUBIN URINE: NEGATIVE
Glucose, UA: NEGATIVE
Hyaline Cast: NONE SEEN /LPF
KETONES UR: NEGATIVE
LEUKOCYTES UA: NEGATIVE
NITRITE: NEGATIVE
PROTEIN: NEGATIVE
RBC / HPF: NONE SEEN /HPF (ref 0–2)
Specific Gravity, Urine: 1.005 (ref 1.001–1.03)
Squamous Epithelial / LPF: NONE SEEN /HPF (ref <=5)
WBC, UA: NONE SEEN /HPF (ref 0–5)
pH: 6.5 (ref 5.0–8.0)

## 2017-12-16 LAB — HEMOGLOBIN A1C
EAG (MMOL/L): 6 (calc)
Hgb A1c MFr Bld: 5.4 % of total Hgb (ref <5.7)
MEAN PLASMA GLUCOSE: 108 (calc)

## 2017-12-16 LAB — TSH: TSH: 0.88 mIU/L (ref 0.40–4.50)

## 2018-01-21 DIAGNOSIS — Z1231 Encounter for screening mammogram for malignant neoplasm of breast: Secondary | ICD-10-CM | POA: Diagnosis not present

## 2018-01-21 DIAGNOSIS — Z853 Personal history of malignant neoplasm of breast: Secondary | ICD-10-CM | POA: Diagnosis not present

## 2018-01-21 LAB — HM MAMMOGRAPHY

## 2018-02-23 ENCOUNTER — Encounter: Payer: Self-pay | Admitting: *Deleted

## 2018-03-17 ENCOUNTER — Other Ambulatory Visit: Payer: Self-pay | Admitting: Internal Medicine

## 2018-03-17 MED ORDER — PREDNISONE 20 MG PO TABS: ORAL_TABLET | ORAL | 0 refills | Status: DC

## 2018-03-25 ENCOUNTER — Encounter: Payer: Self-pay | Admitting: Physician Assistant

## 2018-03-25 ENCOUNTER — Ambulatory Visit (INDEPENDENT_AMBULATORY_CARE_PROVIDER_SITE_OTHER): Payer: PPO | Admitting: Physician Assistant

## 2018-03-25 VITALS — BP 134/72 | HR 68 | Temp 97.4°F | Ht 61.5 in | Wt 127.2 lb

## 2018-03-25 DIAGNOSIS — K219 Gastro-esophageal reflux disease without esophagitis: Secondary | ICD-10-CM | POA: Diagnosis not present

## 2018-03-25 DIAGNOSIS — K588 Other irritable bowel syndrome: Secondary | ICD-10-CM

## 2018-03-25 DIAGNOSIS — Z79899 Other long term (current) drug therapy: Secondary | ICD-10-CM

## 2018-03-25 DIAGNOSIS — Z85850 Personal history of malignant neoplasm of thyroid: Secondary | ICD-10-CM | POA: Diagnosis not present

## 2018-03-25 DIAGNOSIS — R6889 Other general symptoms and signs: Secondary | ICD-10-CM

## 2018-03-25 DIAGNOSIS — R42 Dizziness and giddiness: Secondary | ICD-10-CM

## 2018-03-25 DIAGNOSIS — I10 Essential (primary) hypertension: Secondary | ICD-10-CM | POA: Diagnosis not present

## 2018-03-25 DIAGNOSIS — Z0000 Encounter for general adult medical examination without abnormal findings: Secondary | ICD-10-CM

## 2018-03-25 DIAGNOSIS — Z853 Personal history of malignant neoplasm of breast: Secondary | ICD-10-CM | POA: Diagnosis not present

## 2018-03-25 DIAGNOSIS — Z87898 Personal history of other specified conditions: Secondary | ICD-10-CM | POA: Diagnosis not present

## 2018-03-25 DIAGNOSIS — J449 Chronic obstructive pulmonary disease, unspecified: Secondary | ICD-10-CM

## 2018-03-25 DIAGNOSIS — Z0001 Encounter for general adult medical examination with abnormal findings: Secondary | ICD-10-CM

## 2018-03-25 DIAGNOSIS — J309 Allergic rhinitis, unspecified: Secondary | ICD-10-CM | POA: Diagnosis not present

## 2018-03-25 DIAGNOSIS — E559 Vitamin D deficiency, unspecified: Secondary | ICD-10-CM | POA: Diagnosis not present

## 2018-03-25 DIAGNOSIS — E039 Hypothyroidism, unspecified: Secondary | ICD-10-CM

## 2018-03-25 DIAGNOSIS — E782 Mixed hyperlipidemia: Secondary | ICD-10-CM | POA: Diagnosis not present

## 2018-03-25 MED ORDER — AZELASTINE HCL 0.1 % NA SOLN: 2.0000 | Freq: Two times a day (BID) | NASAL | 2 refills | Status: DC

## 2018-03-25 MED ORDER — AZITHROMYCIN 250 MG PO TABS: ORAL_TABLET | ORAL | 1 refills | Status: AC

## 2018-03-25 NOTE — Patient Instructions (Addendum)
Get on the astelin nasal spray If you are not getting better, if you have facial pain, sinus pressure, fever, chills, teeth pain start on the antibiotic, otherwise hold on to it  Allergic Rhinitis, Adult Allergic rhinitis is an allergic reaction that affects the mucous membrane inside the nose. It causes sneezing, a runny or stuffy nose, and the feeling of mucus going down the back of the throat (postnasal drip). Allergic rhinitis can be mild to severe. There are two types of allergic rhinitis:  Seasonal. This type is also called hay fever. It happens only during certain seasons.  Perennial. This type can happen at any time of the year. What are the causes? This condition happens when the body's defense system (immune system) responds to certain harmless substances called allergens as though they were germs.  Seasonal allergic rhinitis is triggered by pollen, which can come from grasses, trees, and weeds. Perennial allergic rhinitis may be caused by:  House dust mites.  Pet dander.  Mold spores. What are the signs or symptoms? Symptoms of this condition include:  Sneezing.  Runny or stuffy nose (nasal congestion).  Postnasal drip.  Itchy nose.  Tearing of the eyes.  Trouble sleeping.  Daytime sleepiness. How is this diagnosed? This condition may be diagnosed based on:  Your medical history.  A physical exam.  Tests to check for related conditions, such as: ? Asthma. ? Pink eye. ? Ear infection. ? Upper respiratory infection.  Tests to find out which allergens trigger your symptoms. These may include skin or blood tests. How is this treated? There is no cure for this condition, but treatment can help control symptoms. Treatment may include:  Taking medicines that block allergy symptoms, such as antihistamines. Medicine may be given as a shot, nasal spray, or pill.  Avoiding the allergen.  Desensitization. This treatment involves getting ongoing shots until your  body becomes less sensitive to the allergen. This treatment may be done if other treatments do not help.  If taking medicine and avoiding the allergen does not work, new, stronger medicines may be prescribed. Follow these instructions at home:  Find out what you are allergic to. Common allergens include smoke, dust, and pollen.  Avoid the things you are allergic to. These are some things you can do to help avoid allergens: ? Replace carpet with wood, tile, or vinyl flooring. Carpet can trap dander and dust. ? Do not smoke. Do not allow smoking in your home. ? Change your heating and air conditioning filter at least once a month. ? During allergy season:  Keep windows closed as much as possible.  Plan outdoor activities when pollen counts are lowest. This is usually during the evening hours.  When coming indoors, change clothing and shower before sitting on furniture or bedding.  Take over-the-counter and prescription medicines only as told by your health care provider.  Keep all follow-up visits as told by your health care provider. This is important. Contact a health care provider if:  You have a fever.  You develop a persistent cough.  You make whistling sounds when you breathe (you wheeze).  Your symptoms interfere with your normal daily activities. Get help right away if:  You have shortness of breath. Summary  This condition can be managed by taking medicines as directed and avoiding allergens.  Contact your health care provider if you develop a persistent cough or fever.  During allergy season, keep windows closed as much as possible. This information is not intended to replace  advice given to you by your health care provider. Make sure you discuss any questions you have with your health care provider. Document Released: 11/27/2000 Document Revised: 04/11/2016 Document Reviewed: 04/11/2016 Elsevier Interactive Patient Education  2019 [employer].

## 2018-03-25 NOTE — Progress Notes (Signed)
WELLNESS AND ACUTE VISIT  Assessment:   Allergic rhinitis Will hold the zpak and take if she is not getting better, increase fluids, rest, cont allergy pill, get the astelin   HYPOTHYROIDISM Hypothyroidism-check TSH level, continue medications the same, reminded to take on an empty stomach 30-36mins before food.  - TSH   Pre-diabetes  at goal  OSTEOPENIA Due for DEXA 2 next year, cont vitamin D   HYPERLIPIDEMIA  -continue medications, check lipids, decrease fatty foods, increase activity.  - CBC with Differential - Hepatic function panel - BASIC METABOLIC PANEL WITH GFR - Lipid panel   Allergy  Vitamin D deficiency - Vit D  25 hydroxy (rtn osteoporosis monitoring)  Encounter for long-term (current) use of other medications - Magnesium  History of breast cancer Continue MGM  Essential hypertension -     CBC with Differential/Platelet -     BASIC METABOLIC PANEL WITH GFR -     Hepatic function panel -     TSH  Thyroid Cancer,Hx Continue thyroid replacement  Gastroesophageal reflux disease, esophagitis presence not specified Continue PPI/H2 blocker, diet discussed  Other irritable bowel syndrome If not on benefiber then add it, decrease stress,  if any worsening symptoms, blood in stool, AB pain, etc call office  Future Appointments  Date Time Provider [place]  06/26/2018 10:30 AM Unk Pinto, MD [facility] None  12/17/2018 10:00 AM Vicie Mutters, PA-C [facility] None     Subjective:   Haley Shah is a 78 y.o. female who presents for wellness and acute visit for URI.   She started Dec 27th with cold symptoms, she started prednisone, allergy pill and was on coricidin x the 31st, she has 2 pills left of the prednisone. She continues to have clear mucus, very runny nose.  She denies facial pain, teeth pain but states yesterday her head all over hurt. No sore throat, trouble swallowing, no fever, chills. No cough, SOB, CP.   She has been  upset, elderly friend is dying, trying to help set up funeral.   Her blood pressure has been controlled at home, today their BP is BP: 134/72 She does workout. She denies chest pain, shortness of breath, dizziness.  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  Lab Results  Component Value Date   GFRNONAA 60 12/15/2017   She is on cholesterol medication, lipitor 80 1/2 3 days a week RX reflects this. .Her cholesterol is not at goal. The cholesterol last visit was:   Lab Results  Component Value Date   CHOL 204 (H) 12/15/2017   HDL 62 12/15/2017   LDLCALC 123 (H) 12/15/2017   TRIG 93 12/15/2017   CHOLHDL 3.3 12/15/2017     Last A1C in the office was:  Lab Results  Component Value Date   HGBA1C 5.4 12/15/2017  Has history of left breast cancer in 2009, gets regular visit.  Patient is on Vitamin D supplement. Lab Results  Component Value Date   VD25OH 73 02/26/2017   She has benign essential tremor.  She is on thyroid medication. Her medication was changed last visit, she is on 1/2 pill rest of the week and 1 pill M,W,F.  Patient denies nervousness and palpitations.  Lab Results  Component Value Date   TSH 0.88 12/15/2017   BMI is Body mass index is 23.65 kg/m., she is working on diet and exercise. Wt Readings from Last 3 Encounters:  03/25/18 127 lb 3.2 oz (57.7 kg)  12/15/17 125 lb (56.7 kg)  09/11/17 121  lb (54.9 kg)    Names of Other Physician/Practitioners you currently use: 1. [facility]- here for primary care 2. Dr. Katy Fitch, eye doctor, has appointment every 6 months 3.  Dr. Harmon Pier , dentist, last visit 6 months ago Patient Care Team: Unk Pinto, MD as PCP - General (Internal Medicine)  Medication Review Current Outpatient Medications on File Prior to Visit  Medication Sig Dispense Refill  . aspirin 81 MG tablet Take 81 mg by mouth daily.    Marland Kitchen atorvastatin (LIPITOR) 40 MG tablet Take 1 pill 3 days a week 36 tablet 5  . latanoprost  (XALATAN) 0.005 % ophthalmic solution Place 1 drop into both eyes at bedtime.    Marland Kitchen levothyroxine (SYNTHROID, LEVOTHROID) 100 MCG tablet TAKE ONE TABLET BY MOUTH ONCE DAILY 90 tablet 3  . meclizine (ANTIVERT) 25 MG tablet Take 1 tablet (25 mg total) by mouth 3 (three) times daily as needed for dizziness. 90 tablet 0  . Multiple Vitamins-Minerals (MULTIVITAMIN PO) Take by mouth daily.    Marland Kitchen OVER THE COUNTER MEDICATION OTC Fiber Gummies  OR   Benefiber.    . predniSONE (DELTASONE) 20 MG tablet 1 tab 3 x day for 3 days, then 1 tab 2 x day for 3 days, then 1 tab 1 x day for 5 days 20 tablet 0  . VITAMIN D, CHOLECALCIFEROL, PO Take 2,000 Int'l Units by mouth.      No current facility-administered medications on file prior to visit.     Current Problems (verified) Patient Active Problem List   Diagnosis Date Noted  . COPD (chronic obstructive pulmonary disease) ([place]) 12/14/2017  . Essential hypertension 10/27/2013  . Medication management 04/23/2013  . History of prediabetes   . Vitamin D deficiency   . History of left breast cancer 11/03/2012  . Hypothyroidism 03/22/2008  . Hyperlipidemia 03/22/2008  . G E R D 03/22/2008  . IBS 03/22/2008  . Vertigo 03/22/2008  . Thyroid Cancer,Hx 03/22/2008    Screening Tests Immunization History  Administered Date(s) Administered  . Influenza, High Dose Seasonal PF 01/05/2013, 01/05/2015, 11/27/2015, 12/20/2016, 12/15/2017  . Pneumococcal Conjugate-13 03/02/2015  . Pneumococcal Polysaccharide-23 03/18/2008  . Td 03/18/2005   Preventative care: Last colonoscopy: 2011 due 10 years due next year Last mammogram: 01/2018, CAT B scheduled Last pap smear/pelvic exam: 2012 declines another DEXA: 12/2016, ostoepenia due next year Echo 2010  Prior vaccinations: TD or Tdap: 2007 declines Influenza: 2019 Pneumococcal: 2010 Prevnar 13: 2016 Shingles/Zostavax: declines due to cost  Allergies Allergies  Allergen Reactions  . Neomycin-Bacitracin  Zn-Polymyx     REACTION: rash  . Propoxyphene Hcl     REACTION: syncope  . Darvon Other (See Comments)    Almost fainted when taking, over 50 years.    SURGICAL HISTORY She  has a past surgical history that includes Thyroid surgery (1986); Breast lumpectomy (2010); Colonoscopy; Cardiac catheterization (2006); Knee arthroscopy with medial menisectomy (Left, 05/25/2014); Knee arthroscopy with lateral menisectomy (Left, 05/25/2014); and Chondroplasty (Left, 05/25/2014). FAMILY HISTORY Her family history includes Cancer in her father; Cancer - Ovarian in her sister; Colon cancer in her father; Stroke in her father. SOCIAL HISTORY She  reports that she has never smoked. She has never used smokeless tobacco. She reports that she does not drink alcohol or use drugs.  MEDICARE WELLNESS OBJECTIVES: Physical activity: Current Exercise Habits: Home exercise routine, Type of exercise: walking, Intensity: Mild Cardiac risk factors: Cardiac Risk Factors include: advanced age (>92men, >81 women);hypertension;sedentary lifestyle Depression/mood  screen:   Depression screen [facility] 2/9 03/25/2018  Decreased Interest 0  Down, Depressed, Hopeless 0  PHQ - 2 Score 0    ADLs:  In your present state of health, do you have any difficulty performing the following activities: 03/25/2018 05/27/2017  Hearing? N N  Vision? N N  Difficulty concentrating or making decisions? N N  Walking or climbing stairs? N N  Dressing or bathing? N N  Doing errands, shopping? N N  Some recent data might be hidden     Cognitive Testing  Alert? Yes  Normal Appearance?Yes  Oriented to person? Yes  Place? Yes   Time? Yes  Recall of three objects?  Yes  Can perform simple calculations? Yes  Displays appropriate judgment?Yes  Can read the correct time from a watch face?Yes  EOL planning: Does Patient Have a Medical Advance Directive?: Yes Type of Advance Directive: Healthcare Power of Attorney, Living will Does patient want to make  changes to medical advance directive?: No - Patient declined    Review of Systems  Constitutional: Negative.   HENT: Positive for congestion. Negative for ear discharge, ear pain, hearing loss, nosebleeds, sinus pain, sore throat and tinnitus.   Eyes: Negative.   Respiratory: Negative.  Negative for cough, shortness of breath and stridor.   Cardiovascular: Negative.   Gastrointestinal: Negative.   Genitourinary: Negative.   Musculoskeletal: Negative.   Skin: Negative.      Objective:   Blood pressure 134/72, pulse 68, temperature (!) 97.4 F (36.3 C), height 5' 1.5" (1.562 m), weight 127 lb 3.2 oz (57.7 kg), SpO2 97 %. Body mass index is 23.65 kg/m.  General appearance: alert, no distress, WD/WN,  female HEENT: normocephalic, sclerae anicteric, TMs pearly, nares patent, no discharge or erythema, pharynx normal Oral cavity: MMM, no lesions Neck: supple, no lymphadenopathy, no thyromegaly, no masses Heart: RRR, normal S1, S2, no murmurs Lungs: CTA bilaterally, no wheezes, rhonchi, or rales Abdomen: +bs, soft, non tender, non distended, no masses, no hepatomegaly, no splenomegaly Musculoskeletal: nontender, no swelling, no obvious deformity. Patient is able to ambulate well.  Extremities: no edema, no cyanosis, no clubbing Pulses: 2+ symmetric, upper and lower extremities, normal cap refill Neurological: alert, oriented x 3, CN2-12 intact, strength normal upper extremities and lower extremities, sensation normal throughout, DTRs 2+ throughout, no cerebellar signs, gait normal Psychiatric: normal affect, behavior normal, pleasant    Medicare Attestation I have personally reviewed: The patient's medical and social history Their use of alcohol, tobacco or illicit drugs Their current medications and supplements The patient's functional ability including ADLs,fall risks, home safety risks, cognitive, and hearing and visual impairment Diet and physical activities Evidence for  depression or mood disorders  The patient's weight, height, BMI, and visual acuity have been recorded in the chart.  I have made referrals, counseling, and provided education to the patient based on review of the above and I have provided the patient with a written personalized care plan for preventive services.      Vicie Mutters, PA-C   03/25/2018

## 2018-03-31 DIAGNOSIS — M67911 Unspecified disorder of synovium and tendon, right shoulder: Secondary | ICD-10-CM | POA: Diagnosis not present

## 2018-04-04 ENCOUNTER — Other Ambulatory Visit: Payer: Self-pay | Admitting: Internal Medicine

## 2018-04-14 DIAGNOSIS — M25511 Pain in right shoulder: Secondary | ICD-10-CM | POA: Diagnosis not present

## 2018-04-14 DIAGNOSIS — M67911 Unspecified disorder of synovium and tendon, right shoulder: Secondary | ICD-10-CM | POA: Diagnosis not present

## 2018-04-23 DIAGNOSIS — M25511 Pain in right shoulder: Secondary | ICD-10-CM | POA: Diagnosis not present

## 2018-04-28 DIAGNOSIS — M24811 Other specific joint derangements of right shoulder, not elsewhere classified: Secondary | ICD-10-CM | POA: Diagnosis not present

## 2018-04-28 DIAGNOSIS — M75121 Complete rotator cuff tear or rupture of right shoulder, not specified as traumatic: Secondary | ICD-10-CM | POA: Diagnosis not present

## 2018-05-27 DIAGNOSIS — M7541 Impingement syndrome of right shoulder: Secondary | ICD-10-CM | POA: Diagnosis not present

## 2018-05-27 DIAGNOSIS — M7521 Bicipital tendinitis, right shoulder: Secondary | ICD-10-CM | POA: Diagnosis not present

## 2018-05-27 DIAGNOSIS — X58XXXA Exposure to other specified factors, initial encounter: Secondary | ICD-10-CM | POA: Diagnosis not present

## 2018-05-27 DIAGNOSIS — M19011 Primary osteoarthritis, right shoulder: Secondary | ICD-10-CM | POA: Diagnosis not present

## 2018-05-27 DIAGNOSIS — G8918 Other acute postprocedural pain: Secondary | ICD-10-CM | POA: Diagnosis not present

## 2018-05-27 DIAGNOSIS — Y999 Unspecified external cause status: Secondary | ICD-10-CM | POA: Diagnosis not present

## 2018-05-27 DIAGNOSIS — S46011A Strain of muscle(s) and tendon(s) of the rotator cuff of right shoulder, initial encounter: Secondary | ICD-10-CM | POA: Diagnosis not present

## 2018-05-27 HISTORY — PX: ROTATOR CUFF REPAIR: SHX139

## 2018-06-01 ENCOUNTER — Telehealth: Payer: Self-pay

## 2018-06-01 DIAGNOSIS — M25611 Stiffness of right shoulder, not elsewhere classified: Secondary | ICD-10-CM | POA: Diagnosis not present

## 2018-06-01 DIAGNOSIS — Z9889 Other specified postprocedural states: Secondary | ICD-10-CM | POA: Diagnosis not present

## 2018-06-01 DIAGNOSIS — M25511 Pain in right shoulder: Secondary | ICD-10-CM | POA: Diagnosis not present

## 2018-06-01 NOTE — Telephone Encounter (Signed)
Patient has been made aware of provider's suggestion on buying a wrist cuff to check her blood pressure.  Patient also states she was able to get her bld pressure with her thigh cuff.  March 16th 2020 at 12:02pm

## 2018-06-01 NOTE — Telephone Encounter (Signed)
-----   Message from Vicie Mutters, [place] sent at 05/30/2018  9:10 AM EDT ----- Regarding: RE: question Contact: [phone] I would suggest doing a wrist cuff on either side but can do on either side. The risk for doing a cuff on your side that you had a breast cancer is if you had lymph nodes removed it can cause lymph fluid to build up, there is less of a risk with this at the wrist and if you did not have lymph nodes removed.  ----- Message ----- From: Elenor Quinones, CMA Sent: 05/29/2018  11:29 AM EDT To: Vicie Mutters, PA-C Subject: question                                       Patient shoulder surgery Wednesday & can not check her bld pressure with that (RT) arm & left arm past breast cancer side.  Please advise on how to check bld pressure (doesn't have a thigh cuff).

## 2018-06-04 DIAGNOSIS — M25611 Stiffness of right shoulder, not elsewhere classified: Secondary | ICD-10-CM | POA: Diagnosis not present

## 2018-06-04 DIAGNOSIS — M25511 Pain in right shoulder: Secondary | ICD-10-CM | POA: Diagnosis not present

## 2018-06-04 DIAGNOSIS — Z9889 Other specified postprocedural states: Secondary | ICD-10-CM | POA: Diagnosis not present

## 2018-06-09 DIAGNOSIS — M25611 Stiffness of right shoulder, not elsewhere classified: Secondary | ICD-10-CM | POA: Diagnosis not present

## 2018-06-09 DIAGNOSIS — M25511 Pain in right shoulder: Secondary | ICD-10-CM | POA: Diagnosis not present

## 2018-06-09 DIAGNOSIS — Z9889 Other specified postprocedural states: Secondary | ICD-10-CM | POA: Diagnosis not present

## 2018-06-10 ENCOUNTER — Other Ambulatory Visit: Payer: Self-pay | Admitting: Physician Assistant

## 2018-06-11 DIAGNOSIS — Z9889 Other specified postprocedural states: Secondary | ICD-10-CM | POA: Diagnosis not present

## 2018-06-11 DIAGNOSIS — M25511 Pain in right shoulder: Secondary | ICD-10-CM | POA: Diagnosis not present

## 2018-06-11 DIAGNOSIS — M25611 Stiffness of right shoulder, not elsewhere classified: Secondary | ICD-10-CM | POA: Diagnosis not present

## 2018-06-16 DIAGNOSIS — M25611 Stiffness of right shoulder, not elsewhere classified: Secondary | ICD-10-CM | POA: Diagnosis not present

## 2018-06-16 DIAGNOSIS — Z9889 Other specified postprocedural states: Secondary | ICD-10-CM | POA: Diagnosis not present

## 2018-06-16 DIAGNOSIS — M25511 Pain in right shoulder: Secondary | ICD-10-CM | POA: Diagnosis not present

## 2018-06-18 DIAGNOSIS — M25611 Stiffness of right shoulder, not elsewhere classified: Secondary | ICD-10-CM | POA: Diagnosis not present

## 2018-06-18 DIAGNOSIS — Z9889 Other specified postprocedural states: Secondary | ICD-10-CM | POA: Diagnosis not present

## 2018-06-18 DIAGNOSIS — M25511 Pain in right shoulder: Secondary | ICD-10-CM | POA: Diagnosis not present

## 2018-06-24 DIAGNOSIS — M25511 Pain in right shoulder: Secondary | ICD-10-CM | POA: Diagnosis not present

## 2018-06-26 ENCOUNTER — Ambulatory Visit: Payer: Self-pay | Admitting: Internal Medicine

## 2018-06-29 NOTE — Progress Notes (Deleted)
FOLLOW UP  Assessment and Plan:   Hypertension Controlled off of medications Monitor blood pressure at home; patient to call if consistently greater than 130/80 Continue DASH diet.   Reminder to go to the ER if any CP, SOB, nausea, dizziness, severe HA, changes vision/speech, left arm numbness and tingling and jaw pain.  Cholesterol Continue medication *** Continue low cholesterol diet and exercise.  Check lipid panel.   History of Prediabetes Currently well controlled by lifestyle Discussed diet/exercise, weight management  Defer A1C as has been well controlled; CMP  Hypothyroidism continue medications the same reminded to take on an empty stomach 30-85mins before food.  check TSH level  Vitamin D Def At goal at recent check; continue to recommend supplementation for goal of 70-100 Defer vitamin D level   Continue diet and meds as discussed. Further disposition pending results of labs. Discussed med's effects and SE's.   Over 30 minutes of exam, counseling, chart review, and critical decision making was performed.   Future Appointments  Date Time Provider [place]  06/30/2018 10:30 AM Liane Comber, NP [facility] None  12/17/2018 10:00 AM Vicie Mutters, PA-C [facility] None    ----------------------------------------------------------------------------------------------------------------------  HPI 78 y.o. female  presents for 3 month follow up on hypertension, cholesterol, vitamin D deficiency and monitoring due to history of prediabetes currently well controlled by lifestyle.    BMI is There is no height or weight on file to calculate BMI., she has been working on diet and exercise, doing heart healthy diet after her husband had MI with stent this past winter.  Wt Readings from Last 3 Encounters:  03/25/18 127 lb 3.2 oz (57.7 kg)  12/15/17 125 lb (56.7 kg)  09/11/17 121 lb (54.9 kg)   Her blood pressure has been controlled at home, today their BP is     She does not workout. She denies chest pain, shortness of breath, dizziness.   She is on cholesterol medication (atorvastatin 40 mg three times weekly ***)  and denies myalgias. Her cholesterol is not at goal. The cholesterol last visit was:   Lab Results  Component Value Date   CHOL 204 (H) 12/15/2017   HDL 62 12/15/2017   LDLCALC 123 (H) 12/15/2017   TRIG 93 12/15/2017   CHOLHDL 3.3 12/15/2017    She has been working on diet and exercise for personal history of prediabetes, and denies increased appetite, nausea, paresthesia of the feet, polydipsia, polyuria, visual disturbances and vomiting. Last A1C in the office was:  Lab Results  Component Value Date   HGBA1C 5.4 12/15/2017   She is on thyroid medication. Her medication was not changed last visit.   Lab Results  Component Value Date   TSH 0.88 12/15/2017   Patient is on Vitamin D supplement and at goal at last check:    Lab Results  Component Value Date   VD25OH 73 02/26/2017        Current Medications:  Current Outpatient Medications on File Prior to Visit  Medication Sig  . aspirin 81 MG tablet Take 81 mg by mouth daily.  Marland Kitchen atorvastatin (LIPITOR) 40 MG tablet TAKE 1 TABLET BY MOUTH 3 DAYS A WEEK  . azelastine (ASTELIN) 0.1 % nasal spray Place 2 sprays into both nostrils 2 (two) times daily. Use in each nostril as directed  . latanoprost (XALATAN) 0.005 % ophthalmic solution Place 1 drop into both eyes at bedtime.  Marland Kitchen levothyroxine (SYNTHROID, LEVOTHROID) 100 MCG tablet Take 1 tablet every morning on an empty stomach  with only water for 30 minutes & No Antacid Medications for 4 hours  . meclizine (ANTIVERT) 25 MG tablet Take 1 tablet (25 mg total) by mouth 3 (three) times daily as needed for dizziness.  . Multiple Vitamins-Minerals (MULTIVITAMIN PO) Take by mouth daily.  Marland Kitchen OVER THE COUNTER MEDICATION OTC Fiber Gummies  OR   Benefiber.  . predniSONE (DELTASONE) 20 MG tablet 1 tab 3 x day for 3 days, then 1 tab 2 x day  for 3 days, then 1 tab 1 x day for 5 days  . VITAMIN D, CHOLECALCIFEROL, PO Take 2,000 Int'l Units by mouth.    No current facility-administered medications on file prior to visit.      Allergies:  Allergies  Allergen Reactions  . Neomycin-Bacitracin Zn-Polymyx     REACTION: rash  . Propoxyphene Hcl     REACTION: syncope  . Darvon Other (See Comments)    Almost fainted when taking, over 50 years.     Medical History:  Past Medical History:  Diagnosis Date  . Allergy   . Cancer [facility]) 2010   Left Breast  . Dyspnea    chronic-no cardiac reason  . GERD (gastroesophageal reflux disease)   . Glaucoma   . History of thyroid cancer   . Hyperlipidemia   . Hypertension    no meds  . IBS (irritable bowel syndrome)   . Pre-diabetes   . Sciatica    RLE  . Thyroid disease    Hypo  . Vitamin D deficiency   . Wears glasses    Family history- Reviewed and unchanged Social history- Reviewed and unchanged   Review of Systems:  Review of Systems  Constitutional: Negative for chills, fever, malaise/fatigue and weight loss.  HENT: Negative for congestion, hearing loss, sore throat and tinnitus.   Eyes: Negative for blurred vision and double vision.  Respiratory: Negative for cough, shortness of breath and wheezing.   Cardiovascular: Negative for chest pain, palpitations, orthopnea, claudication and leg swelling.  Gastrointestinal: Negative for abdominal pain, blood in stool, constipation, diarrhea, heartburn, melena, nausea and vomiting.  Genitourinary: Negative.   Musculoskeletal: Positive for joint pain (Bilateral knees, needs TKA). Negative for myalgias.  Skin: Negative for rash.  Neurological: Negative for dizziness, tingling, sensory change, weakness and headaches.  Endo/Heme/Allergies: Negative for polydipsia.  Psychiatric/Behavioral: Negative.   All other systems reviewed and are negative.     Physical Exam: There were no vitals taken for this visit. Wt Readings  from Last 3 Encounters:  03/25/18 127 lb 3.2 oz (57.7 kg)  12/15/17 125 lb (56.7 kg)  09/11/17 121 lb (54.9 kg)   General Appearance: Well nourished, in no apparent distress. Eyes: PERRLA, EOMs, conjunctiva no swelling or erythema Sinuses: No Frontal/maxillary tenderness ENT/Mouth: Ext aud canals clear, TMs without erythema, bulging. Post pharynx mildly injected. No swelling, or exudate on post pharynx.  Tonsils not swollen or erythematous. Hearing normal.  Neck: Supple, thyroid normal.  Respiratory: Respiratory effort normal, BS equal bilaterally without rales, rhonchi, wheezing or stridor.  Cardio: RRR with no MRGs. Brisk peripheral pulses without edema.  Abdomen: Soft, + BS.  Non tender, no guarding, rebound, hernias, masses. Lymphatics: Non tender without lymphadenopathy.  Musculoskeletal: Full ROM, 5/5 strength, Normal gait Skin: Warm, dry without rashes, lesions, ecchymosis.  Neuro: Cranial nerves intact. No cerebellar symptoms.  Psych: Awake and oriented X 3, normal affect, Insight and Judgment appropriate.    Izora Ribas, NP 9:25 AM [facility]

## 2018-06-30 ENCOUNTER — Encounter: Payer: Self-pay | Admitting: Adult Health

## 2018-06-30 ENCOUNTER — Other Ambulatory Visit: Payer: Self-pay

## 2018-06-30 ENCOUNTER — Ambulatory Visit: Payer: PPO | Admitting: Adult Health

## 2018-06-30 ENCOUNTER — Ambulatory Visit: Payer: Self-pay | Admitting: Adult Health

## 2018-06-30 VITALS — BP 152/94 | HR 77 | Temp 97.9°F | Wt 122.5 lb

## 2018-06-30 DIAGNOSIS — Z87898 Personal history of other specified conditions: Secondary | ICD-10-CM

## 2018-06-30 DIAGNOSIS — I10 Essential (primary) hypertension: Secondary | ICD-10-CM | POA: Diagnosis not present

## 2018-06-30 DIAGNOSIS — Z79899 Other long term (current) drug therapy: Secondary | ICD-10-CM | POA: Diagnosis not present

## 2018-06-30 DIAGNOSIS — E559 Vitamin D deficiency, unspecified: Secondary | ICD-10-CM

## 2018-06-30 DIAGNOSIS — E782 Mixed hyperlipidemia: Secondary | ICD-10-CM

## 2018-06-30 DIAGNOSIS — M25511 Pain in right shoulder: Secondary | ICD-10-CM | POA: Diagnosis not present

## 2018-06-30 DIAGNOSIS — M25611 Stiffness of right shoulder, not elsewhere classified: Secondary | ICD-10-CM | POA: Diagnosis not present

## 2018-06-30 DIAGNOSIS — E039 Hypothyroidism, unspecified: Secondary | ICD-10-CM | POA: Diagnosis not present

## 2018-06-30 DIAGNOSIS — Z9889 Other specified postprocedural states: Secondary | ICD-10-CM | POA: Diagnosis not present

## 2018-06-30 NOTE — Progress Notes (Signed)
Virtual Visit via Telephone Note  I connected with Haley Shah on 06/30/18 at 10:30 AM EDT by telephone and verified that I am speaking with the correct person using two identifiers.   I discussed the limitations, risks, security and privacy concerns of performing an evaluation and management service by telephone and the availability of in person appointments. I also discussed with the patient that there may be a patient responsible charge related to this service. The patient expressed understanding and agreed to proceed.    I discussed the assessment and treatment plan with the patient. The patient was provided an opportunity to ask questions and all were answered. The patient agreed with the plan and demonstrated an understanding of the instructions.   The patient was advised to call back or seek an in-person evaluation if the symptoms worsen or if the condition fails to improve as anticipated.  I provided 25 minutes of non-face-to-face time during this encounter.   Izora Ribas, NP    FOLLOW UP  Assessment and Plan:   Hypertension Typically well controlled off of medications; ? Current elevations d/t measurement error;  she will have checked at upcoming ortho visit and compare manual to home cuff and she will message back; historically at goal by manual checks in office; defer starting med at this time Monitor blood pressure at home; patient to call if consistently greater than 130/80 Continue DASH diet.   Reminder to go to the ER if any CP, SOB, nausea, dizziness, severe HA, changes vision/speech, left arm numbness and tingling and jaw pain.  Cholesterol Continue medication; atorvastatin 40 mg three times weekly; defer increasing doses in light of not getting labs Continue low cholesterol diet and exercise.  Check lipid panel.   History of Prediabetes Currently well controlled by lifestyle Discussed diet/exercise, weight management  Defer A1C as has been well  controlled; CMP  Hypothyroidism continue medications the same reminded to take on an empty stomach 30-59mins before food.  check TSH level  Vitamin D Def At goal at recent check; continue to recommend supplementation for goal of 70-100 Defer vitamin D level   Continue diet and meds as discussed. Further disposition pending results of labs. Discussed med's effects and SE's.   Over 30 minutes of exam, counseling, chart review, and critical decision making was performed.   Future Appointments  Date Time Provider [place]  12/17/2018 10:00 AM Vicie Mutters, PA-C [facility] None    ----------------------------------------------------------------------------------------------------------------------  HPI 78 y.o. female  presents for 3 month follow up on hypertension, cholesterol, vitamin D deficiency and monitoring due to history of prediabetes currently well controlled by lifestyle.    She had R rotator cuff surgery done by Dr. Berenice Primas in March, is working on PT. She has bilateral knee pain and needs bilateral TKA but has been declining surgery.    BMI is Body mass index is 22.77 kg/m., she has been working on diet and exercise, doing heart healthy diet after her husband had MI with stent this past winter.  Wt Readings from Last 3 Encounters:  06/30/18 122 lb 8 oz (55.6 kg)  03/25/18 127 lb 3.2 oz (57.7 kg)  12/15/17 125 lb (56.7 kg)   She does not typically check BP at home due to historically stable values, never on BP medication, today their BP is BP: (!) 152/94 Historically well controlled by manual readings in office.  She does not workout. She denies chest pain, shortness of breath, dizziness.   She is on cholesterol medication (  atorvastatin 40 mg three times weekly) and denies myalgias. Her cholesterol is not at goal. The cholesterol last visit was:   Lab Results  Component Value Date   CHOL 204 (H) 12/15/2017   HDL 62 12/15/2017   LDLCALC 123 (H) 12/15/2017    TRIG 93 12/15/2017   CHOLHDL 3.3 12/15/2017    She has been working on diet and exercise for personal history of prediabetes, and denies increased appetite, nausea, paresthesia of the feet, polydipsia, polyuria, visual disturbances and vomiting. Last A1C in the office was:  Lab Results  Component Value Date   HGBA1C 5.4 12/15/2017   She is on thyroid medication. Her medication was not changed last visit.   Lab Results  Component Value Date   TSH 0.88 12/15/2017   Patient is on Vitamin D supplement and at goal at last check:    Lab Results  Component Value Date   VD25OH 73 02/26/2017        Current Medications:  Current Outpatient Medications on File Prior to Visit  Medication Sig  . aspirin 81 MG tablet Take 81 mg by mouth daily.  Marland Kitchen atorvastatin (LIPITOR) 40 MG tablet TAKE 1 TABLET BY MOUTH 3 DAYS A WEEK  . latanoprost (XALATAN) 0.005 % ophthalmic solution Place 1 drop into both eyes at bedtime.  Marland Kitchen levothyroxine (SYNTHROID, LEVOTHROID) 100 MCG tablet Take 1 tablet every morning on an empty stomach with only water for 30 minutes & No Antacid Medications for 4 hours (Patient taking differently: Takes one tablet on M,W,F and 1/2 tablet all other days)  . OVER THE COUNTER MEDICATION OTC Fiber Gummies  OR   Benefiber.  Marland Kitchen VITAMIN D, CHOLECALCIFEROL, PO Take 2,000 Int'l Units by mouth.   Marland Kitchen azelastine (ASTELIN) 0.1 % nasal spray Place 2 sprays into both nostrils 2 (two) times daily. Use in each nostril as directed (Patient not taking: Reported on 06/30/2018)  . meclizine (ANTIVERT) 25 MG tablet Take 1 tablet (25 mg total) by mouth 3 (three) times daily as needed for dizziness. (Patient not taking: Reported on 06/30/2018)  . Multiple Vitamins-Minerals (MULTIVITAMIN PO) Take by mouth daily.  . predniSONE (DELTASONE) 20 MG tablet 1 tab 3 x day for 3 days, then 1 tab 2 x day for 3 days, then 1 tab 1 x day for 5 days   No current facility-administered medications on file prior to visit.       Allergies:  Allergies  Allergen Reactions  . Neomycin-Bacitracin Zn-Polymyx     REACTION: rash  . Propoxyphene Hcl     REACTION: syncope  . Darvon Other (See Comments)    Almost fainted when taking, over 50 years.     Medical History:  Past Medical History:  Diagnosis Date  . Allergy   . Cancer [facility]) 2010   Left Breast  . Dyspnea    chronic-no cardiac reason  . GERD (gastroesophageal reflux disease)   . Glaucoma   . History of thyroid cancer   . Hyperlipidemia   . Hypertension    no meds  . IBS (irritable bowel syndrome)   . Pre-diabetes   . Sciatica    RLE  . Thyroid disease    Hypo  . Vitamin D deficiency   . Wears glasses    Family history- Reviewed and unchanged Social history- Reviewed and unchanged   Review of Systems:  Review of Systems  Constitutional: Negative for chills, fever, malaise/fatigue and weight loss.  HENT: Negative for congestion, hearing loss, sore throat and  tinnitus.   Eyes: Negative for blurred vision and double vision.  Respiratory: Negative for cough, shortness of breath and wheezing.   Cardiovascular: Negative for chest pain, palpitations, orthopnea, claudication and leg swelling.  Gastrointestinal: Negative for abdominal pain, blood in stool, constipation, diarrhea, heartburn, melena, nausea and vomiting.  Genitourinary: Negative.   Musculoskeletal: Positive for joint pain (Bilateral knees, needs TKA). Negative for myalgias.  Skin: Negative for rash.  Neurological: Negative for dizziness, tingling, sensory change, weakness and headaches.  Endo/Heme/Allergies: Negative for polydipsia.  Psychiatric/Behavioral: Negative.   All other systems reviewed and are negative.    Physical Exam: BP (!) 152/94   Pulse 77   Temp 97.9 F (36.6 C)   Wt 122 lb 8 oz (55.6 kg)   SpO2 97%   BMI 22.77 kg/m  Wt Readings from Last 3 Encounters:  06/30/18 122 lb 8 oz (55.6 kg)  03/25/18 127 lb 3.2 oz (57.7 kg)  12/15/17 125 lb (56.7 kg)    General : Well sounding patient in no apparent distress HEENT: no hoarseness, no cough for duration of visit Lungs: speaks in complete sentences, no audible wheezing, no apparent distress Neurological: alert, oriented x 3 Psychiatric: pleasant, judgement appropriate    Izora Ribas, NP 10:33 AM [facility]

## 2018-07-02 DIAGNOSIS — M25511 Pain in right shoulder: Secondary | ICD-10-CM | POA: Diagnosis not present

## 2018-07-02 DIAGNOSIS — Z9889 Other specified postprocedural states: Secondary | ICD-10-CM | POA: Diagnosis not present

## 2018-07-02 DIAGNOSIS — M25611 Stiffness of right shoulder, not elsewhere classified: Secondary | ICD-10-CM | POA: Diagnosis not present

## 2018-07-07 DIAGNOSIS — M25511 Pain in right shoulder: Secondary | ICD-10-CM | POA: Diagnosis not present

## 2018-07-07 DIAGNOSIS — M25611 Stiffness of right shoulder, not elsewhere classified: Secondary | ICD-10-CM | POA: Diagnosis not present

## 2018-07-07 DIAGNOSIS — Z9889 Other specified postprocedural states: Secondary | ICD-10-CM | POA: Diagnosis not present

## 2018-07-09 DIAGNOSIS — Z9889 Other specified postprocedural states: Secondary | ICD-10-CM | POA: Diagnosis not present

## 2018-07-09 DIAGNOSIS — M25611 Stiffness of right shoulder, not elsewhere classified: Secondary | ICD-10-CM | POA: Diagnosis not present

## 2018-07-09 DIAGNOSIS — M25511 Pain in right shoulder: Secondary | ICD-10-CM | POA: Diagnosis not present

## 2018-07-14 DIAGNOSIS — Z9889 Other specified postprocedural states: Secondary | ICD-10-CM | POA: Diagnosis not present

## 2018-07-14 DIAGNOSIS — M25611 Stiffness of right shoulder, not elsewhere classified: Secondary | ICD-10-CM | POA: Diagnosis not present

## 2018-07-14 DIAGNOSIS — M25511 Pain in right shoulder: Secondary | ICD-10-CM | POA: Diagnosis not present

## 2018-07-16 DIAGNOSIS — M25611 Stiffness of right shoulder, not elsewhere classified: Secondary | ICD-10-CM | POA: Diagnosis not present

## 2018-07-16 DIAGNOSIS — M25511 Pain in right shoulder: Secondary | ICD-10-CM | POA: Diagnosis not present

## 2018-07-16 DIAGNOSIS — Z9889 Other specified postprocedural states: Secondary | ICD-10-CM | POA: Diagnosis not present

## 2018-07-21 DIAGNOSIS — M25611 Stiffness of right shoulder, not elsewhere classified: Secondary | ICD-10-CM | POA: Diagnosis not present

## 2018-07-21 DIAGNOSIS — M25511 Pain in right shoulder: Secondary | ICD-10-CM | POA: Diagnosis not present

## 2018-07-21 DIAGNOSIS — Z9889 Other specified postprocedural states: Secondary | ICD-10-CM | POA: Diagnosis not present

## 2018-07-23 DIAGNOSIS — M25511 Pain in right shoulder: Secondary | ICD-10-CM | POA: Diagnosis not present

## 2018-07-23 DIAGNOSIS — Z9889 Other specified postprocedural states: Secondary | ICD-10-CM | POA: Diagnosis not present

## 2018-07-23 DIAGNOSIS — M25611 Stiffness of right shoulder, not elsewhere classified: Secondary | ICD-10-CM | POA: Diagnosis not present

## 2018-07-28 DIAGNOSIS — Z9889 Other specified postprocedural states: Secondary | ICD-10-CM | POA: Diagnosis not present

## 2018-07-28 DIAGNOSIS — M25511 Pain in right shoulder: Secondary | ICD-10-CM | POA: Diagnosis not present

## 2018-07-28 DIAGNOSIS — M25611 Stiffness of right shoulder, not elsewhere classified: Secondary | ICD-10-CM | POA: Diagnosis not present

## 2018-07-30 DIAGNOSIS — Z9889 Other specified postprocedural states: Secondary | ICD-10-CM | POA: Diagnosis not present

## 2018-07-30 DIAGNOSIS — M25511 Pain in right shoulder: Secondary | ICD-10-CM | POA: Diagnosis not present

## 2018-07-30 DIAGNOSIS — M25611 Stiffness of right shoulder, not elsewhere classified: Secondary | ICD-10-CM | POA: Diagnosis not present

## 2018-08-04 DIAGNOSIS — M25511 Pain in right shoulder: Secondary | ICD-10-CM | POA: Diagnosis not present

## 2018-08-04 DIAGNOSIS — Z9889 Other specified postprocedural states: Secondary | ICD-10-CM | POA: Diagnosis not present

## 2018-08-04 DIAGNOSIS — M25611 Stiffness of right shoulder, not elsewhere classified: Secondary | ICD-10-CM | POA: Diagnosis not present

## 2018-08-06 DIAGNOSIS — M25511 Pain in right shoulder: Secondary | ICD-10-CM | POA: Diagnosis not present

## 2018-08-06 DIAGNOSIS — M25611 Stiffness of right shoulder, not elsewhere classified: Secondary | ICD-10-CM | POA: Diagnosis not present

## 2018-08-06 DIAGNOSIS — Z9889 Other specified postprocedural states: Secondary | ICD-10-CM | POA: Diagnosis not present

## 2018-08-11 DIAGNOSIS — Z9889 Other specified postprocedural states: Secondary | ICD-10-CM | POA: Diagnosis not present

## 2018-08-11 DIAGNOSIS — M25611 Stiffness of right shoulder, not elsewhere classified: Secondary | ICD-10-CM | POA: Diagnosis not present

## 2018-08-11 DIAGNOSIS — M25511 Pain in right shoulder: Secondary | ICD-10-CM | POA: Diagnosis not present

## 2018-08-13 DIAGNOSIS — Z9889 Other specified postprocedural states: Secondary | ICD-10-CM | POA: Diagnosis not present

## 2018-08-13 DIAGNOSIS — M25511 Pain in right shoulder: Secondary | ICD-10-CM | POA: Diagnosis not present

## 2018-08-13 DIAGNOSIS — M25611 Stiffness of right shoulder, not elsewhere classified: Secondary | ICD-10-CM | POA: Diagnosis not present

## 2018-08-18 DIAGNOSIS — M25611 Stiffness of right shoulder, not elsewhere classified: Secondary | ICD-10-CM | POA: Diagnosis not present

## 2018-08-18 DIAGNOSIS — Z9889 Other specified postprocedural states: Secondary | ICD-10-CM | POA: Diagnosis not present

## 2018-08-18 DIAGNOSIS — M25511 Pain in right shoulder: Secondary | ICD-10-CM | POA: Diagnosis not present

## 2018-08-26 DIAGNOSIS — M25611 Stiffness of right shoulder, not elsewhere classified: Secondary | ICD-10-CM | POA: Diagnosis not present

## 2018-08-26 DIAGNOSIS — M25511 Pain in right shoulder: Secondary | ICD-10-CM | POA: Diagnosis not present

## 2018-08-26 DIAGNOSIS — Z9889 Other specified postprocedural states: Secondary | ICD-10-CM | POA: Diagnosis not present

## 2018-09-02 DIAGNOSIS — M25611 Stiffness of right shoulder, not elsewhere classified: Secondary | ICD-10-CM | POA: Diagnosis not present

## 2018-09-02 DIAGNOSIS — Z9889 Other specified postprocedural states: Secondary | ICD-10-CM | POA: Diagnosis not present

## 2018-09-02 DIAGNOSIS — M25511 Pain in right shoulder: Secondary | ICD-10-CM | POA: Diagnosis not present

## 2018-09-10 DIAGNOSIS — M25611 Stiffness of right shoulder, not elsewhere classified: Secondary | ICD-10-CM | POA: Diagnosis not present

## 2018-09-10 DIAGNOSIS — M25511 Pain in right shoulder: Secondary | ICD-10-CM | POA: Diagnosis not present

## 2018-09-10 DIAGNOSIS — Z9889 Other specified postprocedural states: Secondary | ICD-10-CM | POA: Diagnosis not present

## 2018-09-16 DIAGNOSIS — M25611 Stiffness of right shoulder, not elsewhere classified: Secondary | ICD-10-CM | POA: Diagnosis not present

## 2018-09-16 DIAGNOSIS — M25511 Pain in right shoulder: Secondary | ICD-10-CM | POA: Diagnosis not present

## 2018-09-16 DIAGNOSIS — Z9889 Other specified postprocedural states: Secondary | ICD-10-CM | POA: Diagnosis not present

## 2018-09-25 DIAGNOSIS — M67911 Unspecified disorder of synovium and tendon, right shoulder: Secondary | ICD-10-CM | POA: Diagnosis not present

## 2018-09-30 DIAGNOSIS — M25611 Stiffness of right shoulder, not elsewhere classified: Secondary | ICD-10-CM | POA: Diagnosis not present

## 2018-09-30 DIAGNOSIS — M25511 Pain in right shoulder: Secondary | ICD-10-CM | POA: Diagnosis not present

## 2018-09-30 DIAGNOSIS — Z9889 Other specified postprocedural states: Secondary | ICD-10-CM | POA: Diagnosis not present

## 2018-10-14 DIAGNOSIS — M25511 Pain in right shoulder: Secondary | ICD-10-CM | POA: Diagnosis not present

## 2018-10-14 DIAGNOSIS — Z9889 Other specified postprocedural states: Secondary | ICD-10-CM | POA: Diagnosis not present

## 2018-10-14 DIAGNOSIS — M25611 Stiffness of right shoulder, not elsewhere classified: Secondary | ICD-10-CM | POA: Diagnosis not present

## 2018-11-02 DIAGNOSIS — M25511 Pain in right shoulder: Secondary | ICD-10-CM | POA: Diagnosis not present

## 2018-11-25 DIAGNOSIS — H02834 Dermatochalasis of left upper eyelid: Secondary | ICD-10-CM | POA: Diagnosis not present

## 2018-11-25 DIAGNOSIS — H02831 Dermatochalasis of right upper eyelid: Secondary | ICD-10-CM | POA: Diagnosis not present

## 2018-11-25 DIAGNOSIS — H401131 Primary open-angle glaucoma, bilateral, mild stage: Secondary | ICD-10-CM | POA: Diagnosis not present

## 2018-11-25 DIAGNOSIS — Z961 Presence of intraocular lens: Secondary | ICD-10-CM | POA: Diagnosis not present

## 2018-11-25 DIAGNOSIS — H04123 Dry eye syndrome of bilateral lacrimal glands: Secondary | ICD-10-CM | POA: Diagnosis not present

## 2018-12-01 ENCOUNTER — Ambulatory Visit (INDEPENDENT_AMBULATORY_CARE_PROVIDER_SITE_OTHER): Payer: PPO | Admitting: *Deleted

## 2018-12-01 ENCOUNTER — Other Ambulatory Visit: Payer: Self-pay

## 2018-12-01 VITALS — BP 130/76 | HR 76 | Temp 97.2°F | Resp 16 | Ht 61.5 in | Wt 121.0 lb

## 2018-12-01 DIAGNOSIS — S71152A Open bite, left thigh, initial encounter: Secondary | ICD-10-CM

## 2018-12-01 DIAGNOSIS — Z23 Encounter for immunization: Secondary | ICD-10-CM | POA: Diagnosis not present

## 2018-12-01 NOTE — Progress Notes (Signed)
Patient is here for a NV, due to being bit by a dog on her left upper thigh on 11/30/2018. The area was bruised and the animal did break the skin, but no sign of infection. Per the patient, the owners of the dog state he has had his rabies injection.  A T-dap injection was given in her right deltoid, per Dr Melford Aase.

## 2018-12-15 NOTE — Progress Notes (Signed)
COMPLETE PHYSICAL  Assessment:     HYPOTHYROIDISM Hypothyroidism-check TSH level, continue medications the same, reminded to take on an empty stomach 30-23mins before food.  - TSH  Abnormal glucose at goal  OSTEOPENIA Due for DEXA , cont vitamin D   HYPERLIPIDEMIA  -continue medications, check lipids, decrease fatty foods, increase activity.  - CBC with Differential - Hepatic function panel - BASIC METABOLIC PANEL WITH GFR - Lipid panel   Allergy  Vitamin D deficiency - Vit D  25 hydroxy (rtn osteoporosis monitoring)  Encounter for long-term (current) use of other medications - Magnesium  History of breast cancer Continue MGM  Essential hypertension -     CBC with Differential/Platelet -     BASIC METABOLIC PANEL WITH GFR -     Hepatic function panel -     TSH  Thyroid Cancer,Hx Continue thyroid replacement  Gastroesophageal reflux disease, esophagitis presence not specified Continue PPI/H2 blocker, diet discussed  Other irritable bowel syndrome If not on benefiber then add it, decrease stress,  if any worsening symptoms, blood in stool, AB pain, etc call office  Future Appointments  Date Time Provider [place]  03/25/2019 10:45 AM Garnet Sierras, NP [facility] None  12/20/2019  9:00 AM Vicie Mutters, PA-C [facility] None     Subjective:   Haley Shah is a 78 y.o. female who presents for CPE and follow up.    Doing okay after shoulder in March for rotator cuff, [name] is having his shoulder surgery next week.  She got a Tdap 12/01/2018 due to a dog bite.   Her blood pressure has been controlled at home, today their BP is BP: 128/78 She does workout. She denies chest pain, shortness of breath, dizziness.  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  Lab Results  Component Value Date   GFRNONAA 60 12/15/2017   She is on cholesterol medication, lipitor 40 3 days a week RX reflects this. .Her cholesterol is not at goal. The cholesterol last visit was:   Lab  Results  Component Value Date   CHOL 204 (H) 12/15/2017   HDL 62 12/15/2017   LDLCALC 123 (H) 12/15/2017   TRIG 93 12/15/2017   CHOLHDL 3.3 12/15/2017     Last A1C in the office was:  Lab Results  Component Value Date   HGBA1C 5.4 12/15/2017  Has history of left breast cancer in 2009, gets regular visit.  Patient is on Vitamin D supplement. Lab Results  Component Value Date   VD25OH 73 02/26/2017   She has benign essential tremor.  She is on thyroid medication. Her medication was changed last visit, she is on 1/2 pill rest of the week and 1 pill M,W,F.  Patient denies nervousness and palpitations.  Lab Results  Component Value Date   TSH 0.88 12/15/2017   BMI is Body mass index is 22.68 kg/m., she is working on diet and exercise. Wt Readings from Last 3 Encounters:  12/17/18 122 lb (55.3 kg)  12/01/18 121 lb (54.9 kg)  06/30/18 122 lb 8 oz (55.6 kg)    Names of Other Physician/Practitioners you currently use: 1. [facility]- here for primary care 2. Dr. Katy Fitch, eye doctor, 09/2018 3.  Dr. Harmon Pier , dentist, last visit 6 months ago Patient Care Team: Unk Pinto, MD as PCP - General (Internal Medicine)  Medication Review  Current Outpatient Medications (Endocrine & Metabolic):  .  levothyroxine (SYNTHROID, LEVOTHROID) 100 MCG tablet, Take 1 tablet every morning on an empty  stomach with only water for 30 minutes & No Antacid Medications for 4 hours (Patient taking differently: Takes one tablet on M,W,F and 1/2 tablet all other days)  Current Outpatient Medications (Cardiovascular):  .  atorvastatin (LIPITOR) 40 MG tablet, TAKE 1 TABLET BY MOUTH 3 DAYS A WEEK  Current Outpatient Medications (Respiratory):  .  azelastine (ASTELIN) 0.1 % nasal spray, Place 2 sprays into both nostrils 2 (two) times daily. Use in each nostril as directed  Current Outpatient Medications (Analgesics):  .  aspirin 81 MG tablet, Take 81 mg by mouth  daily.   Current Outpatient Medications (Other):  .  latanoprost (XALATAN) 0.005 % ophthalmic solution, Place 1 drop into both eyes at bedtime. .  Magnesium 250 MG TABS, Take 1 tablet by mouth daily as needed. Takes as needed for muscle cramps .  meclizine (ANTIVERT) 25 MG tablet, Take 1 tablet (25 mg total) by mouth 3 (three) times daily as needed for dizziness. .  Multiple Vitamins-Minerals (MULTIVITAMIN PO), Take by mouth daily. Marland Kitchen  OVER THE COUNTER MEDICATION, OTC Fiber Gummies  OR   Benefiber. Marland Kitchen  VITAMIN D, CHOLECALCIFEROL, PO, Take 2,000 Int'l Units by mouth.   Current Problems (verified) Patient Active Problem List   Diagnosis Date Noted  . COPD (chronic obstructive pulmonary disease) ([place]) 12/14/2017  . Essential hypertension 10/27/2013  . Medication management 04/23/2013  . History of prediabetes   . Vitamin D deficiency   . History of left breast cancer 11/03/2012  . Hypothyroidism 03/22/2008  . Hyperlipidemia 03/22/2008  . G E R D 03/22/2008  . IBS 03/22/2008  . Vertigo 03/22/2008  . Thyroid Cancer,Hx 03/22/2008    Screening Tests Immunization History  Administered Date(s) Administered  . Influenza, High Dose Seasonal PF 01/05/2013, 01/05/2015, 11/27/2015, 12/20/2016, 12/15/2017, 12/17/2018  . Pneumococcal Conjugate-13 03/02/2015  . Pneumococcal Polysaccharide-23 03/18/2008  . Td 03/18/2005  . Tdap 12/01/2018   Preventative care: Last colonoscopy: 2011, not interested in colonoscopy Last mammogram: 01/2018, CAT B scheduled Last pap smear/pelvic exam: 2012 declines another DEXA: 12/2016, ostoepenia due this year Echo 2010  Prior vaccinations: TD or Tdap: 2020 had due to dog bite Influenza: 2020 TODAY Pneumococcal: 2010 Prevnar 13: 2016 Shingles/Zostavax: declines due to cost  Allergies Allergies  Allergen Reactions  . Neomycin-Bacitracin Zn-Polymyx     REACTION: rash  . Propoxyphene Hcl     REACTION: syncope  . Darvon Other (See Comments)    Almost  fainted when taking, over 50 years.    SURGICAL HISTORY She  has a past surgical history that includes Thyroid surgery (1986); Breast lumpectomy (2010); Colonoscopy; Cardiac catheterization (2006); Knee arthroscopy with medial menisectomy (Left, 05/25/2014); Knee arthroscopy with lateral menisectomy (Left, 05/25/2014); Chondroplasty (Left, 05/25/2014); and Rotator cuff repair (Right, 05/27/2018). FAMILY HISTORY Her family history includes Cancer in her father; Cancer - Ovarian in her sister; Colon cancer in her father; Stroke in her father. SOCIAL HISTORY She  reports that she has never smoked. She has never used smokeless tobacco. She reports that she does not drink alcohol or use drugs.   Review of Systems  Constitutional: Negative.   HENT: Negative for congestion, ear discharge, ear pain, hearing loss, nosebleeds, sinus pain, sore throat and tinnitus.   Eyes: Negative.   Respiratory: Negative.  Negative for cough, shortness of breath and stridor.   Cardiovascular: Negative.   Gastrointestinal: Negative.   Genitourinary: Negative.   Musculoskeletal: Negative.   Skin: Negative.      Objective:   Blood pressure 128/78, pulse 76,  temperature 97.6 F (36.4 C), height 5' 1.5" (1.562 m), weight 122 lb (55.3 kg), SpO2 96 %. Body mass index is 22.68 kg/m.  General appearance: alert, no distress, WD/WN,  female HEENT: normocephalic, sclerae anicteric, TMs pearly, nares patent, no discharge or erythema, pharynx normal Oral cavity: MMM, no lesions Neck: supple, no lymphadenopathy, no thyromegaly, no masses Heart: RRR, normal S1, S2, no murmurs Lungs: CTA bilaterally, no wheezes, rhonchi, or rales Abdomen: +bs, soft, non tender, non distended, no masses, no hepatomegaly, no splenomegaly Musculoskeletal: nontender, no swelling, no obvious deformity. Patient is able to ambulate well.  Extremities: no edema, no cyanosis, no clubbing Pulses: 2+ symmetric, upper and lower extremities, normal cap  refill Neurological: alert, oriented x 3, CN2-12 intact, strength normal upper extremities and lower extremities, sensation normal throughout, DTRs 2+ throughout, no cerebellar signs, gait normal Psychiatric: normal affect, behavior normal, pleasant    Vicie Mutters, PA-C   12/17/2018

## 2018-12-17 ENCOUNTER — Encounter: Payer: Self-pay | Admitting: Physician Assistant

## 2018-12-17 ENCOUNTER — Ambulatory Visit (INDEPENDENT_AMBULATORY_CARE_PROVIDER_SITE_OTHER): Payer: PPO | Admitting: Physician Assistant

## 2018-12-17 ENCOUNTER — Other Ambulatory Visit: Payer: Self-pay

## 2018-12-17 VITALS — BP 128/78 | HR 76 | Temp 97.6°F | Ht 61.5 in | Wt 122.0 lb

## 2018-12-17 DIAGNOSIS — E039 Hypothyroidism, unspecified: Secondary | ICD-10-CM | POA: Diagnosis not present

## 2018-12-17 DIAGNOSIS — Z23 Encounter for immunization: Secondary | ICD-10-CM

## 2018-12-17 DIAGNOSIS — Z136 Encounter for screening for cardiovascular disorders: Secondary | ICD-10-CM | POA: Diagnosis not present

## 2018-12-17 DIAGNOSIS — Z0000 Encounter for general adult medical examination without abnormal findings: Secondary | ICD-10-CM | POA: Diagnosis not present

## 2018-12-17 DIAGNOSIS — K588 Other irritable bowel syndrome: Secondary | ICD-10-CM

## 2018-12-17 DIAGNOSIS — Z0001 Encounter for general adult medical examination with abnormal findings: Secondary | ICD-10-CM

## 2018-12-17 DIAGNOSIS — I10 Essential (primary) hypertension: Secondary | ICD-10-CM

## 2018-12-17 DIAGNOSIS — E782 Mixed hyperlipidemia: Secondary | ICD-10-CM

## 2018-12-17 DIAGNOSIS — E559 Vitamin D deficiency, unspecified: Secondary | ICD-10-CM

## 2018-12-17 DIAGNOSIS — R7309 Other abnormal glucose: Secondary | ICD-10-CM

## 2018-12-17 DIAGNOSIS — Z853 Personal history of malignant neoplasm of breast: Secondary | ICD-10-CM

## 2018-12-17 DIAGNOSIS — J449 Chronic obstructive pulmonary disease, unspecified: Secondary | ICD-10-CM

## 2018-12-17 DIAGNOSIS — Z79899 Other long term (current) drug therapy: Secondary | ICD-10-CM

## 2018-12-17 DIAGNOSIS — K219 Gastro-esophageal reflux disease without esophagitis: Secondary | ICD-10-CM

## 2018-12-17 DIAGNOSIS — Z85850 Personal history of malignant neoplasm of thyroid: Secondary | ICD-10-CM

## 2018-12-17 DIAGNOSIS — M8580 Other specified disorders of bone density and structure, unspecified site: Secondary | ICD-10-CM

## 2018-12-17 DIAGNOSIS — R42 Dizziness and giddiness: Secondary | ICD-10-CM

## 2018-12-17 NOTE — Patient Instructions (Signed)
Tarsal Tunnel Syndrome  Tarsal tunnel syndrome is a condition that happens when a nerve called the tibial nerve is irritated or squeezed (compressed) as it passes through an area on the inside of your ankle (tarsal tunnel). The tarsal tunnel is a narrow passage through the connective tissue and bones in your feet (tarsals). The tibial nerve passes behind the large bony bump at your inner ankle (medial malleolus) and sends branches to your foot and toes. This nerve enables feeling by passing signals to your heel, the bottom of your foot, and some of your toe muscles. Tarsal tunnel syndrome usually causes ankle and foot pain that gets worse with activity. What are the causes? Tarsal tunnel syndrome can be caused by any condition that narrows the space in the tarsal tunnel. Athletes may get tarsal tunnel syndrome from a fractured ankle or from an outward (eversion) ankle sprain that results in scarring or swelling. Other common causes include:  Overpronation. This is when your feet roll inward and flatten too much when you stand, walk, or run.  Extra pressure on the tarsal tunnel area from tight or stiff shoes or boots.  Decreased room in the tarsal tunnel due to small, fluid-filled sacs (cysts) or growths on the bones near the tunnel (exostosis). What increases the risk? This condition is more likely to develop in people who:  Play sports where they wear high, stiff boots, such as downhill skiing.  Play sports with repetitive motion, such as running.  Play sports on uneven surfaces that can lead to a sprained ankle, such as soccer or football.  Have had an inner ankle injury.  Have flat feet.  Have other conditions, such as diabetes, hypothyroidism, or rheumatoid arthritis. What are the signs or symptoms? Symptoms of this condition include:  Numbness or a prickling and tingling sensation in your heel, foot, or toes.  Swelling in your ankle or heel. At first, your symptoms may get worse  with activity and be relieved with rest. Over time, your symptoms may become constant or come on sooner with less activity. How is this diagnosed? This condition is diagnosed based on:  Your symptoms.  Your medical history.  A physical exam. Your health care provider may tap on the area below your ankle to check for tingling in your foot or toes.  You may also have other tests, including: ? X-rays to check bone structure. ? An MRI or ultrasound to examine nerve and tendon structures and find where your nerve is getting compressed. ? A study of nerve function (electromyography or EMG). How is this treated? Treatment may include:  Wearing a removable splint or boot for ankle support.  Using a shoe insert (orthotic) to help support your arch.  Taking NSAIDs to reduce pain.  Using ice to reduce swelling.  Having medicine injected into your ankle joint to reduce pain and swelling.  Starting range-of-motion exercises and strengthening exercises.  Gradually returning to full activity. The timing will depend on the severity of your condition and your response to treatment.  Surgery. Follow these instructions at home: If you have a splint or boot:  Wear the splint or boot as told by your health care provider. Remove it only as told by your health care provider.  Loosen the splint or boot if your toes tingle, become numb, or turn cold and blue.  Keep the splint or boot clean.  If your splint or boot is not waterproof: ? Do not let it get wet. ? Cover it with  a watertight covering when you take a bath or shower.  Ask your health care provider when it is safe to drive if your splint or boot is on a foot that you use for driving. Managing pain, stiffness, and swelling   If directed, put ice on the injured area. ? If you have a removable splint or boot, remove it as told by your health care provider. ? Put ice in a plastic bag. ? Place a towel between your skin and the bag. ?  Leave the ice on for 20 minutes, 2-3 times a day.  Move your toes often to reduce stiffness and swelling.  Raise (elevate) your foot above the level of your heart while you are sitting or lying down. Activity  Return to your normal activities as told by your health care provider. Ask your health care provider what activities are safe for you.  Do exercises as told by your health care provider. General instructions  Take over-the-counter and prescription medicines only as told by your health care provider.  Do not use any products that contain nicotine or tobacco, such as cigarettes, e-cigarettes, and chewing tobacco. These can delay healing. If you need help quitting, ask your health care provider.  Keep all follow-up visits as told by your health care provider. This is important. How is this prevented?  Give your body time to rest between periods of activity.  Make sure to wear supportive and comfortable shoes during athletic activity.  Do not overtighten ski boots or the laces on high-top shoes.  Be safe and responsible while being active to avoid falls. Contact a health care provider if:  Your ankle pain is not getting better.  You are unable to support (bear) body weight on your foot without pain. Summary  Tarsal tunnel syndrome is a condition that happens when a nerve called the tibial nerve is irritated or squeezed (compressed) as it passes through an area on the inside of your ankle (tarsal tunnel).  Tarsal tunnel syndrome usually causes ankle and foot pain that gets worse with activity.  This condition may be treated with a splint or boot, shoe inserts, ice, exercises, medicines, and surgery if needed.  Follow your health care provider's instructions for caring for your splint or boot.  Contact your health care provider if your ankle pain is not getting better, or if you are unable to bear your body weight without pain. This information is not intended to replace  advice given to you by your health care provider. Make sure you discuss any questions you have with your health care provider. Document Released: 03/04/2005 Document Revised: 01/22/2018 Document Reviewed: 01/22/2018 Elsevier Patient Education  2020 [employer].

## 2018-12-18 LAB — COMPLETE METABOLIC PANEL WITH GFR
AG Ratio: 2.3 (calc) (ref 1.0–2.5)
ALT: 8 U/L (ref 6–29)
AST: 14 U/L (ref 10–35)
Albumin: 4.8 g/dL (ref 3.6–5.1)
Alkaline phosphatase (APISO): 78 U/L (ref 37–153)
BUN: 13 mg/dL (ref 7–25)
CO2: 29 mmol/L (ref 20–32)
Calcium: 9.9 mg/dL (ref 8.6–10.4)
Chloride: 103 mmol/L (ref 98–110)
Creat: 0.83 mg/dL (ref 0.60–0.93)
GFR, Est African American: 78 mL/min/{1.73_m2} (ref >=60)
GFR, Est Non African American: 67 mL/min/{1.73_m2} (ref >=60)
Globulin: 2.1 g/dL (calc) (ref 1.9–3.7)
Glucose, Bld: 91 mg/dL (ref 65–99)
Potassium: 4.4 mmol/L (ref 3.5–5.3)
Sodium: 141 mmol/L (ref 135–146)
Total Bilirubin: 0.6 mg/dL (ref 0.2–1.2)
Total Protein: 6.9 g/dL (ref 6.1–8.1)

## 2018-12-18 LAB — CBC WITH DIFFERENTIAL/PLATELET
Absolute Monocytes: 774 cells/uL (ref 200–950)
Basophils Absolute: 79 cells/uL (ref 0–200)
Basophils Relative: 1 %
Eosinophils Absolute: 87 cells/uL (ref 15–500)
Eosinophils Relative: 1.1 %
HCT: 43.1 % (ref 35.0–45.0)
Hemoglobin: 14.2 g/dL (ref 11.7–15.5)
Lymphs Abs: 1683 cells/uL (ref 850–3900)
MCH: 30 pg (ref 27.0–33.0)
MCHC: 32.9 g/dL (ref 32.0–36.0)
MCV: 90.9 fL (ref 80.0–100.0)
MPV: 10 fL (ref 7.5–12.5)
Monocytes Relative: 9.8 %
Neutro Abs: 5277 cells/uL (ref 1500–7800)
Neutrophils Relative %: 66.8 %
Platelets: 268 10*3/uL (ref 140–400)
RBC: 4.74 10*6/uL (ref 3.80–5.10)
RDW: 13 % (ref 11.0–15.0)
Total Lymphocyte: 21.3 %
WBC: 7.9 10*3/uL (ref 3.8–10.8)

## 2018-12-18 LAB — MICROALBUMIN / CREATININE URINE RATIO
Creatinine, Urine: 16 mg/dL — ABNORMAL LOW (ref 20–275)
Microalb, Ur: <0.2 mg/dL

## 2018-12-18 LAB — TSH: TSH: 1.76 mIU/L (ref 0.40–4.50)

## 2018-12-18 LAB — HEMOGLOBIN A1C
Hgb A1c MFr Bld: 5.2 % of total Hgb (ref <5.7)
Mean Plasma Glucose: 103 (calc)
eAG (mmol/L): 5.7 (calc)

## 2018-12-18 LAB — MAGNESIUM: Magnesium: 2.3 mg/dL (ref 1.5–2.5)

## 2018-12-18 LAB — URINALYSIS, ROUTINE W REFLEX MICROSCOPIC
Bacteria, UA: NONE SEEN /HPF
Bilirubin Urine: NEGATIVE
Glucose, UA: NEGATIVE
Hyaline Cast: NONE SEEN /LPF
Ketones, ur: NEGATIVE
Leukocytes,Ua: NEGATIVE
Nitrite: NEGATIVE
Protein, ur: NEGATIVE
Specific Gravity, Urine: 1.004 (ref 1.001–1.03)
Squamous Epithelial / HPF: NONE SEEN /HPF (ref <=5)
WBC, UA: NONE SEEN /HPF (ref 0–5)
pH: 6.5 (ref 5.0–8.0)

## 2018-12-18 LAB — VITAMIN D 25 HYDROXY (VIT D DEFICIENCY, FRACTURES): Vit D, 25-Hydroxy: 65 ng/mL (ref 30–100)

## 2018-12-18 LAB — LIPID PANEL
Cholesterol: 188 mg/dL (ref <200)
HDL: 59 mg/dL (ref >=50)
LDL Cholesterol (Calc): 108 mg/dL (calc) — ABNORMAL HIGH
Non-HDL Cholesterol (Calc): 129 mg/dL (calc) (ref <130)
Total CHOL/HDL Ratio: 3.2 (calc) (ref <5.0)
Triglycerides: 118 mg/dL (ref <150)

## 2019-01-13 DIAGNOSIS — M8580 Other specified disorders of bone density and structure, unspecified site: Secondary | ICD-10-CM

## 2019-01-26 DIAGNOSIS — Z85828 Personal history of other malignant neoplasm of skin: Secondary | ICD-10-CM | POA: Diagnosis not present

## 2019-01-26 DIAGNOSIS — L570 Actinic keratosis: Secondary | ICD-10-CM | POA: Diagnosis not present

## 2019-01-26 DIAGNOSIS — L821 Other seborrheic keratosis: Secondary | ICD-10-CM | POA: Diagnosis not present

## 2019-01-26 DIAGNOSIS — D485 Neoplasm of uncertain behavior of skin: Secondary | ICD-10-CM | POA: Diagnosis not present

## 2019-01-27 DIAGNOSIS — R29890 Loss of height: Secondary | ICD-10-CM | POA: Diagnosis not present

## 2019-01-27 DIAGNOSIS — Z1231 Encounter for screening mammogram for malignant neoplasm of breast: Secondary | ICD-10-CM | POA: Diagnosis not present

## 2019-01-27 DIAGNOSIS — M8589 Other specified disorders of bone density and structure, multiple sites: Secondary | ICD-10-CM | POA: Diagnosis not present

## 2019-01-27 DIAGNOSIS — Z853 Personal history of malignant neoplasm of breast: Secondary | ICD-10-CM | POA: Diagnosis not present

## 2019-01-27 LAB — HM MAMMOGRAPHY

## 2019-01-27 LAB — HM DEXA SCAN

## 2019-01-28 ENCOUNTER — Encounter: Payer: Self-pay | Admitting: *Deleted

## 2019-02-01 ENCOUNTER — Encounter: Payer: Self-pay | Admitting: *Deleted

## 2019-03-25 ENCOUNTER — Ambulatory Visit: Payer: PPO | Admitting: Adult Health Nurse Practitioner

## 2019-04-01 ENCOUNTER — Ambulatory Visit: Payer: PPO | Admitting: Physician Assistant

## 2019-04-07 NOTE — Progress Notes (Signed)
MEDICARE WELLNESS AND 3 MONTH OV  Assessment:    Encounter for annual medicare wellness visit Due 1 year; declines colonoscopy   History of prediabetes Recent A1Cs at goal Discussed diet/exercise, weight management  Defer A1C; check CMP  OSTEOPENIA Due for DEXA 2022, cont vitamin D, calcium, weight bearing exerces   HYPERLIPIDEMIA  -continue medications, LDL goal <100, check lipids, decrease fatty foods, increase activity.  - CBC with Differential - CMP/GFR - Lipid panel   Allergy  Vitamin D deficiency - Vit D  25 hydroxy (rtn osteoporosis monitoring)  Encounter for long-term (current) use of other medications - Magnesium  History of breast cancer Continue MGM  Essential hypertension Recently fairly controlled without medicatoins Monitor blood pressure at home; call if consistently over 130/80 Continue DASH diet.   Reminder to go to the ER if any CP, SOB, nausea, dizziness, severe HA, changes vision/speech, left arm numbness and tingling and jaw pain. -     CBC with Differential/Platelet -     CMP/GFR -     TSH  Thyroid Cancer,Hx S/p resection remotely;  Continue thyroid replacement   HYPOTHYROIDISM Hypothyroidism-check TSH level, continue medications the same, reminded to take on an empty stomach 30-57mins before food.  - TSH  Gastroesophageal reflux disease, esophagitis presence not specified Fairly managed off of medications recently  Discussed diet, avoiding triggers and other lifestyle changes  Other irritable bowel syndrome If not on benefiber then add it, decrease stress,  if any worsening symptoms, blood in stool, AB pain, etc call office  COPD By imaging, no smoking/exposure history, age related Denies sx; monitor  Bil knee arthritis Has seen ortho for injections; declined surgery; recommended tylenol PRN  Future Appointments  Date Time Provider [place]  04/08/2019  4:00 PM Liane Comber, NP [facility] None  05/11/2019  6:00 PM  [place] [facility]  12/20/2019  9:00 AM Vicie Mutters, PA-C [facility] None     Subjective:   Haley Shah is a 79 y.o. female who presents for wellness and 3 month follow up for htn, hyperlipidemia, glucose management, vitamin D deficiency.   Has history of left breast cancer in 2009, continues with annual mammograms.   She has COPD/hyperinflation by imaging on CXR since 2010; denies history of smoking, denies dyspnea, cough, secretions.   She has bil knee arthritis, has seen ortho for injections, declined surgery.   BMI is Body mass index is 22.31 kg/m., she has not been working on diet and exercise due to cold weather other than up and down stairs in her home.  Wt Readings from Last 3 Encounters:  04/08/19 120 lb (54.4 kg)  12/17/18 122 lb (55.3 kg)  12/01/18 121 lb (54.9 kg)   Her blood pressure has been controlled at home, today their BP is BP: 130/78 She does workout. She denies chest pain, shortness of breath, dizziness.    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 Avoids NSAIDs and pushing water getting 65 fluid ounces daily  Lab Results  Component Value Date   GFRNONAA 67 12/17/2018   She is on cholesterol medication, lipitor 40 mg tab 3 days a week RX reflects this. .Her cholesterol is not at goal. The cholesterol last visit was:   Lab Results  Component Value Date   CHOL 188 12/17/2018   HDL 59 12/17/2018   LDLCALC 108 (H) 12/17/2018   TRIG 118 12/17/2018   CHOLHDL 3.2 12/17/2018     Last A1C in the office was:  Lab Results  Component Value Date   HGBA1C 5.2 12/17/2018   Patient is on Vitamin D supplement. Lab Results  Component Value Date   VD25OH 65 12/17/2018   She has benign essential tremor.  She is on thyroid medication following partial thyroidectomy for thyroid tumor. Her medication was not changed last visit, she is on 1/2 pill rest of the week and 1 pill M,W,F.  Patient denies nervousness and palpitations.  Lab Results  Component Value  Date   TSH 1.76 12/17/2018      Medication Review Current Outpatient Medications on File Prior to Visit  Medication Sig Dispense Refill  . aspirin 81 MG tablet Take 81 mg by mouth daily.    . Biotin w/ Vitamins C & E (HAIR/SKIN/NAILS PO) Take by mouth.    . latanoprost (XALATAN) 0.005 % ophthalmic solution Place 1 drop into both eyes at bedtime.    Marland Kitchen levothyroxine (SYNTHROID, LEVOTHROID) 100 MCG tablet Take 1 tablet every morning on an empty stomach with only water for 30 minutes & No Antacid Medications for 4 hours (Patient taking differently: Takes one tablet on M,W,F and 1/2 tablet all other days) 90 tablet 3  . Magnesium 250 MG TABS Take 1 tablet by mouth daily as needed. Takes as needed for muscle cramps    . meclizine (ANTIVERT) 25 MG tablet Take 1 tablet (25 mg total) by mouth 3 (three) times daily as needed for dizziness. 90 tablet 0  . Multiple Vitamins-Minerals (MULTIVITAMIN PO) Take by mouth daily.    Marland Kitchen OVER THE COUNTER MEDICATION OTC Fiber Gummies  OR   Benefiber.    Marland Kitchen VITAMIN D, CHOLECALCIFEROL, PO Take 2,000 Int'l Units by mouth.     Marland Kitchen azelastine (ASTELIN) 0.1 % nasal spray Place 2 sprays into both nostrils 2 (two) times daily. Use in each nostril as directed (Patient not taking: Reported on 04/08/2019) 30 mL 2   No current facility-administered medications on file prior to visit.    Current Problems (verified) Patient Active Problem List   Diagnosis Date Noted  . Primary osteoarthritis of both knees 04/08/2019  . COPD (chronic obstructive pulmonary disease) ([place]) 12/14/2017  . Essential hypertension 10/27/2013  . Medication management 04/23/2013  . History of prediabetes   . Vitamin D deficiency   . History of left breast cancer 11/03/2012  . Hypothyroidism 03/22/2008  . Hyperlipidemia 03/22/2008  . G E R D 03/22/2008  . IBS 03/22/2008  . Vertigo 03/22/2008  . Thyroid Cancer,Hx 03/22/2008    Screening Tests Immunization History  Administered Date(s)  Administered  . Influenza, High Dose Seasonal PF 01/05/2013, 01/05/2015, 11/27/2015, 12/20/2016, 12/15/2017, 12/17/2018  . Pneumococcal Conjugate-13 03/02/2015  . Pneumococcal Polysaccharide-23 03/18/2008  . Td 03/18/2005  . Tdap 12/01/2018   Preventative care: Last colonoscopy: 2011 due 10 years, due but declines - will consider cologuard but not at this time  Last mammogram: 01/2019 Last pap smear/pelvic exam: 2012 declines another DEXA: 01/2019, osteopenia, spine T -2.1 Echo 2010  Prior vaccinations: TD or Tdap: 2020 Influenza: 12/2018 Pneumococcal: 2010 Prevnar 13: 2016 Shingles/Zostavax: declines due to cost  Names of Other Physician/Practitioners you currently use: 1. [facility]- here for primary care 2. Dr. Katy Fitch, eye doctor, last 2020, goes q34m 3.  Dr. Buelah Manis, dentist, last visit 2020  Patient Care Team: Unk Pinto, MD as PCP - General (Internal Medicine)   Allergies Allergies  Allergen Reactions  . Neomycin-Bacitracin Zn-Polymyx     REACTION: rash  . Propoxyphene Hcl  REACTION: syncope  . Darvon Other (See Comments)    Almost fainted when taking, over 50 years.    SURGICAL HISTORY She  has a past surgical history that includes Thyroid surgery (1986); Breast lumpectomy (2010); Colonoscopy; Cardiac catheterization (2006); Knee arthroscopy with medial menisectomy (Left, 05/25/2014); Knee arthroscopy with lateral menisectomy (Left, 05/25/2014); Chondroplasty (Left, 05/25/2014); Rotator cuff repair (Right, 05/27/2018); and Cataract extraction, bilateral (Bilateral). FAMILY HISTORY Her family history includes Cancer in her father; Cancer - Ovarian in her sister; Colon cancer in her father; Stroke in her father. SOCIAL HISTORY She  reports that she has never smoked. She has never used smokeless tobacco. She reports that she does not drink alcohol or use drugs.  MEDICARE WELLNESS OBJECTIVES: Physical activity: Current  Exercise Habits: The patient does not participate in regular exercise at present, Exercise limited by: orthopedic condition(s) Cardiac risk factors: Cardiac Risk Factors include: advanced age (>75men, >71 women);dyslipidemia;hypertension;sedentary lifestyle Depression/mood screen:   Depression screen [facility] 2/9 04/08/2019  Decreased Interest 0  Down, Depressed, Hopeless 0  PHQ - 2 Score 0    ADLs:  In your present state of health, do you have any difficulty performing the following activities: 04/08/2019  Hearing? N  Vision? N  Difficulty concentrating or making decisions? N  Walking or climbing stairs? N  Dressing or bathing? N  Doing errands, shopping? N  Some recent data might be hidden     Cognitive Testing  Alert? Yes  Normal Appearance?Yes  Oriented to person? Yes  Place? Yes   Time? Yes  Recall of three objects?  Yes  Can perform simple calculations? Yes  Displays appropriate judgment?Yes  Can read the correct time from a watch face?Yes  EOL planning: Does Patient Have a Medical Advance Directive?: Yes Type of Advance Directive: Healthcare Power of Attorney, Living will Does patient want to make changes to medical advance directive?: No - Patient declined Copy of [place] in Chart?: Yes - validated most recent copy scanned in chart (See row information)    Review of Systems  Constitutional: Negative.  Negative for malaise/fatigue and weight loss.  HENT: Negative for congestion, ear discharge, ear pain, hearing loss, nosebleeds, sinus pain, sore throat and tinnitus.   Eyes: Negative.  Negative for blurred vision and double vision.  Respiratory: Negative.  Negative for cough, sputum production, shortness of breath, wheezing and stridor.   Cardiovascular: Negative.  Negative for chest pain, palpitations, orthopnea, claudication, leg swelling and PND.  Gastrointestinal: Negative.  Negative for abdominal pain, blood in stool, constipation, diarrhea,  heartburn, melena, nausea and vomiting.  Genitourinary: Negative.   Musculoskeletal: Negative.  Negative for falls, joint pain and myalgias.  Skin: Negative.  Negative for rash.  Neurological: Negative for dizziness, tingling, sensory change, weakness and headaches.  Endo/Heme/Allergies: Negative for polydipsia.  Psychiatric/Behavioral: Negative.  Negative for depression, memory loss, substance abuse and suicidal ideas. The patient is not nervous/anxious and does not have insomnia.   All other systems reviewed and are negative.    Objective:   Blood pressure 130/78, pulse 71, temperature 97.7 F (36.5 C), height 5' 1.5" (1.562 m), weight 120 lb (54.4 kg). Body mass index is 22.31 kg/m.  General appearance: alert, no distress, WD/WN,  female HEENT: normocephalic, sclerae anicteric, TMs pearly, nares patent, no discharge or erythema, pharynx normal Oral cavity: MMM, no lesions Neck: supple, no lymphadenopathy, no thyromegaly, no masses Heart: RRR, normal S1, S2, no murmurs Lungs: CTA bilaterally, no wheezes, rhonchi, or rales Abdomen: +bs, soft, non  tender, non distended, no masses, no hepatomegaly, no splenomegaly Musculoskeletal: nontender, no swelling, no obvious deformity. Patient is able to ambulate well.  Extremities: no edema, no cyanosis, no clubbing Pulses: 2+ symmetric, upper and lower extremities, normal cap refill Neurological: alert, oriented x 3, CN2-12 intact, strength normal upper extremities and lower extremities, sensation normal throughout, DTRs 2+ throughout, no cerebellar signs, gait normal Psychiatric: normal affect, behavior normal, pleasant    Medicare Attestation I have personally reviewed: The patient's medical and social history Their use of alcohol, tobacco or illicit drugs Their current medications and supplements The patient's functional ability including ADLs,fall risks, home safety risks, cognitive, and hearing and visual impairment Diet and  physical activities Evidence for depression or mood disorders  The patient's weight, height, BMI, and visual acuity have been recorded in the chart.  I have made referrals, counseling, and provided education to the patient based on review of the above and I have provided the patient with a written personalized care plan for preventive services.      Izora Ribas, NP   04/08/2019

## 2019-04-08 ENCOUNTER — Other Ambulatory Visit: Payer: Self-pay

## 2019-04-08 ENCOUNTER — Encounter: Payer: Self-pay | Admitting: Adult Health

## 2019-04-08 ENCOUNTER — Ambulatory Visit (INDEPENDENT_AMBULATORY_CARE_PROVIDER_SITE_OTHER): Payer: PPO | Admitting: Adult Health

## 2019-04-08 VITALS — BP 130/78 | HR 71 | Temp 97.7°F | Ht 61.5 in | Wt 120.0 lb

## 2019-04-08 DIAGNOSIS — Z0000 Encounter for general adult medical examination without abnormal findings: Secondary | ICD-10-CM

## 2019-04-08 DIAGNOSIS — Z79899 Other long term (current) drug therapy: Secondary | ICD-10-CM

## 2019-04-08 DIAGNOSIS — Z0001 Encounter for general adult medical examination with abnormal findings: Secondary | ICD-10-CM

## 2019-04-08 DIAGNOSIS — Z6822 Body mass index (BMI) 22.0-22.9, adult: Secondary | ICD-10-CM

## 2019-04-08 DIAGNOSIS — I10 Essential (primary) hypertension: Secondary | ICD-10-CM | POA: Diagnosis not present

## 2019-04-08 DIAGNOSIS — Z85850 Personal history of malignant neoplasm of thyroid: Secondary | ICD-10-CM

## 2019-04-08 DIAGNOSIS — R6889 Other general symptoms and signs: Secondary | ICD-10-CM | POA: Diagnosis not present

## 2019-04-08 DIAGNOSIS — E039 Hypothyroidism, unspecified: Secondary | ICD-10-CM | POA: Diagnosis not present

## 2019-04-08 DIAGNOSIS — Z853 Personal history of malignant neoplasm of breast: Secondary | ICD-10-CM | POA: Diagnosis not present

## 2019-04-08 DIAGNOSIS — R42 Dizziness and giddiness: Secondary | ICD-10-CM | POA: Diagnosis not present

## 2019-04-08 DIAGNOSIS — K219 Gastro-esophageal reflux disease without esophagitis: Secondary | ICD-10-CM

## 2019-04-08 DIAGNOSIS — Z87898 Personal history of other specified conditions: Secondary | ICD-10-CM

## 2019-04-08 DIAGNOSIS — E559 Vitamin D deficiency, unspecified: Secondary | ICD-10-CM | POA: Diagnosis not present

## 2019-04-08 DIAGNOSIS — M170 Bilateral primary osteoarthritis of knee: Secondary | ICD-10-CM | POA: Insufficient documentation

## 2019-04-08 DIAGNOSIS — K588 Other irritable bowel syndrome: Secondary | ICD-10-CM

## 2019-04-08 DIAGNOSIS — E782 Mixed hyperlipidemia: Secondary | ICD-10-CM | POA: Diagnosis not present

## 2019-04-08 DIAGNOSIS — J449 Chronic obstructive pulmonary disease, unspecified: Secondary | ICD-10-CM | POA: Diagnosis not present

## 2019-04-08 MED ORDER — ATORVASTATIN CALCIUM 40 MG PO TABS: ORAL_TABLET | ORAL | 5 refills | Status: DC

## 2019-04-08 NOTE — Patient Instructions (Addendum)
[name] , Thank you for taking time to come for your Medicare Wellness Visit. I appreciate your ongoing commitment to your health goals. Please review the following plan we discussed and let me know if I can assist you in the future.   These are the goals we discussed: Goals    . Exercise 150 min/wk Moderate Activity       This is a list of the screening recommended for you and due dates:  Health Maintenance  Topic Date Due  . Urine Protein Check  12/17/2019  . Mammogram  01/27/2020  . Tetanus Vaccine  11/30/2028  . Flu Shot  Completed  . DEXA scan (bone density measurement)  Completed  . Pneumonia vaccines  Completed    [place] [id number]   Currently, there is a hotline to call to schedule vaccination appointments as no walk-ins will be accepted.  Number: [phone].       If an appointment is not available please go to [email or web address] to sign up for notification when additional vaccine appointments are available.   [facility] is going to start caring the vaccine for patients 7 and older so please check in with your local pharmacy to see if they will be starting to give the vaccines.     If you have further questions or concerns about the vaccine process, please visit [email or web address]  Individuals seeking information about the vaccines and state's phased distribution plan can learn more by going to - [email or web address]  [email or web address]         Osteopenia  Osteopenia is a loss of thickness (density) inside of the bones. Another name for osteopenia is low bone mass. Mild osteopenia is a normal part of aging. It is not a disease, and it does not cause symptoms. However, if you have osteopenia and continue to lose bone mass, you could develop a condition that causes the bones to become thin  and break more easily (osteoporosis). You may also lose some height, have back pain, and have a stooped posture. Although osteopenia is not a disease, making changes to your lifestyle and diet can help to prevent osteopenia from developing into osteoporosis. What are the causes? Osteopenia is caused by loss of calcium in the bones.  Bones are constantly changing. Old bone cells are continually being replaced with new bone cells. This process builds new bone. The mineral calcium is needed to build new bone and maintain bone density. Bone density is usually highest around age 86. After that, most people's bodies cannot replace all the bone they have lost with new bone. What increases the risk? You are more likely to develop this condition if:  You are older than age 72.  You are a woman who went through menopause early.  You have a long illness that keeps you in bed.  You do not get enough exercise.  You lack certain nutrients (malnutrition).  You have an overactive thyroid gland (hyperthyroidism).  You smoke.  You drink a lot of alcohol.  You are taking medicines that weaken the bones, such as steroids. What are the signs or symptoms? This condition does not cause any symptoms. You may have a slightly higher risk for bone breaks (fractures), so getting fractures more easily than normal may be an indication of osteopenia. How is this diagnosed? Your health care provider can diagnose this condition with a special type of X-ray exam that measures bone density (dual-energy X-ray absorptiometry, DEXA). This test can measure bone density in your hips, spine, and  wrists. Osteopenia has no symptoms, so this condition is usually diagnosed after a routine bone density screening test is done for osteoporosis. This routine screening is usually done for:  Women who are age 43 or older.  Men who are age 85 or older. If you have risk factors for osteopenia, you may have the screening test at an  earlier age. How is this treated? Making dietary and lifestyle changes can lower your risk for osteoporosis. If you have severe osteopenia that is close to becoming osteoporosis, your health care provider may prescribe medicines and dietary supplements such as calcium and vitamin D. These supplements help to rebuild bone density. Follow these instructions at home:   Take over-the-counter and prescription medicines only as told by your health care provider. These include vitamins and supplements.  Eat a diet that is high in calcium and vitamin D. ? Calcium is found in dairy products, beans, salmon, and leafy green vegetables like spinach and broccoli. ? Look for foods that have vitamin D and calcium added to them (fortified foods), such as orange juice, cereal, and bread.  Do 30 or more minutes of a weight-bearing exercise every day, such as walking, jogging, or playing a sport. These types of exercises strengthen the bones.  Take precautions at home to lower your risk of falling, such as: ? Keeping rooms well-lit and free of clutter, such as cords. ? Installing safety rails on stairs. ? Using rubber mats in the bathroom or other areas that are often wet or slippery.  Do not use any products that contain nicotine or tobacco, such as cigarettes and e-cigarettes. If you need help quitting, ask your health care provider.  Avoid alcohol or limit alcohol intake to no more than 1 drink a day for nonpregnant women and 2 drinks a day for men. One drink equals 12 oz of beer, 5 oz of wine, or 1 oz of hard liquor.  Keep all follow-up visits as told by your health care provider. This is important. Contact a health care provider if:  You have not had a bone density screening for osteoporosis and you are: ? A woman, age 11 or older. ? A man, age 4 or older.  You are a postmenopausal woman who has not had a bone density screening for osteoporosis.  You are older than age 75 and you want to know if  you should have bone density screening for osteoporosis. Summary  Osteopenia is a loss of thickness (density) inside of the bones. Another name for osteopenia is low bone mass.  Osteopenia is not a disease, but it may increase your risk for a condition that causes the bones to become thin and break more easily (osteoporosis).  You may be at risk for osteopenia if you are older than age 10 or if you are a woman who went through early menopause.  Osteopenia does not cause any symptoms, but it can be diagnosed with a bone density screening test.  Dietary and lifestyle changes are the first treatment for osteopenia. These may lower your risk for osteoporosis. This information is not intended to replace advice given to you by your health care provider. Make sure you discuss any questions you have with your health care provider. Document Revised: 02/14/2017 Document Reviewed: 12/11/2016 Elsevier Patient Education  2020 [employer].

## 2019-04-09 ENCOUNTER — Other Ambulatory Visit: Payer: Self-pay | Admitting: Adult Health

## 2019-04-09 LAB — LIPID PANEL
Cholesterol: 192 mg/dL (ref <200)
HDL: 50 mg/dL (ref >=50)
LDL Cholesterol (Calc): 119 mg/dL (calc) — ABNORMAL HIGH
Non-HDL Cholesterol (Calc): 142 mg/dL (calc) — ABNORMAL HIGH (ref <130)
Total CHOL/HDL Ratio: 3.8 (calc) (ref <5.0)
Triglycerides: 122 mg/dL (ref <150)

## 2019-04-09 LAB — COMPLETE METABOLIC PANEL WITH GFR
AG Ratio: 1.7 (calc) (ref 1.0–2.5)
ALT: 7 U/L (ref 6–29)
AST: 14 U/L (ref 10–35)
Albumin: 4.3 g/dL (ref 3.6–5.1)
Alkaline phosphatase (APISO): 64 U/L (ref 37–153)
BUN/Creatinine Ratio: 18 (calc) (ref 6–22)
BUN: 21 mg/dL (ref 7–25)
CO2: 30 mmol/L (ref 20–32)
Calcium: 9.4 mg/dL (ref 8.6–10.4)
Chloride: 103 mmol/L (ref 98–110)
Creat: 1.14 mg/dL — ABNORMAL HIGH (ref 0.60–0.88)
GFR, Est African American: 53 mL/min/{1.73_m2} — ABNORMAL LOW (ref >=60)
GFR, Est Non African American: 45 mL/min/{1.73_m2} — ABNORMAL LOW (ref >=60)
Globulin: 2.5 g/dL (calc) (ref 1.9–3.7)
Glucose, Bld: 101 mg/dL — ABNORMAL HIGH (ref 65–99)
Potassium: 4.4 mmol/L (ref 3.5–5.3)
Sodium: 139 mmol/L (ref 135–146)
Total Bilirubin: 0.3 mg/dL (ref 0.2–1.2)
Total Protein: 6.8 g/dL (ref 6.1–8.1)

## 2019-04-09 LAB — CBC WITH DIFFERENTIAL/PLATELET
Absolute Monocytes: 727 cells/uL (ref 200–950)
Basophils Absolute: 79 cells/uL (ref 0–200)
Basophils Relative: 1 %
Eosinophils Absolute: 103 cells/uL (ref 15–500)
Eosinophils Relative: 1.3 %
HCT: 40.5 % (ref 35.0–45.0)
Hemoglobin: 13.5 g/dL (ref 11.7–15.5)
Lymphs Abs: 1951 cells/uL (ref 850–3900)
MCH: 30.1 pg (ref 27.0–33.0)
MCHC: 33.3 g/dL (ref 32.0–36.0)
MCV: 90.4 fL (ref 80.0–100.0)
MPV: 9.3 fL (ref 7.5–12.5)
Monocytes Relative: 9.2 %
Neutro Abs: 5040 cells/uL (ref 1500–7800)
Neutrophils Relative %: 63.8 %
Platelets: 300 10*3/uL (ref 140–400)
RBC: 4.48 10*6/uL (ref 3.80–5.10)
RDW: 12.7 % (ref 11.0–15.0)
Total Lymphocyte: 24.7 %
WBC: 7.9 10*3/uL (ref 3.8–10.8)

## 2019-04-09 LAB — MAGNESIUM: Magnesium: 2.2 mg/dL (ref 1.5–2.5)

## 2019-04-09 LAB — TSH: TSH: 0.98 mIU/L (ref 0.40–4.50)

## 2019-04-09 MED ORDER — ROSUVASTATIN CALCIUM 40 MG PO TABS: ORAL_TABLET | ORAL | 1 refills | Status: DC

## 2019-04-25 ENCOUNTER — Ambulatory Visit: Payer: PPO | Attending: Internal Medicine

## 2019-04-25 DIAGNOSIS — Z23 Encounter for immunization: Secondary | ICD-10-CM | POA: Insufficient documentation

## 2019-04-25 NOTE — Progress Notes (Signed)
   Covid-19 Vaccination Clinic  Name:  Haley Shah    MRN: [id number] DOB: 1940/09/07  04/25/2019  [name] was observed post Covid-19 immunization for 15 minutes without incidence. She was provided with Vaccine Information Sheet and instruction to access the V-Safe system.   [name] was instructed to call 911 with any severe reactions post vaccine: Marland Kitchen Difficulty breathing  . Swelling of your face and throat  . A fast heartbeat  . A bad rash all over your body  . Dizziness and weakness    Immunizations Administered    Name Date Dose VIS Date Route   Pfizer COVID-19 Vaccine 04/25/2019  4:01 PM 0.3 mL 02/26/2019 Intramuscular   Manufacturer: [place]   Lot: [id number]   [place]: [id number]

## 2019-04-29 ENCOUNTER — Ambulatory Visit (INDEPENDENT_AMBULATORY_CARE_PROVIDER_SITE_OTHER): Payer: PPO | Admitting: Adult Health Nurse Practitioner

## 2019-04-29 ENCOUNTER — Other Ambulatory Visit: Payer: Self-pay

## 2019-04-29 ENCOUNTER — Encounter: Payer: Self-pay | Admitting: Adult Health Nurse Practitioner

## 2019-04-29 VITALS — BP 149/94 | HR 77 | Temp 96.1°F | Wt 119.2 lb

## 2019-04-29 DIAGNOSIS — E782 Mixed hyperlipidemia: Secondary | ICD-10-CM

## 2019-04-29 DIAGNOSIS — R0789 Other chest pain: Secondary | ICD-10-CM | POA: Diagnosis not present

## 2019-04-29 MED ORDER — ATORVASTATIN CALCIUM 40 MG PO TABS: 40.0000 mg | ORAL_TABLET | Freq: Every day | ORAL | 3 refills | Status: DC

## 2019-04-29 NOTE — Patient Instructions (Addendum)
  Stop taking the Crestor.  Restart Atorvastatin 40mg  (Lipitor) in the morning.  Take three days a week.  Once your symptoms resolve increase with a goal of 5 days a week.  Please let us know if you have any new or worsening symptoms  Seek immediate attention for any shortness of breath, palpitations or fast heart beats, increasing chest pains.

## 2019-04-29 NOTE — Progress Notes (Signed)
Assessment and Plan:  Diagnoses and all orders for this visit:  Hyperlipidemia -     atorvastatin (LIPITOR) 40 MG tablet; Take 1 tablet (40 mg total) by mouth daily.  Chest pain, other Likely related to medication change, D/C Crestor Once symptoms resolve, restart atorvastatin. Discussed hospital precautions at length and to seek immediate attention.  Contact office with any new or worsening symptoms.    Further disposition pending results of labs. Discussed med's effects and SE's.   Over 30 minutes of exam, counseling, chart review, and critical decision making was performed.   Future Appointments  Date Time Provider [place]  05/19/2019  6:15 PM [place] [facility]  07/21/2019  2:30 PM Unk Pinto, MD [facility] None  12/20/2019  9:00 AM Vicie Mutters, PA-C [facility] None    ------------------------------------------------------------------------------------------------------------------   HPI 79 y.o.female presents for Chest pain that started two weeks ago  She reports that she recently changed her Crestor 40mg  dosing from three days a week to 5 days a week.  She is having pain that is constant on the left side that wraps around and behind her right breast.  She reports that is is also present on the left side of her chest.  She reports it is constant.  She denies any pain with deep breath, cough, palpitations, shortness of breath with rest or exertion, PND, pain with ROM, lifting, nausea, vomiting, headaches or vision change.  She denies any new activities or repetitious activities.   First covid vaccination Sunday.  March 05/2019   Past Medical History:  Diagnosis Date  . Allergy   . Cancer [facility]) 2010   Left Breast  . Dyspnea    chronic-no cardiac reason  . GERD (gastroesophageal reflux disease)   . Glaucoma   . History of thyroid cancer   . Hyperlipidemia   . Hypertension    no meds  . IBS (irritable bowel syndrome)   .  Pre-diabetes   . Sciatica    RLE  . Thyroid disease    Hypo  . Vitamin D deficiency   . Wears glasses      Allergies  Allergen Reactions  . Neomycin-Bacitracin Zn-Polymyx     REACTION: rash  . Propoxyphene Hcl     REACTION: syncope  . Darvon Other (See Comments)    Almost fainted when taking, over 50 years.    Current Outpatient Medications on File Prior to Visit  Medication Sig  . aspirin 81 MG tablet Take 81 mg by mouth daily.  Marland Kitchen atorvastatin (LIPITOR) 40 MG tablet TAKE 1 TABLET BY MOUTH 3 DAYS A WEEK  . Biotin w/ Vitamins C & E (HAIR/SKIN/NAILS PO) Take by mouth.  . latanoprost (XALATAN) 0.005 % ophthalmic solution Place 1 drop into both eyes at bedtime.  Marland Kitchen levothyroxine (SYNTHROID, LEVOTHROID) 100 MCG tablet Take 1 tablet every morning on an empty stomach with only water for 30 minutes & No Antacid Medications for 4 hours (Patient taking differently: Takes one tablet on M,W,F and 1/2 tablet all other days)  . Magnesium 250 MG TABS Take 1 tablet by mouth daily as needed. Takes as needed for muscle cramps  . meclizine (ANTIVERT) 25 MG tablet Take 1 tablet (25 mg total) by mouth 3 (three) times daily as needed for dizziness.  Marland Kitchen OVER THE COUNTER MEDICATION OTC Fiber Gummies  OR   Benefiber.  Marland Kitchen VITAMIN D, CHOLECALCIFEROL, PO Take 2,000 Int'l Units by mouth.   Marland Kitchen azelastine (ASTELIN) 0.1 % nasal spray Place  2 sprays into both nostrils 2 (two) times daily. Use in each nostril as directed (Patient not taking: Reported on 04/08/2019)  . Multiple Vitamins-Minerals (MULTIVITAMIN PO) Take by mouth daily.   No current facility-administered medications on file prior to visit.    ROS: all negative except above.   Physical Exam:  BP (!) 149/94   Pulse 77   Temp (!) 96.1 F (35.6 C)   Wt 119 lb 3.2 oz (54.1 kg)   SpO2 98%   BMI 22.16 kg/m   General Appearance: Well nourished, in no apparent distress. Eyes: PERRLA, EOMs, conjunctiva no swelling or erythema Sinuses: No  Frontal/maxillary tenderness ENT/Mouth: Ext aud canals clear, TMs without erythema, bulging. No erythema, swelling, or exudate on post pharynx.  Tonsils not swollen or erythematous. Hearing normal.  Neck: Supple, thyroid normal.  Respiratory: Respiratory effort normal, BS equal bilaterally without rales, rhonchi, wheezing or stridor.  Cardio: RRR with no MRGs. Brisk peripheral pulses without edema.  Abdomen: Soft, + BS.  Non tender, no guarding, rebound, hernias, masses. Lymphatics: Non tender without lymphadenopathy.  Musculoskeletal: Full ROM, 5/5 strength, normal gait. No pain with palpation to costochondral margins.  Skin: Warm, dry without rashes, lesions, ecchymosis.  Neuro: Cranial nerves intact. Normal muscle tone, no cerebellar symptoms. Sensation intact.  Psych: Awake and oriented X 3, normal affect, Insight and Judgment appropriate.    EKG: deferred relate dto symptoms and timing with history.  Garnet Sierras, NP 12:37 PM [facility]

## 2019-05-03 ENCOUNTER — Ambulatory Visit: Payer: PPO | Attending: Internal Medicine

## 2019-05-03 DIAGNOSIS — Z20822 Contact with and (suspected) exposure to covid-19: Secondary | ICD-10-CM

## 2019-05-04 LAB — NOVEL CORONAVIRUS, NAA: SARS-CoV-2, NAA: NOT DETECTED

## 2019-05-07 ENCOUNTER — Other Ambulatory Visit: Payer: Self-pay | Admitting: Adult Health

## 2019-05-07 MED ORDER — PREDNISONE 20 MG PO TABS: ORAL_TABLET | ORAL | 0 refills | Status: DC

## 2019-05-10 ENCOUNTER — Encounter: Payer: Self-pay | Admitting: Adult Health

## 2019-05-10 ENCOUNTER — Other Ambulatory Visit: Payer: Self-pay

## 2019-05-10 ENCOUNTER — Ambulatory Visit (INDEPENDENT_AMBULATORY_CARE_PROVIDER_SITE_OTHER): Payer: PPO | Admitting: Adult Health

## 2019-05-10 VITALS — BP 158/92 | HR 80 | Temp 97.7°F | Wt 121.0 lb

## 2019-05-10 DIAGNOSIS — R944 Abnormal results of kidney function studies: Secondary | ICD-10-CM

## 2019-05-10 DIAGNOSIS — R079 Chest pain, unspecified: Secondary | ICD-10-CM | POA: Diagnosis not present

## 2019-05-10 NOTE — Patient Instructions (Signed)
  Please get chest xray tomorrow at [place]  Follow up if tenderness/pain doesn't continue to improve   Chest Wall Pain Chest wall pain is pain in or around the bones and muscles of your chest. Sometimes, an injury causes this pain. Excessive coughing or overuse of arm and chest muscles may also cause chest wall pain. Sometimes, the cause may not be known. This pain may take several weeks or longer to get better. Follow these instructions at home: Managing pain, stiffness, and swelling   If directed, put ice on the painful area: ? Put ice in a plastic bag. ? Place a towel between your skin and the bag. ? Leave the ice on for 20 minutes, 2-3 times per day. Activity  Rest as told by your health care provider.  Avoid activities that cause pain. These include any activities that use your chest muscles or your abdominal and side muscles to lift heavy items. Ask your health care provider what activities are safe for you. General instructions   Take over-the-counter and prescription medicines only as told by your health care provider.  Do not use any products that contain nicotine or tobacco, such as cigarettes, e-cigarettes, and chewing tobacco. These can delay healing after injury. If you need help quitting, ask your health care provider.  Keep all follow-up visits as told by your health care provider. This is important. Contact a health care provider if:  You have a fever.  Your chest pain becomes worse.  You have new symptoms. Get help right away if:  You have nausea or vomiting.  You feel sweaty or light-headed.  You have a cough with mucus from your lungs (sputum) or you cough up blood.  You develop shortness of breath. These symptoms may represent a serious problem that is an emergency. Do not wait to see if the symptoms will go away. Get medical help right away. Call your local emergency services (911 in the [place] Do not drive yourself to the  hospital. Summary  Chest wall pain is pain in or around the bones and muscles of your chest.  Depending on the cause, it may be treated with ice, rest, medicines, and avoiding activities that cause pain.  Contact a health care provider if you have a fever, worsening chest pain, or new symptoms.  Get help right away if you feel light-headed or you develop shortness of breath. These symptoms may be an emergency. This information is not intended to replace advice given to you by your health care provider. Make sure you discuss any questions you have with your health care provider. Document Revised: 09/04/2017 Document Reviewed: 09/04/2017 Elsevier Patient Education  2020 [employer].

## 2019-05-10 NOTE — Progress Notes (Signed)
Assessment and Plan:  Haley Shah was seen today for follow-up.  Diagnoses and all orders for this visit:  Chest pain, unspecified type Some description of chest tightness, but exam is more consistent with chest wall vs breast tenderness; however per strong patient preference will obtain EKG, troponin, d dimer after discussion EKG showed no ST elevations, NSCPT today  Obtain CXR to r/o bony lesions, continue with prednisone which seems to be helping, my impression is costochondritis best explains her sx, on review does have hx of left chest wall pain following radiation Alternately consider breast tenderness; pursue diagnostic mammogram and ultrasound if persists The patient was advised to call immediately if she has any concerning symptoms in the interval. The patient voices understanding of current treatment options and is in agreement with the current care plan.The patient knows to call the clinic with any problems, questions or concerns or go to the ER if any further progression of symptoms.  -     CBC with Differential/Platelet -     EKG 12-Lead -     Troponin I - -     D-dimer, quantitative (not at [facility]) -     DG Chest 2 View; Future  Hypertension Hx of white coat syndrome and anxious today about above despite my best efforts to reassure She hasn't been checking at home but will start doing so Call if persistently 130/80+, consider adding losartan  Monitor blood pressure at home; call if consistently over 130/80 Continue DASH diet.   Reminder to go to the ER if any CP, SOB, nausea, dizziness, severe HA, changes vision/speech, left arm numbness and tingling and jaw pain.  Abnormal renal function test Continue pushing fluids; denies NSAIDs -     BASIC METABOLIC PANEL WITH GFR   Further disposition pending results of labs. Discussed med's effects and SE's.   Over 30 minutes of exam, counseling, chart review, and critical decision making was performed.   Future Appointments  Date Time  Provider [place]  05/19/2019  6:15 PM [place] [facility]  07/21/2019  2:30 PM Unk Pinto, MD [facility] None  12/20/2019  9:00 AM Vicie Mutters, PA-C [facility] None    ------------------------------------------------------------------------------------------------------------------   HPI BP (!) 158/92   Pulse 80   Temp 97.7 F (36.5 C)   Wt 121 lb (54.9 kg)   SpO2 98%   BMI 22.49 kg/m   79 y.o.female with hx of thyroid cancer, L breast cancer s/p lumpectomy and radiation in 2010, COPD, white coat htn and hyperlipidemia presents for follow for persistent chest pain/tenderness, and follow up on kidney functions.   She reports chest pain began the day after starting stopping atorvastatin and starting crestor after our last visit;  developed left lateral chest pain/fullness and breast tenderness, and intermittently has had sensation of bilateral beast tenderness. She reports pain is persistent, but feels perhaps improved in the evenings. She does report occasional sense of chest tightness "like I can't take a deep breath." but wonders if this is anxiety related. She denies worse symptoms with exertion. Denies fatigue, dizziness, dyspnea, edema, cough. She was seen by Danton Sewer for this issue on 04/29/2019, exam was felt to be benign, crestor was stopped but per patient no significantly improvement. She contacted our office on 2/15 concerned about persistent sx, described as tenderness was unable to be seen that day, discussed prednisone which was sent in to try over the weekend. She does report improvement since initiating steroid taper. However, she remains very concerned that  this may be her heart despite very atypical presentation, would like EKG. She reports did take omeprazole regularly for a while without improvement.   She has never smoked. No family history of CVD/MI.   She does have hx of Left breast cancer in 2010, s/p lumpectomy with radiation, has been  released from cancer center, had negative follow up mammogram on 01/27/2019.   She has been pushing water intake, 64 fluid ounces daily since last week due to decreased renal functions at last check.   Lab Results  Component Value Date   GFRNONAA 45 (L) 04/08/2019   GFRNONAA 67 12/17/2018   GFRNONAA 60 12/15/2017   Lab Results  Component Value Date   CREATININE 1.14 (H) 04/08/2019   CREATININE 0.83 12/17/2018   CREATININE 0.91 12/15/2017     Past Medical History:  Diagnosis Date  . Allergy   . Cancer [facility]) 2010   Left Breast  . Dyspnea    chronic-no cardiac reason  . GERD (gastroesophageal reflux disease)   . Glaucoma   . History of thyroid cancer   . Hyperlipidemia   . Hypertension    no meds  . IBS (irritable bowel syndrome)   . Pre-diabetes   . Sciatica    RLE  . Thyroid disease    Hypo  . Vitamin D deficiency   . Wears glasses      Allergies  Allergen Reactions  . Neomycin-Bacitracin Zn-Polymyx     REACTION: rash  . Propoxyphene Hcl     REACTION: syncope  . Darvon Other (See Comments)    Almost fainted when taking, over 50 years.    Current Outpatient Medications on File Prior to Visit  Medication Sig  . aspirin 81 MG tablet Take 81 mg by mouth daily.  Marland Kitchen atorvastatin (LIPITOR) 40 MG tablet Take 1 tablet (40 mg total) by mouth daily.  . Biotin w/ Vitamins C & E (HAIR/SKIN/NAILS PO) Take by mouth.  . latanoprost (XALATAN) 0.005 % ophthalmic solution Place 1 drop into both eyes at bedtime.  Marland Kitchen levothyroxine (SYNTHROID, LEVOTHROID) 100 MCG tablet Take 1 tablet every morning on an empty stomach with only water for 30 minutes & No Antacid Medications for 4 hours (Patient taking differently: Takes one tablet on M,W,F and 1/2 tablet all other days)  . Magnesium 250 MG TABS Take 1 tablet by mouth daily as needed. Takes as needed for muscle cramps  . meclizine (ANTIVERT) 25 MG tablet Take 1 tablet (25 mg total) by mouth 3 (three) times daily as needed for  dizziness.  . Multiple Vitamins-Minerals (MULTIVITAMIN PO) Take by mouth daily.  Marland Kitchen OVER THE COUNTER MEDICATION OTC Fiber Gummies  OR   Benefiber.  . predniSONE (DELTASONE) 20 MG tablet 2 tablets daily for 3 days, 1 tablet daily for 4 days.  Marland Kitchen VITAMIN D, CHOLECALCIFEROL, PO Take 2,000 Int'l Units by mouth.    No current facility-administered medications on file prior to visit.    ROS: all negative except above.   Physical Exam:  BP (!) 158/92   Pulse 80   Temp 97.7 F (36.5 C)   Wt 121 lb (54.9 kg)   SpO2 98%   BMI 22.49 kg/m   General Appearance: Well nourished, in no apparent distress. Eyes: PERRLA, conjunctiva no swelling or erythema Sinuses: No Frontal/maxillary tenderness ENT/Mouth: Mask in place; oral exam deferred. Hearing normal.  Neck: Supple, thyroid normal.  Respiratory: Respiratory effort normal, BS equal bilaterally without rales, rhonchi, wheezing or stridor.  Cardio: RRR with  no MRGs. Brisk peripheral pulses without edema.  Abdomen: Soft, + BS.  Non tender, no guarding, rebound, hernias, masses. Breasts: right breast normal without mass, skin or nipple changes or axillary nodes, surgical scars noted left breast lumpectomy without suspicious changes. No erythema, rash, discharge. She does report generalized tenderness of bil breasts, worse on L.  Lymphatics: Non tender without lymphadenopathy.  Musculoskeletal: Full ROM, she has tenderness over sternal costochondral junctions, no palpable point tenderness, no papable bony abnormalities, 5/5 strength, normal gait.  Skin: Warm, dry without rashes, lesions, ecchymosis.  Neuro: Normal muscle tone Psych: Awake and oriented X 3, mildly anxious affect, Insight and Judgment appropriate.     Izora Ribas, NP 5:04 PM [facility]

## 2019-05-11 ENCOUNTER — Ambulatory Visit: Payer: PPO

## 2019-05-11 ENCOUNTER — Ambulatory Visit
Admission: RE | Admit: 2019-05-11 | Discharge: 2019-05-11 | Disposition: A | Discharge status: Home / Self Care | Payer: PPO | Source: Ambulatory Visit | Attending: Adult Health | Admitting: Adult Health

## 2019-05-11 DIAGNOSIS — R079 Chest pain, unspecified: Secondary | ICD-10-CM

## 2019-05-11 LAB — BASIC METABOLIC PANEL WITH GFR
BUN: 13 mg/dL (ref 7–25)
CO2: 30 mmol/L (ref 20–32)
Calcium: 10.1 mg/dL (ref 8.6–10.4)
Chloride: 104 mmol/L (ref 98–110)
Creat: 0.77 mg/dL (ref 0.60–0.88)
GFR, Est African American: 85 mL/min/{1.73_m2} (ref >=60)
GFR, Est Non African American: 73 mL/min/{1.73_m2} (ref >=60)
Glucose, Bld: 109 mg/dL — ABNORMAL HIGH (ref 65–99)
Potassium: 5 mmol/L (ref 3.5–5.3)
Sodium: 142 mmol/L (ref 135–146)

## 2019-05-11 LAB — CBC WITH DIFFERENTIAL/PLATELET
Absolute Monocytes: 876 cells/uL (ref 200–950)
Basophils Absolute: 15 cells/uL (ref 0–200)
Basophils Relative: 0.1 %
Eosinophils Absolute: 0 cells/uL — ABNORMAL LOW (ref 15–500)
Eosinophils Relative: 0 %
HCT: 41 % (ref 35.0–45.0)
Hemoglobin: 13.8 g/dL (ref 11.7–15.5)
Lymphs Abs: 1299 cells/uL (ref 850–3900)
MCH: 30.4 pg (ref 27.0–33.0)
MCHC: 33.7 g/dL (ref 32.0–36.0)
MCV: 90.3 fL (ref 80.0–100.0)
MPV: 9.9 fL (ref 7.5–12.5)
Monocytes Relative: 6 %
Neutro Abs: 12410 cells/uL — ABNORMAL HIGH (ref 1500–7800)
Neutrophils Relative %: 85 %
Platelets: 287 10*3/uL (ref 140–400)
RBC: 4.54 10*6/uL (ref 3.80–5.10)
RDW: 13.1 % (ref 11.0–15.0)
Total Lymphocyte: 8.9 %
WBC: 14.6 10*3/uL — ABNORMAL HIGH (ref 3.8–10.8)

## 2019-05-11 LAB — D-DIMER, QUANTITATIVE: D-Dimer, Quant: 0.33 mcg/mL FEU (ref <0.50)

## 2019-05-11 LAB — TROPONIN I: Troponin I: 0.01 ng/mL (ref <=0.0)

## 2019-05-17 ENCOUNTER — Ambulatory Visit: Payer: PPO | Admitting: Adult Health

## 2019-05-19 ENCOUNTER — Ambulatory Visit: Payer: PPO

## 2019-05-19 ENCOUNTER — Ambulatory Visit: Payer: PPO | Attending: Internal Medicine

## 2019-05-19 DIAGNOSIS — Z23 Encounter for immunization: Secondary | ICD-10-CM | POA: Insufficient documentation

## 2019-05-19 NOTE — Progress Notes (Signed)
   Covid-19 Vaccination Clinic  Name:  Haley Shah    MRN: [id number] DOB: 03-12-1941  05/19/2019  [name] was observed post Covid-19 immunization for 15 minutes without incident. She was provided with Vaccine Information Sheet and instruction to access the V-Safe system.   [name] was instructed to call 911 with any severe reactions post vaccine: Marland Kitchen Difficulty breathing  . Swelling of face and throat  . A fast heartbeat  . A bad rash all over body  . Dizziness and weakness   Immunizations Administered    Name Date Dose VIS Date Route   Pfizer COVID-19 Vaccine 05/19/2019 10:45 AM 0.3 mL 02/26/2019 Intramuscular   Manufacturer: [place]   Lot: [id number]   [place]: [id number]

## 2019-06-08 ENCOUNTER — Other Ambulatory Visit: Payer: Self-pay | Admitting: *Deleted

## 2019-06-08 MED ORDER — LEVOTHYROXINE SODIUM 100 MCG PO TABS: ORAL_TABLET | ORAL | 3 refills | Status: DC

## 2019-06-10 ENCOUNTER — Other Ambulatory Visit: Payer: Self-pay | Admitting: Internal Medicine

## 2019-06-10 DIAGNOSIS — K219 Gastro-esophageal reflux disease without esophagitis: Secondary | ICD-10-CM

## 2019-06-10 MED ORDER — LEVOTHYROXINE SODIUM 100 MCG PO TABS: ORAL_TABLET | ORAL | 3 refills | Status: DC

## 2019-06-30 DIAGNOSIS — Z961 Presence of intraocular lens: Secondary | ICD-10-CM | POA: Diagnosis not present

## 2019-06-30 DIAGNOSIS — H04123 Dry eye syndrome of bilateral lacrimal glands: Secondary | ICD-10-CM | POA: Diagnosis not present

## 2019-06-30 DIAGNOSIS — H02831 Dermatochalasis of right upper eyelid: Secondary | ICD-10-CM | POA: Diagnosis not present

## 2019-06-30 DIAGNOSIS — H401131 Primary open-angle glaucoma, bilateral, mild stage: Secondary | ICD-10-CM | POA: Diagnosis not present

## 2019-06-30 DIAGNOSIS — H1045 Other chronic allergic conjunctivitis: Secondary | ICD-10-CM | POA: Diagnosis not present

## 2019-06-30 DIAGNOSIS — H02834 Dermatochalasis of left upper eyelid: Secondary | ICD-10-CM | POA: Diagnosis not present

## 2019-07-20 ENCOUNTER — Encounter: Payer: Self-pay | Admitting: Internal Medicine

## 2019-07-20 NOTE — Progress Notes (Signed)
History of Present Illness:       This very nice 79 y.o. MWF presents for 3 month follow up with labile HTN, HLD, Hypothyroid, Pre-Diabetes and Vitamin D Deficiency.       Patient has been followed expectantly since 2013 for labile HTN & BP has been controlled at home. Today's BP is at goal - 122/80. Patient has had no complaints of any cardiac type chest pain, palpitations, dyspnea / orthopnea / PND, dizziness, claudication, or dependent edema.      Hyperlipidemia is not controlled with diet & Atorvastatin. Patient denies myalgias or other med SE's. Last Lipids were not at goal:  Lab Results  Component Value Date   CHOL 192 04/08/2019   HDL 50 04/08/2019   LDLCALC 119 (H) 04/08/2019   TRIG 122 04/08/2019   CHOLHDL 3.8 04/08/2019    Also, the patient has been followed expectantly for glucose intolerance  and has had no symptoms of reactive hypoglycemia, diabetic polys, paresthesias or visual blurring.  Last A1c was Normal & at goal:  Lab Results  Component Value Date   HGBA1C 5.2 12/17/2018      In 1988, patient had a partial thyroidectomy for cancer  and has been on suppressive replacement since.      Further, the patient also has history of Vitamin D Deficiency and supplements vitamin D without any suspected side-effects. Last vitamin D was at goal:  Lab Results  Component Value Date   VD25OH 65 12/17/2018    Current Outpatient Medications on File Prior to Visit  Medication Sig  . aspirin 81 MG tablet Take 81 mg by mouth daily.  Marland Kitchen atorvastatin (LIPITOR) 40 MG tablet Take 1 tablet (40 mg total) by mouth daily.  Marland Kitchen latanoprost (XALATAN) 0.005 % ophthalmic solution Place 1 drop into both eyes at bedtime.  Marland Kitchen levothyroxine (SYNTHROID) 100 MCG tablet Take 1 tablet daily on an empty stomach with only water for 30 minutes & no Antacid meds, Calcium or Magnesium for 4 hours & avoid Biotin  . meclizine (ANTIVERT) 25 MG tablet Take 1 tablet (25 mg total) by mouth 3 (three) times  daily as needed for dizziness.  . Multiple Vitamins-Minerals (MULTIVITAMIN PO) Take by mouth daily.  Marland Kitchen OVER THE COUNTER MEDICATION OTC Fiber Gummies  OR   Benefiber.  Marland Kitchen VITAMIN D, CHOLECALCIFEROL, PO Take 2,000 Int'l Units by mouth.    No current facility-administered medications on file prior to visit.    Allergies  Allergen Reactions  . Neomycin-Bacitracin Zn-Polymyx     REACTION: rash  . Propoxyphene Hcl     REACTION: syncope  . Darvon Other (See Comments)    Almost fainted when taking, over 50 years.    PMHx:   Past Medical History:  Diagnosis Date  . Allergy   . Cancer [facility]) 2010   Left Breast  . Dyspnea    chronic-no cardiac reason  . GERD (gastroesophageal reflux disease)   . Glaucoma   . History of thyroid cancer   . Hyperlipidemia   . Hypertension    no meds  . IBS (irritable bowel syndrome)   . Pre-diabetes   . Sciatica    RLE  . Thyroid disease    Hypo  . Vitamin D deficiency   . Wears glasses     Immunization History  Administered Date(s) Administered  . Influenza, High Dose Seasonal PF 01/05/2013, 01/05/2015, 11/27/2015, 12/20/2016, 12/15/2017, 12/17/2018  . PFIZER SARS-COV-2 Vaccination 04/25/2019, 05/19/2019  . Pneumococcal Conjugate-13  03/02/2015  . Pneumococcal Polysaccharide-23 03/18/2008  . Td 03/18/2005  . Tdap 12/01/2018    Past Surgical History:  Procedure Laterality Date  . BREAST LUMPECTOMY  2010   LEFT BREAST-snbx  . CARDIAC CATHETERIZATION  2006   normal coronary arteries  . CATARACT EXTRACTION, BILATERAL Bilateral    2016 and 2018, Dr. Katy Fitch   . CHONDROPLASTY Left 05/25/2014   Procedure: CHONDROPLASTY;  Surgeon: Dorna Leitz, MD;  Location: [place];  Service: Orthopedics;  Laterality: Left;  . COLONOSCOPY    . KNEE ARTHROSCOPY WITH LATERAL MENISECTOMY Left 05/25/2014   Procedure: KNEE ARTHROSCOPY WITH LATERAL MENISECTOMY;  Surgeon: Dorna Leitz, MD;  Location: [place];  Service: Orthopedics;   Laterality: Left;  . KNEE ARTHROSCOPY WITH MEDIAL MENISECTOMY Left 05/25/2014   Procedure: LEFT ARTHROSCOPY KNEE medial and lateral meniscectomy,femoral patella chondroplasty;  Surgeon: Dorna Leitz, MD;  Location: [place];  Service: Orthopedics;  Laterality: Left;  . ROTATOR CUFF REPAIR Right 05/27/2018   Dr. Berenice Primas  . THYROID SURGERY  1986   partial thyroidectomy    FHx:    Reviewed / unchanged  SHx:    Reviewed / unchanged   Systems Review:  Constitutional: Denies fever, chills, wt changes, headaches, insomnia, fatigue, night sweats, change in appetite. Eyes: Denies redness, blurred vision, diplopia, discharge, itchy, watery eyes.  ENT: Denies discharge, congestion, post nasal drip, epistaxis, sore throat, earache, hearing loss, dental pain, tinnitus, vertigo, sinus pain, snoring.  CV: Denies chest pain, palpitations, irregular heartbeat, syncope, dyspnea, diaphoresis, orthopnea, PND, claudication or edema. Respiratory: denies cough, dyspnea, DOE, pleurisy, hoarseness, laryngitis, wheezing.  Gastrointestinal: Denies dysphagia, odynophagia, heartburn, reflux, water brash, abdominal pain or cramps, nausea, vomiting, bloating, diarrhea, constipation, hematemesis, melena, hematochezia  or hemorrhoids. Genitourinary: Denies dysuria, frequency, urgency, nocturia, hesitancy, discharge, hematuria or flank pain. Musculoskeletal: Denies arthralgias, myalgias, stiffness, jt. swelling, pain, limping or strain/sprain.  Skin: Denies pruritus, rash, hives, warts, acne, eczema or change in skin lesion(s). Neuro: No weakness, tremor, incoordination, spasms, paresthesia or pain. Psychiatric: Denies confusion, memory loss or sensory loss. Endo: Denies change in weight, skin or hair change.  Heme/Lymph: No excessive bleeding, bruising or enlarged lymph nodes.  Physical Exam  BP 122/80   Pulse 80   Temp (!) 97.4 F (36.3 C)   Resp 16   Ht 5' 1.5" (1.562 m)   Wt 121 lb 3.2 oz (55 kg)    BMI 22.53 kg/m   Appears  well nourished, well groomed  and in no distress.  Eyes: PERRLA, EOMs, conjunctiva no swelling or erythema. Sinuses: No frontal/maxillary tenderness ENT/Mouth: EAC's clear, TM's nl w/o erythema, bulging. Nares clear w/o erythema, swelling, exudates. Oropharynx clear without erythema or exudates. Oral hygiene is good. Tongue normal, non obstructing. Hearing intact.  Neck: Supple. Thyroid not palpable. Car 2+/2+ without bruits, nodes or JVD. Chest: Respirations nl with BS clear & equal w/o rales, rhonchi, wheezing or stridor.  Cor: Heart sounds normal w/ regular rate and rhythm without sig. murmurs, gallops, clicks or rubs. Peripheral pulses normal and equal  without edema.  Abdomen: Soft & bowel sounds normal. Non-tender w/o guarding, rebound, hernias, masses or organomegaly.  Lymphatics: Unremarkable.  Musculoskeletal: Full ROM all peripheral extremities, joint stability, 5/5 strength and normal gait.  Skin: Warm, dry without exposed rashes, lesions or ecchymosis apparent.  Neuro: Cranial nerves intact, reflexes equal bilaterally. Sensory-motor testing grossly intact. Tendon reflexes grossly intact.  Pysch: Alert & oriented x 3.  Insight and judgement nl & appropriate.  No ideations.  Assessment and Plan:  1. Labile hypertension  - Continue medication, monitor blood pressure at home.  - Continue DASH diet.  Reminder to go to the ER if any CP,  SOB, nausea, dizziness, severe HA, changes vision/speech.  - CBC with Differential/Platelet - COMPLETE METABOLIC PANEL WITH GFR - Magnesium - TSH  2. Hyperlipidemia, mixed  - Continue diet/meds, exercise,& lifestyle modifications.  - Continue monitor periodic cholesterol/liver & renal functions   - Lipid panel - TSH  3. Abnormal glucose  - Continue diet, exercise  - Lifestyle modifications.  - Monitor appropriate labs.  - Hemoglobin A1c - Insulin, random  4. Vitamin D deficiency  - Continue  supplementation.  - VITAMIN D 25 Hydroxy  5. Hypothyroidism,  - TSH  6. Medication management  - CBC with Differential/Platelet - COMPLETE METABOLIC PANEL WITH GFR - Magnesium - Lipid panel - TSH - Hemoglobin A1c - Insulin, random - VITAMIN D 25 Hydroxy        Discussed  regular exercise, BP monitoring, weight control to achieve/maintain BMI less than 25 and discussed med and SE's. Recommended labs to assess and monitor clinical status with further disposition pending results of labs.  I discussed the assessment and treatment plan with the patient. The patient was provided an opportunity to ask questions and all were answered. The patient agreed with the plan and demonstrated an understanding of the instructions.  I provided over 30 minutes of exam, counseling, chart review and  complex critical decision making.         The patient was advised to call back or seek an in-person evaluation if the symptoms worsen or if the condition fails to improve as anticipated.   Kirtland Bouchard, MD

## 2019-07-20 NOTE — Patient Instructions (Signed)

## 2019-07-21 ENCOUNTER — Other Ambulatory Visit: Payer: Self-pay

## 2019-07-21 ENCOUNTER — Ambulatory Visit (INDEPENDENT_AMBULATORY_CARE_PROVIDER_SITE_OTHER): Payer: PPO | Admitting: Internal Medicine

## 2019-07-21 VITALS — BP 122/80 | HR 80 | Temp 97.4°F | Resp 16 | Ht 61.5 in | Wt 121.2 lb

## 2019-07-21 DIAGNOSIS — R7309 Other abnormal glucose: Secondary | ICD-10-CM

## 2019-07-21 DIAGNOSIS — E039 Hypothyroidism, unspecified: Secondary | ICD-10-CM | POA: Diagnosis not present

## 2019-07-21 DIAGNOSIS — E782 Mixed hyperlipidemia: Secondary | ICD-10-CM | POA: Diagnosis not present

## 2019-07-21 DIAGNOSIS — E559 Vitamin D deficiency, unspecified: Secondary | ICD-10-CM

## 2019-07-21 DIAGNOSIS — Z79899 Other long term (current) drug therapy: Secondary | ICD-10-CM | POA: Diagnosis not present

## 2019-07-21 DIAGNOSIS — R0989 Other specified symptoms and signs involving the circulatory and respiratory systems: Secondary | ICD-10-CM

## 2019-07-22 LAB — COMPLETE METABOLIC PANEL WITH GFR
AG Ratio: 1.8 (calc) (ref 1.0–2.5)
ALT: 7 U/L (ref 6–29)
AST: 16 U/L (ref 10–35)
Albumin: 4.4 g/dL (ref 3.6–5.1)
Alkaline phosphatase (APISO): 72 U/L (ref 37–153)
BUN/Creatinine Ratio: 14 (calc) (ref 6–22)
BUN: 13 mg/dL (ref 7–25)
CO2: 28 mmol/L (ref 20–32)
Calcium: 9.3 mg/dL (ref 8.6–10.4)
Chloride: 102 mmol/L (ref 98–110)
Creat: 0.91 mg/dL — ABNORMAL HIGH (ref 0.60–0.88)
GFR, Est African American: 69 mL/min/{1.73_m2} (ref >=60)
GFR, Est Non African American: 60 mL/min/{1.73_m2} (ref >=60)
Globulin: 2.4 g/dL (calc) (ref 1.9–3.7)
Glucose, Bld: 81 mg/dL (ref 65–99)
Potassium: 4.3 mmol/L (ref 3.5–5.3)
Sodium: 139 mmol/L (ref 135–146)
Total Bilirubin: 0.5 mg/dL (ref 0.2–1.2)
Total Protein: 6.8 g/dL (ref 6.1–8.1)

## 2019-07-22 LAB — LIPID PANEL
Cholesterol: 178 mg/dL (ref <200)
HDL: 51 mg/dL (ref >=50)
LDL Cholesterol (Calc): 106 mg/dL (calc) — ABNORMAL HIGH
Non-HDL Cholesterol (Calc): 127 mg/dL (calc) (ref <130)
Total CHOL/HDL Ratio: 3.5 (calc) (ref <5.0)
Triglycerides: 115 mg/dL (ref <150)

## 2019-07-22 LAB — HEMOGLOBIN A1C
Hgb A1c MFr Bld: 5.1 % of total Hgb (ref <5.7)
Mean Plasma Glucose: 100 (calc)
eAG (mmol/L): 5.5 (calc)

## 2019-07-22 LAB — CBC WITH DIFFERENTIAL/PLATELET
Absolute Monocytes: 808 cells/uL (ref 200–950)
Basophils Absolute: 68 cells/uL (ref 0–200)
Basophils Relative: 0.8 %
Eosinophils Absolute: 102 cells/uL (ref 15–500)
Eosinophils Relative: 1.2 %
HCT: 41.7 % (ref 35.0–45.0)
Hemoglobin: 13.5 g/dL (ref 11.7–15.5)
Lymphs Abs: 1845 cells/uL (ref 850–3900)
MCH: 29.5 pg (ref 27.0–33.0)
MCHC: 32.4 g/dL (ref 32.0–36.0)
MCV: 91.2 fL (ref 80.0–100.0)
MPV: 9.7 fL (ref 7.5–12.5)
Monocytes Relative: 9.5 %
Neutro Abs: 5678 cells/uL (ref 1500–7800)
Neutrophils Relative %: 66.8 %
Platelets: 271 10*3/uL (ref 140–400)
RBC: 4.57 10*6/uL (ref 3.80–5.10)
RDW: 13 % (ref 11.0–15.0)
Total Lymphocyte: 21.7 %
WBC: 8.5 10*3/uL (ref 3.8–10.8)

## 2019-07-22 LAB — MAGNESIUM: Magnesium: 2.1 mg/dL (ref 1.5–2.5)

## 2019-07-22 LAB — TSH: TSH: 2.01 mIU/L (ref 0.40–4.50)

## 2019-07-22 LAB — VITAMIN D 25 HYDROXY (VIT D DEFICIENCY, FRACTURES): Vit D, 25-Hydroxy: 57 ng/mL (ref 30–100)

## 2019-07-22 LAB — INSULIN, RANDOM: Insulin: 5.1 u[IU]/mL

## 2019-07-28 DIAGNOSIS — L821 Other seborrheic keratosis: Secondary | ICD-10-CM | POA: Diagnosis not present

## 2019-07-28 DIAGNOSIS — D485 Neoplasm of uncertain behavior of skin: Secondary | ICD-10-CM | POA: Diagnosis not present

## 2019-07-28 DIAGNOSIS — L570 Actinic keratosis: Secondary | ICD-10-CM | POA: Diagnosis not present

## 2019-07-28 DIAGNOSIS — Z85828 Personal history of other malignant neoplasm of skin: Secondary | ICD-10-CM | POA: Diagnosis not present

## 2019-10-02 ENCOUNTER — Other Ambulatory Visit: Payer: Self-pay | Admitting: Oncology

## 2019-12-20 ENCOUNTER — Encounter: Payer: PPO | Admitting: Physician Assistant

## 2019-12-21 ENCOUNTER — Other Ambulatory Visit: Payer: Self-pay | Admitting: Internal Medicine

## 2019-12-21 MED ORDER — AZITHROMYCIN 250 MG PO TABS: ORAL_TABLET | ORAL | 0 refills | Status: DC

## 2019-12-21 MED ORDER — DEXAMETHASONE 4 MG PO TABS
ORAL_TABLET | ORAL | 0 refills | Status: DC
Start: 2019-12-21 — End: 2020-01-17

## 2019-12-28 ENCOUNTER — Other Ambulatory Visit: Payer: Self-pay | Admitting: Internal Medicine

## 2019-12-28 MED ORDER — BENZONATATE 200 MG PO CAPS: ORAL_CAPSULE | ORAL | 1 refills | Status: DC

## 2020-01-03 ENCOUNTER — Other Ambulatory Visit: Payer: Self-pay | Admitting: Internal Medicine

## 2020-01-03 ENCOUNTER — Telehealth: Payer: Self-pay | Admitting: *Deleted

## 2020-01-03 MED ORDER — PSEUDOEPHEDRINE HCL ER 120 MG PO TB12: ORAL_TABLET | ORAL | 0 refills | Status: DC

## 2020-01-03 NOTE — Telephone Encounter (Signed)
Left message to inform patient, Dr Melford Aase sent in an RX for Sudafed for the patient to take to help with her head congestion.

## 2020-01-12 DIAGNOSIS — H26492 Other secondary cataract, left eye: Secondary | ICD-10-CM | POA: Diagnosis not present

## 2020-01-12 DIAGNOSIS — H401131 Primary open-angle glaucoma, bilateral, mild stage: Secondary | ICD-10-CM | POA: Diagnosis not present

## 2020-01-12 DIAGNOSIS — H02831 Dermatochalasis of right upper eyelid: Secondary | ICD-10-CM | POA: Diagnosis not present

## 2020-01-12 DIAGNOSIS — H02834 Dermatochalasis of left upper eyelid: Secondary | ICD-10-CM | POA: Diagnosis not present

## 2020-01-12 DIAGNOSIS — H04123 Dry eye syndrome of bilateral lacrimal glands: Secondary | ICD-10-CM | POA: Diagnosis not present

## 2020-01-12 DIAGNOSIS — H1045 Other chronic allergic conjunctivitis: Secondary | ICD-10-CM | POA: Diagnosis not present

## 2020-01-12 DIAGNOSIS — Z961 Presence of intraocular lens: Secondary | ICD-10-CM | POA: Diagnosis not present

## 2020-01-14 NOTE — Progress Notes (Signed)
CPE   Assessment:    Encounter for annual wellness visit Due 1 year; declines colonoscopy   History of prediabetes Recent A1Cs at goal Discussed diet/exercise, weight management   -    A1C  OSTEOPENIA Due for DEXA 2022, cont vitamin D, calcium, weight bearing exerces   HYPERLIPIDEMIA  -continue medications, LDL goal <100, check lipids, decrease fatty foods, increase activity.  - TSH - CMP/GFR - Lipid panel   Allergy Avoid triggerd, OTC agent PRN, monitor  Vitamin D deficiency - Vit D  25 hydroxy (rtn osteoporosis monitoring)  Encounter for long-term (current) use of other medications - Magnesium  History of breast cancer Continue MGM  Essential hypertension Recently fairly controlled without medicatoins Monitor blood pressure at home; call if consistently over 130/80 Continue DASH diet.   Reminder to go to the ER if any CP, SOB, nausea, dizziness, severe HA, changes vision/speech, left arm numbness and tingling and jaw pain. -     CBC with Differential/Platelet -     CMP/GFR -     TSH -     EKG -     UA -     Microalbumin  Thyroid Cancer,Hx S/p resection remotely;  Continue thyroid replacement   HYPOTHYROIDISM -check TSH level, continue medications the same, reminded to take on an empty stomach 30-54mins before food.  - TSH  Gastroesophageal reflux disease, esophagitis presence not specified Fairly managed off of medications recently  Discussed diet, avoiding triggers and other lifestyle changes  Other irritable bowel syndrome If not on benefiber then add it, decrease stress,  if any worsening symptoms, blood in stool, AB pain, etc call office  COPD By imaging, no smoking/exposure history, age related Denies sx; monitor  Bil knee arthritis Has seen ortho for injections; declined surgery; recommended tylenol, topical voltaren PRN  Need for influenza vaccine High dose quadrivalent administered without complication today   Colon cancer  screening Age 65, last colonoscopy 10 years, due for follow up  Discussed at length today; she does have excellent life expectancy of 10-20 years Colon cancer is 3rd most diagnosed cancer and 2nd leading cause of death in both men and women 58 years of age and older. With excellent life expectancy may screen up to age 73. Patient understands the risk of cancer and will consider but declines colonoscopy or cologuard today.   Orders Placed This Encounter  Procedures  . Flu vaccine HIGH DOSE PF  . CBC with Differential/Platelet  . COMPLETE METABOLIC PANEL WITH GFR  . Magnesium  . Lipid panel  . TSH  . Hemoglobin A1c  . VITAMIN D 25 Hydroxy (Vit-D Deficiency, Fractures)  . Microalbumin / creatinine urine ratio  . Urinalysis, Routine w reflex microscopic  . Vitamin B12  . EKG 12-Lead     Future Appointments  Date Time Provider [place]  06/08/2020 10:30 AM Liane Comber, NP [facility] None  01/16/2021 10:00 AM Liane Comber, NP [facility] None     Subjective:   Haley Shah is a 79 y.o. female who presents for CPE. She has Hypothyroidism; Hyperlipidemia; G E R D; IBS; Vertigo; Thyroid Cancer,Hx; History of left breast cancer; Other abnormal glucose (hx of prediabetes); Vitamin D deficiency; Medication management; Essential hypertension; COPD (chronic obstructive pulmonary disease) ([place]); and Primary osteoarthritis of both knees on their problem list.  She is married, 2 children, 5 grandchilren, 1 great granchild, all local. 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   Has history of left breast cancer in 2009,  continues with annual mammograms, has upcoming scheduled in a few weeks.   She has COPD/hyperinflation by imaging on CXR since 2010; denies history of smoking, denies dyspnea, cough, secretions.   She has bil knee arthritis, has seen ortho for injections, declined surgery. Does have some pain, takes tylenol PRN if will be walking a lot but  prefers to avoid medication, will use topicals occasional.   She has benign essential tremor, mild, worse in AM, declines medication.   BMI is Body mass index is 24.02 kg/m., she has not been working on diet and exercise due to cold weather other than up and down stairs in her home.  Pushes water, 64 fluid ounces most days Wt Readings from Last 3 Encounters:  01/17/20 123 lb (55.8 kg)  07/21/19 121 lb 3.2 oz (55 kg)  05/10/19 121 lb (54.9 kg)   Her blood pressure has been controlled at home, today their BP is BP: 130/80 She does not workout. She denies chest pain, shortness of breath, dizziness.    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 Avoids NSAIDs and pushing water getting 65 fluid ounces daily  Lab Results  Component Value Date   GFRNONAA 60 07/21/2019   She is on cholesterol medication, rosuvastatin 40 mg tab 5 days a week RX updated to relfect this .Her cholesterol is not at goal. The cholesterol last visit was:   Lab Results  Component Value Date   CHOL 178 07/21/2019   HDL 51 07/21/2019   LDLCALC 106 (H) 07/21/2019   TRIG 115 07/21/2019   CHOLHDL 3.5 07/21/2019     Last A1C in the office was:  Lab Results  Component Value Date   HGBA1C 5.1 07/21/2019   Patient is on Vitamin D supplement. Lab Results  Component Value Date   VD25OH 6 07/21/2019   She is on thyroid medication following partial thyroidectomy for thyroid tumor. Her medication was not changed last visit, she is on 1/2 pill rest of the week and 1 pill M,W,F.  Patient denies nervousness and palpitations.  Lab Results  Component Value Date   TSH 2.01 07/21/2019      Medication Review Current Outpatient Medications on File Prior to Visit  Medication Sig Dispense Refill  . aspirin 81 MG tablet Take 81 mg by mouth daily.    Marland Kitchen latanoprost (XALATAN) 0.005 % ophthalmic solution Place 1 drop into both eyes at bedtime.    Marland Kitchen levothyroxine (SYNTHROID) 100 MCG tablet Take 1 tablet daily on an empty stomach with only water  for 30 minutes & no Antacid meds, Calcium or Magnesium for 4 hours & avoid Biotin 90 tablet 3  . OVER THE COUNTER MEDICATION OTC Fiber Gummies  OR   Benefiber.    . rosuvastatin (CRESTOR) 40 MG tablet Take 40 mg by mouth See admin instructions. Takes 5 days a week    . VITAMIN D, CHOLECALCIFEROL, PO Take 2,000 Int'l Units by mouth.      No current facility-administered medications on file prior to visit.    Current Problems (verified) Patient Active Problem List   Diagnosis Date Noted  . Primary osteoarthritis of both knees 04/08/2019  . COPD (chronic obstructive pulmonary disease) ([place]) 12/14/2017  . Essential hypertension 10/27/2013  . Medication management 04/23/2013  . Other abnormal glucose (hx of prediabetes)   . Vitamin D deficiency   . History of left breast cancer 11/03/2012  . Hypothyroidism 03/22/2008  . Hyperlipidemia 03/22/2008  . G E R D 03/22/2008  . IBS 03/22/2008  .  Vertigo 03/22/2008  . Thyroid Cancer,Hx 03/22/2008    Screening Tests Immunization History  Administered Date(s) Administered  . Influenza, High Dose Seasonal PF 01/05/2013, 01/05/2015, 11/27/2015, 12/20/2016, 12/15/2017, 12/17/2018, 01/17/2020  . PFIZER SARS-COV-2 Vaccination 04/25/2019, 05/19/2019  . Pneumococcal Conjugate-13 03/02/2015  . Pneumococcal Polysaccharide-23 03/18/2008  . Td 03/18/2005  . Tdap 12/01/2018   Preventative care: Last colonoscopy: 2011 due 10 years, declines - will consider but not at this time; discussed risk of colon cancer, she does have excellent life expectancy  Last mammogram: 01/2019, has scheduled Last pap smear/pelvic exam: 2012 declines another DEXA: 01/2019, osteopenia, spine T -2.1  Echo 2010 CXR 04/2019   Prior vaccinations: TD or Tdap: 2020 Influenza: 12/2018 TODAY  Pneumococcal: 2010 Prevnar 13: 2016 Shingles/Zostavax: declines due to cost Covid 19: 2/2, 2021, pfizer  Names of Other Physician/Practitioners you currently use: 1. [facility]- here for primary care 2. Dr. Katy Fitch, eye doctor, last 2021, goes q15m 3.  Dr. Buelah Manis, dentist, last visit 2021 4. Dr. Danella Sensing, derm, last visit 2021, goes annually for full body checks  Patient Care Team: Unk Pinto, MD as PCP - General (Internal Medicine)   Allergies Allergies  Allergen Reactions  . Neomycin-Bacitracin Zn-Polymyx     REACTION: rash  . Propoxyphene Hcl     REACTION: syncope  . Darvon Other (See Comments)    Almost fainted when taking, over 50 years.    SURGICAL HISTORY She  has a past surgical history that includes Thyroid surgery (1986); Breast lumpectomy (2010); Colonoscopy; Cardiac catheterization (2006); Knee arthroscopy with medial menisectomy (Left, 05/25/2014); Knee arthroscopy with lateral menisectomy (Left, 05/25/2014); Chondroplasty (Left, 05/25/2014); Rotator cuff repair (Right, 05/27/2018); and Cataract extraction, bilateral (Bilateral). FAMILY HISTORY Her family history includes Cancer in her father; Colon cancer in her father; Kidney disease in her paternal grandmother; Ovarian cancer in her sister; Stroke in her father and paternal grandfather. SOCIAL HISTORY She  reports that she has never smoked. She has never used smokeless tobacco. She reports that she does not drink alcohol and does not use drugs.  Depression/mood screen:   Depression screen PHQ 2/9 01/17/2020  Decreased Interest 0  Down, Depressed, Hopeless 0  PHQ - 2 Score 0     Review of Systems  Constitutional: Negative for malaise/fatigue and weight loss.  HENT: Negative for hearing loss and tinnitus.   Eyes: Negative for blurred vision and double vision.  Respiratory: Negative for cough, sputum production, shortness of breath and wheezing.   Cardiovascular: Negative for chest pain, palpitations, orthopnea, claudication, leg swelling and PND.  Gastrointestinal: Negative for abdominal pain, blood in stool, constipation, diarrhea, heartburn,  melena, nausea and vomiting.  Genitourinary: Negative.   Musculoskeletal: Negative for falls, joint pain and myalgias.  Skin: Negative for rash.  Neurological: Negative for dizziness, tingling, sensory change, weakness and headaches.  Endo/Heme/Allergies: Negative for polydipsia.  Psychiatric/Behavioral: Negative.  Negative for depression, memory loss, substance abuse and suicidal ideas. The patient is not nervous/anxious and does not have insomnia.   All other systems reviewed and are negative.    Objective:   Blood pressure 130/80, pulse 67, temperature (!) 97.3 F (36.3 C), height 5' (1.524 m), weight 123 lb (55.8 kg), SpO2 99 %. Body mass index is 24.02 kg/m.  General appearance: alert, no distress, WD/WN,  female HEENT: normocephalic, sclerae anicteric, TMs pearly, nares patent, no discharge or erythema, pharynx normal Oral cavity: MMM, no lesions Neck: supple, no lymphadenopathy, no thyromegaly, no masses Heart: RRR, normal  S1, S2, no murmurs Lungs: CTA bilaterally, no wheezes, rhonchi, or rales Abdomen: +bs, soft, non tender, non distended, no masses, no hepatomegaly, no splenomegaly Musculoskeletal: nontender, no swelling, no obvious deformity. Patient is able to ambulate well.  Extremities: no edema, no cyanosis, no clubbing Pulses: 2+ symmetric, upper and lower extremities, normal cap refill Neurological: alert, oriented x 3, CN2-12 intact, strength normal upper extremities and lower extremities, sensation normal throughout, DTRs 2+ throughout, no cerebellar signs, gait normal Psychiatric: normal affect, behavior normal, pleasant  Breasts: breasts appear normal, no suspicious masses, no skin or nipple changes or axillary nodes, right breast normal without mass, skin or nipple changes or axillary nodes, surgical scars noted, left with well healed surgical scar however with extensive areas of scarring in breast limiting exam; no palpable masses GU: defer, no concerns Skin:  warm, dry, intact; no concerning rash, lesions;   EKG: Sinus rhythmn, IRBBB, NSCPT   Izora Ribas, NP   01/17/2020

## 2020-01-17 ENCOUNTER — Encounter: Payer: Self-pay | Admitting: Adult Health

## 2020-01-17 ENCOUNTER — Other Ambulatory Visit: Payer: Self-pay

## 2020-01-17 ENCOUNTER — Ambulatory Visit (INDEPENDENT_AMBULATORY_CARE_PROVIDER_SITE_OTHER): Payer: PPO | Admitting: Adult Health

## 2020-01-17 VITALS — BP 130/80 | HR 67 | Temp 97.3°F | Ht 60.0 in | Wt 123.0 lb

## 2020-01-17 DIAGNOSIS — Z131 Encounter for screening for diabetes mellitus: Secondary | ICD-10-CM

## 2020-01-17 DIAGNOSIS — Z0000 Encounter for general adult medical examination without abnormal findings: Secondary | ICD-10-CM | POA: Diagnosis not present

## 2020-01-17 DIAGNOSIS — E039 Hypothyroidism, unspecified: Secondary | ICD-10-CM

## 2020-01-17 DIAGNOSIS — D649 Anemia, unspecified: Secondary | ICD-10-CM

## 2020-01-17 DIAGNOSIS — E538 Deficiency of other specified B group vitamins: Secondary | ICD-10-CM

## 2020-01-17 DIAGNOSIS — Z79899 Other long term (current) drug therapy: Secondary | ICD-10-CM

## 2020-01-17 DIAGNOSIS — Z136 Encounter for screening for cardiovascular disorders: Secondary | ICD-10-CM | POA: Diagnosis not present

## 2020-01-17 DIAGNOSIS — Z853 Personal history of malignant neoplasm of breast: Secondary | ICD-10-CM

## 2020-01-17 DIAGNOSIS — R7309 Other abnormal glucose: Secondary | ICD-10-CM

## 2020-01-17 DIAGNOSIS — Z23 Encounter for immunization: Secondary | ICD-10-CM

## 2020-01-17 DIAGNOSIS — I10 Essential (primary) hypertension: Secondary | ICD-10-CM | POA: Diagnosis not present

## 2020-01-17 DIAGNOSIS — E559 Vitamin D deficiency, unspecified: Secondary | ICD-10-CM

## 2020-01-17 DIAGNOSIS — Z85850 Personal history of malignant neoplasm of thyroid: Secondary | ICD-10-CM

## 2020-01-17 DIAGNOSIS — K219 Gastro-esophageal reflux disease without esophagitis: Secondary | ICD-10-CM

## 2020-01-17 DIAGNOSIS — M170 Bilateral primary osteoarthritis of knee: Secondary | ICD-10-CM

## 2020-01-17 DIAGNOSIS — R42 Dizziness and giddiness: Secondary | ICD-10-CM

## 2020-01-17 DIAGNOSIS — J449 Chronic obstructive pulmonary disease, unspecified: Secondary | ICD-10-CM

## 2020-01-17 DIAGNOSIS — K588 Other irritable bowel syndrome: Secondary | ICD-10-CM

## 2020-01-17 DIAGNOSIS — Z1389 Encounter for screening for other disorder: Secondary | ICD-10-CM | POA: Diagnosis not present

## 2020-01-17 DIAGNOSIS — E782 Mixed hyperlipidemia: Secondary | ICD-10-CM | POA: Diagnosis not present

## 2020-01-17 NOTE — Patient Instructions (Addendum)
Haley Shah , Thank you for taking time to come for your Annual Wellness Visit. I appreciate your ongoing commitment to your health goals. Please review the following plan we discussed and let me know if I can assist you in the future.   These are the goals we discussed: Goals    . Exercise 150 min/wk Moderate Activity       This is a list of the screening recommended for you and due dates:  Health Maintenance  Topic Date Due  . Flu Shot  10/17/2019  . Urine Protein Check  12/17/2019  . Mammogram  01/27/2020  . Tetanus Vaccine  11/30/2028  . DEXA scan (bone density measurement)  Completed  . COVID-19 Vaccine  Completed  . Pneumonia vaccines  Completed    Try voltaren or aspercreme for knees - topical, safe     Medicines you can use  Nasal congestion  Little Remedies saline spray (aerosol/mist)- can try this, it is in the kids section - pseudoephedrine (Sudafed)- behind the counter, do not use if you have high blood pressure, medicine that have -D in them.  - phenylephrine (Sudafed PE) -Dextormethorphan + chlorpheniramine (Coridcidin HBP)- okay if you have high blood pressure -Oxymetazoline (Afrin) nasal spray- LIMIT to 3 days -Saline nasal spray -Neti pot (used distilled or bottled water)      Know what a healthy weight is for you (roughly BMI <25) and aim to maintain this  Aim for 7+ servings of fruits and vegetables daily  65-80+ fluid ounces of water or unsweet tea for healthy kidneys  Limit to max 1 drink of alcohol per day; avoid smoking/tobacco  Limit animal fats in diet for cholesterol and heart health - choose grass fed whenever available  Avoid highly processed foods, and foods high in saturated/trans fats  Aim for low stress - take time to unwind and care for your mental health  Aim for 150 min of moderate intensity exercise weekly for heart health, and weights twice weekly for bone health  Aim for 7-9 hours of sleep daily       Exercising  to Stay Healthy To become healthy and stay healthy, it is recommended that you do moderate-intensity and vigorous-intensity exercise. You can tell that you are exercising at a moderate intensity if your heart starts beating faster and you start breathing faster but can still hold a conversation. You can tell that you are exercising at a vigorous intensity if you are breathing much harder and faster and cannot hold a conversation while exercising. Exercising regularly is important. It has many health benefits, such as:  Improving overall fitness, flexibility, and endurance.  Increasing bone density.  Helping with weight control.  Decreasing body fat.  Increasing muscle strength.  Reducing stress and tension.  Improving overall health. How often should I exercise? Choose an activity that you enjoy, and set realistic goals. Your health care provider can help you make an activity plan that works for you. Exercise regularly as told by your health care provider. This may include:  Doing strength training two times a week, such as: ? Lifting weights. ? Using resistance bands. ? Push-ups. ? Sit-ups. ? Yoga.  Doing a certain intensity of exercise for a given amount of time. Choose from these options: ? A total of 150 minutes of moderate-intensity exercise every week. ? A total of 75 minutes of vigorous-intensity exercise every week. ? A mix of moderate-intensity and vigorous-intensity exercise every week. Children, pregnant women, people who have not exercised  regularly, people who are overweight, and older adults may need to talk with a health care provider about what activities are safe to do. If you have a medical condition, be sure to talk with your health care provider before you start a new exercise program. What are some exercise ideas? Moderate-intensity exercise ideas include:  Walking 1 mile (1.6 km) in about 15 minutes.  Biking.  Hiking.  Golfing.  Dancing.  Water  aerobics. Vigorous-intensity exercise ideas include:  Walking 4.5 miles (7.2 km) or more in about 1 hour.  Jogging or running 5 miles (8 km) in about 1 hour.  Biking 10 miles (16.1 km) or more in about 1 hour.  Lap swimming.  Roller-skating or in-line skating.  Cross-country skiing.  Vigorous competitive sports, such as football, basketball, and soccer.  Jumping rope.  Aerobic dancing. What are some everyday activities that can help me to get exercise?  [place] work, such as: ? Pushing a Conservation officer, nature. ? Raking and bagging leaves.  Washing your car.  Pushing a stroller.  Shoveling snow.  Gardening.  Washing windows or floors. How can I be more active in my day-to-day activities?  Use stairs instead of an elevator.  Take a walk during your lunch break.  If you drive, park your car farther away from your work or school.  If you take public transportation, get off one stop early and walk the rest of the way.  Stand up or walk around during all of your indoor phone calls.  Get up, stretch, and walk around every 30 minutes throughout the day.  Enjoy exercise with a friend. Support to continue exercising will help you keep a regular routine of activity. What guidelines can I follow while exercising?  Before you start a new exercise program, talk with your health care provider.  Do not exercise so much that you hurt yourself, feel dizzy, or get very short of breath.  Wear comfortable clothes and wear shoes with good support.  Drink plenty of water while you exercise to prevent dehydration or heat stroke.  Work out until your breathing and your heartbeat get faster. Where to find more information  U.S. Department of Health and Human Services: [employer].at  Centers for Disease Control and Prevention (CDC): http://www.wolf.info/ Summary  Exercising regularly is important. It will improve your overall fitness, flexibility, and endurance.  Regular exercise also will improve  your overall health. It can help you control your weight, reduce stress, and improve your bone density.  Do not exercise so much that you hurt yourself, feel dizzy, or get very short of breath.  Before you start a new exercise program, talk with your health care provider. This information is not intended to replace advice given to you by your health care provider. Make sure you discuss any questions you have with your health care provider. Document Revised: 02/14/2017 Document Reviewed: 01/23/2017 Elsevier Patient Education  2020 [employer].

## 2020-01-18 ENCOUNTER — Encounter: Payer: Self-pay | Admitting: Adult Health

## 2020-01-18 DIAGNOSIS — E538 Deficiency of other specified B group vitamins: Secondary | ICD-10-CM | POA: Insufficient documentation

## 2020-01-18 LAB — LIPID PANEL
Cholesterol: 157 mg/dL (ref <200)
HDL: 54 mg/dL (ref >=50)
LDL Cholesterol (Calc): 82 mg/dL (calc)
Non-HDL Cholesterol (Calc): 103 mg/dL (calc) (ref <130)
Total CHOL/HDL Ratio: 2.9 (calc) (ref <5.0)
Triglycerides: 115 mg/dL (ref <150)

## 2020-01-18 LAB — CBC WITH DIFFERENTIAL/PLATELET
Absolute Monocytes: 626 cells/uL (ref 200–950)
Basophils Absolute: 62 cells/uL (ref 0–200)
Basophils Relative: 1 %
Eosinophils Absolute: 118 cells/uL (ref 15–500)
Eosinophils Relative: 1.9 %
HCT: 41.4 % (ref 35.0–45.0)
Hemoglobin: 13.5 g/dL (ref 11.7–15.5)
Lymphs Abs: 1432 cells/uL (ref 850–3900)
MCH: 29.8 pg (ref 27.0–33.0)
MCHC: 32.6 g/dL (ref 32.0–36.0)
MCV: 91.4 fL (ref 80.0–100.0)
MPV: 9.5 fL (ref 7.5–12.5)
Monocytes Relative: 10.1 %
Neutro Abs: 3962 cells/uL (ref 1500–7800)
Neutrophils Relative %: 63.9 %
Platelets: 204 10*3/uL (ref 140–400)
RBC: 4.53 10*6/uL (ref 3.80–5.10)
RDW: 13 % (ref 11.0–15.0)
Total Lymphocyte: 23.1 %
WBC: 6.2 10*3/uL (ref 3.8–10.8)

## 2020-01-18 LAB — URINALYSIS, ROUTINE W REFLEX MICROSCOPIC
Bacteria, UA: NONE SEEN /HPF
Bilirubin Urine: NEGATIVE
Glucose, UA: NEGATIVE
Hyaline Cast: NONE SEEN /LPF
Ketones, ur: NEGATIVE
Leukocytes,Ua: NEGATIVE
Nitrite: NEGATIVE
Protein, ur: NEGATIVE
RBC / HPF: NONE SEEN /HPF (ref 0–2)
Specific Gravity, Urine: 1.002 (ref 1.001–1.03)
Squamous Epithelial / HPF: NONE SEEN /HPF (ref <=5)
WBC, UA: NONE SEEN /HPF (ref 0–5)
pH: 6 (ref 5.0–8.0)

## 2020-01-18 LAB — COMPLETE METABOLIC PANEL WITH GFR
AG Ratio: 2.2 (calc) (ref 1.0–2.5)
ALT: 9 U/L (ref 6–29)
AST: 14 U/L (ref 10–35)
Albumin: 4.4 g/dL (ref 3.6–5.1)
Alkaline phosphatase (APISO): 67 U/L (ref 37–153)
BUN: 14 mg/dL (ref 7–25)
CO2: 28 mmol/L (ref 20–32)
Calcium: 9.3 mg/dL (ref 8.6–10.4)
Chloride: 107 mmol/L (ref 98–110)
Creat: 0.78 mg/dL (ref 0.60–0.88)
GFR, Est African American: 83 mL/min/{1.73_m2} (ref >=60)
GFR, Est Non African American: 72 mL/min/{1.73_m2} (ref >=60)
Globulin: 2 g/dL (calc) (ref 1.9–3.7)
Glucose, Bld: 83 mg/dL (ref 65–99)
Potassium: 5 mmol/L (ref 3.5–5.3)
Sodium: 142 mmol/L (ref 135–146)
Total Bilirubin: 0.4 mg/dL (ref 0.2–1.2)
Total Protein: 6.4 g/dL (ref 6.1–8.1)

## 2020-01-18 LAB — VITAMIN B12: Vitamin B-12: 394 pg/mL (ref 200–1100)

## 2020-01-18 LAB — MICROALBUMIN / CREATININE URINE RATIO
Creatinine, Urine: 8 mg/dL — ABNORMAL LOW (ref 20–275)
Microalb Creat Ratio: 25 mcg/mg creat (ref <30)
Microalb, Ur: 0.2 mg/dL

## 2020-01-18 LAB — MAGNESIUM: Magnesium: 2.2 mg/dL (ref 1.5–2.5)

## 2020-01-18 LAB — VITAMIN D 25 HYDROXY (VIT D DEFICIENCY, FRACTURES): Vit D, 25-Hydroxy: 59 ng/mL (ref 30–100)

## 2020-01-18 LAB — HEMOGLOBIN A1C
Hgb A1c MFr Bld: 5.5 % of total Hgb (ref <5.7)
Mean Plasma Glucose: 111 (calc)
eAG (mmol/L): 6.2 (calc)

## 2020-01-18 LAB — TSH: TSH: 0.79 mIU/L (ref 0.40–4.50)

## 2020-02-02 DIAGNOSIS — Z1231 Encounter for screening mammogram for malignant neoplasm of breast: Secondary | ICD-10-CM | POA: Diagnosis not present

## 2020-02-02 LAB — HM MAMMOGRAPHY

## 2020-02-24 ENCOUNTER — Encounter: Payer: Self-pay | Admitting: Internal Medicine

## 2020-03-01 ENCOUNTER — Other Ambulatory Visit: Payer: Self-pay | Admitting: Adult Health

## 2020-03-13 ENCOUNTER — Ambulatory Visit: Payer: PPO

## 2020-03-13 ENCOUNTER — Ambulatory Visit: Payer: PPO | Attending: Internal Medicine

## 2020-03-13 DIAGNOSIS — Z23 Encounter for immunization: Secondary | ICD-10-CM

## 2020-03-13 NOTE — Progress Notes (Signed)
   Covid-19 Vaccination Clinic  Name:  Haley Shah    MRN: [id number] DOB: [birth year]  03/13/2020  [name] was observed post Covid-19 immunization for 15 minutes without incident. She was provided with Vaccine Information Sheet and instruction to access the V-Safe system.   [name] was instructed to call 911 with any severe reactions post vaccine: Marland Kitchen Difficulty breathing  . Swelling of face and throat  . A fast heartbeat  . A bad rash all over body  . Dizziness and weakness   Immunizations Administered    Name Date Dose VIS Date Route   Pfizer COVID-19 Vaccine 03/13/2020  1:48 PM 0.3 mL 01/05/2020 Intramuscular   Manufacturer: Bernie   Lot: [id number]   [place]: [id number]

## 2020-04-21 ENCOUNTER — Encounter: Payer: Self-pay | Admitting: Hematology and Oncology

## 2020-06-07 NOTE — Progress Notes (Signed)
ANNUAL MEDICARE WELLNESS VISIT   Assessment:    Encounter for Medicare wWellness visit Due 1 year; declines colonoscopy   History of prediabetes Recent A1Cs at goal Discussed diet/exercise, weight management  Check A1C annually  OSTEOPENIA Due for DEXA 01/2021-order placed, cont vitamin D, calcium, weight bearing exerces   HYPERLIPIDEMIA  -continue medications, LDL goal <100, check lipids, decrease fatty foods, increase activity.  - TSH - CMP/GFR - Lipid panel   Allergy Avoid triggerd, OTC agent PRN, monitor  Vitamin D deficiency Continue vit D supplement for goal 60-100; check annually and PRN - Vit D  25 hydroxy (rtn osteoporosis monitoring)  Encounter for long-term (current) use of other medications - Magnesium  History of breast cancer Continue MGM  Essential hypertension Recently fairly controlled without medicatoins Monitor blood pressure at home; call if consistently over 130/80 Continue DASH diet.   Reminder to go to the ER if any CP, SOB, nausea, dizziness, severe HA, changes vision/speech, left arm numbness and tingling and jaw pain. -     CBC with Differential/Platelet -     CMP/GFR -     TSH  Thyroid Cancer,Hx S/p resection remotely;  Continue thyroid replacement   HYPOTHYROIDISM -check TSH level, continue medications the same, reminded to take on an empty stomach 30-64mins before food.  - TSH  Gastroesophageal reflux disease, esophagitis presence not specified Fairly managed off of medications recently  Discussed diet, avoiding triggers and other lifestyle changes  Other irritable bowel syndrome If not on benefiber then add it, decrease stress,  if any worsening symptoms, blood in stool, AB pain, etc call office  COPD By imaging, no smoking/exposure history, age related Denies sx; monitor  Bil knee arthritis Has seen ortho for injections; declined surgery; recommended tylenol, topical voltaren PRN  Colon cancer screening Age 25, last  colonoscopy 10 years, due for follow up  Discussed at length today; she does have excellent life expectancy of 10-20 years Colonoscopy- patient declines a colonoscopy even though the risks and benefits were discussed at length. Colon cancer is 3rd most diagnosed cancer and 2nd leading cause of death in both men and women 82 years of age and older. Patient understands the risk of cancer and death with declining the test however they are willing to do cologuard screening instead. They understand that this is not as sensitive or specific as a colonoscopy and they are still recommended to get a colonoscopy. The cologuard will be sent out to their house.    Orders Placed This Encounter  Procedures  . DG Bone Density  . CBC with Differential/Platelet  . COMPLETE METABOLIC PANEL WITH GFR  . Magnesium  . Lipid panel  . TSH  . Vitamin B12  . Urinalysis w microscopic + reflex cultur  . Cologuard     Future Appointments  Date Time Provider [place]  01/16/2021 10:00 AM Liane Comber, NP [facility] None  06/11/2021 10:30 AM Liane Comber, NP [facility] None     Subjective:   Haley Shah is a 80 y.o. female who presents for AWV and 3 month follow up. She has Hypothyroidism; Hyperlipidemia; GERD; IBS; Vertigo; Thyroid Cancer,Hx; History of left breast cancer; Other abnormal glucose (hx of prediabetes); Vitamin D deficiency; Medication management; Essential hypertension; COPD (chronic obstructive pulmonary disease) ([place]); Primary osteoarthritis of both knees; and B12 deficiency on their problem list.  She is married, 2 children, 5 grandchilren, 1 great granchild, all local. 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   Has history  of left breast cancer in 2009, continues with annual mammograms, had negative in 01/2020.   She has COPD/hyperinflation by imaging on CXR since 2010; denies history of smoking, denies dyspnea, cough, secretions.   She has bil knee  arthritis, has seen ortho for injections, declined surgery. Does have some pain, takes tylenol PRN if will be walking a lot but prefers to avoid medication, will use topicals occasional.   She has benign essential tremor, mild, worse in AM, declines medication.   BMI is Body mass index is 23.83 kg/m., she has been working on diet, admits no intentional exercise due to knees, but very active in home and yard.  Pushes water, 64 fluid ounces most days Wt Readings from Last 3 Encounters:  06/08/20 122 lb (55.3 kg)  01/17/20 123 lb (55.8 kg)  07/21/19 121 lb 3.2 oz (55 kg)   Her blood pressure has been controlled at home, today their BP is BP: 130/80 She does not workout. She denies chest pain, shortness of breath, dizziness.    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 Avoids NSAIDs and pushing water getting 65 fluid ounces daily  Lab Results  Component Value Date   GFRNONAA 72 01/17/2020   She is on cholesterol medication, rosuvastatin 40 mg tab 5 days a week.Her cholesterol is at goal. The cholesterol last visit was:   Lab Results  Component Value Date   CHOL 157 01/17/2020   HDL 54 01/17/2020   LDLCALC 82 01/17/2020   TRIG 115 01/17/2020   CHOLHDL 2.9 01/17/2020     Last A1C in the office was:  Lab Results  Component Value Date   HGBA1C 5.5 01/17/2020   Patient is on Vitamin D supplement. Lab Results  Component Value Date   VD25OH 22 01/17/2020   She is on thyroid medication following partial thyroidectomy for thyroid tumor. Her medication was not changed last visit, she is on 1/2 pill rest of the week and 1 pill M,W,F.  Patient denies nervousness and palpitations.  Lab Results  Component Value Date   TSH 0.79 01/17/2020   She reports did start on PO B12, not SL, not sure of levels.  Lab Results  Component Value Date   [id number] 734 01/17/2020     Medication Review Current Outpatient Medications on File Prior to Visit  Medication Sig Dispense Refill  . aspirin 81 MG tablet Take  81 mg by mouth daily.    . Cyanocobalamin (B-12 PO) Take by mouth daily.    Marland Kitchen latanoprost (XALATAN) 0.005 % ophthalmic solution Place 1 drop into both eyes at bedtime.    Marland Kitchen levothyroxine (SYNTHROID) 100 MCG tablet Take 1 tablet daily on an empty stomach with only water for 30 minutes & no Antacid meds, Calcium or Magnesium for 4 hours & avoid Biotin 90 tablet 3  . OVER THE COUNTER MEDICATION OTC Fiber Gummies  OR   Benefiber.    . rosuvastatin (CRESTOR) 40 MG tablet TAKE 1 TABLET BY MOUTH 5 DAYS PER WEEK OR AS DIRECTED (SKIP TUESDAY AND SATURDAY) FOR CHOLESTEROL 60 tablet 1  . VITAMIN D, CHOLECALCIFEROL, PO Take 2,000 Int'l Units by mouth.     No current facility-administered medications on file prior to visit.    Current Problems (verified) Patient Active Problem List   Diagnosis Date Noted  . B12 deficiency 01/18/2020  . Primary osteoarthritis of both knees 04/08/2019  . COPD (chronic obstructive pulmonary disease) ([place]) 12/14/2017  . Essential hypertension 10/27/2013  . Medication management 04/23/2013  . Other  abnormal glucose (hx of prediabetes)   . Vitamin D deficiency   . History of left breast cancer 11/03/2012  . Hypothyroidism 03/22/2008  . Hyperlipidemia 03/22/2008  . GERD 03/22/2008  . IBS 03/22/2008  . Vertigo 03/22/2008  . Thyroid Cancer,Hx 03/22/2008    Screening Tests Immunization History  Administered Date(s) Administered  . Influenza, High Dose Seasonal PF 01/05/2013, 01/05/2015, 11/27/2015, 12/20/2016, 12/15/2017, 12/17/2018, 01/17/2020  . PFIZER(Purple Top)SARS-COV-2 Vaccination 04/25/2019, 05/19/2019, 03/13/2020  . Pneumococcal Conjugate-13 03/02/2015  . Pneumococcal Polysaccharide-23 03/18/2008  . Td 03/18/2005  . Tdap 12/01/2018    Preventative care: Last colonoscopy: 2011 due 10 years, declines - will consider but not at this time; discussed risk of colon cancer, she does have excellent life expectancy, willing to do cologuard.  Last mammogram:  01/2020 Last pap smear/pelvic exam: 2012 declines another DEXA: 01/2019, osteopenia, spine T -2.1 due 01/2021  Echo 2010 CXR 04/2019   Prior vaccinations: TD or Tdap: 2020 Influenza: 01/2020 Pneumococcal: 2010 Prevnar 13: 2016 Shingles/Zostavax: declines Covid 19: 3/3, 2021, pfizer  Names of Other Physician/Practitioners you currently use: 1. [facility]- here for primary care 2. Dr. Katy Fitch, eye doctor, last 2022 3.  Dr. Buelah Manis, dentist, last visit 2021, has scheduled next month  4. Dr. Danella Sensing, derm, last visit 2021, goes annually for full body checks  Patient Care Team: Unk Pinto, MD as PCP - General (Internal Medicine)   Allergies Allergies  Allergen Reactions  . Neomycin-Bacitracin Zn-Polymyx     REACTION: rash  . Propoxyphene Hcl     REACTION: syncope  . [place] Other (See Comments)    Almost fainted when taking, over 50 years.    SURGICAL HISTORY She  has a past surgical history that includes Thyroid surgery (1986); Breast lumpectomy (2010); Colonoscopy; Cardiac catheterization (2006); Knee arthroscopy with medial menisectomy (Left, 05/25/2014); Knee arthroscopy with lateral menisectomy (Left, 05/25/2014); Chondroplasty (Left, 05/25/2014); Rotator cuff repair (Right, 05/27/2018); and Cataract extraction, bilateral (Bilateral). FAMILY HISTORY Her family history includes Cancer in her father; Colon cancer in her father; Kidney disease in her paternal grandmother; Ovarian cancer in her sister; Stroke in her father and paternal grandfather. SOCIAL HISTORY She  reports that she has never smoked. She has never used smokeless tobacco. She reports that she does not drink alcohol and does not use drugs.  MEDICARE WELLNESS OBJECTIVES: Physical activity: Current Exercise Habits: The patient does not participate in regular exercise at present, Exercise limited by: orthopedic condition(s) Cardiac risk factors: Cardiac Risk Factors  include: advanced age (>27men, >51 women);dyslipidemia;hypertension;sedentary lifestyle Depression/mood screen:   Depression screen [facility] 2/9 06/08/2020  Decreased Interest 0  Down, Depressed, Hopeless 0  PHQ - 2 Score 0    ADLs:  In your present state of health, do you have any difficulty performing the following activities: 06/08/2020 07/20/2019  Hearing? N N  Vision? N N  Difficulty concentrating or making decisions? N N  Walking or climbing stairs? N N  Dressing or bathing? N N  Doing errands, shopping? N N  Some recent data might be hidden     Cognitive Testing  Alert? Yes  Normal Appearance?Yes  Oriented to person? Yes  Place? Yes   Time? Yes  Recall of three objects?  Yes  Can perform simple calculations? Yes  Displays appropriate judgment?Yes  Can read the correct time from a watch face?Yes  EOL planning: Does Patient Have a Medical Advance Directive?: Yes Type of Advance Directive: [place] will Does patient want  to make changes to medical advance directive?: No - Patient declined Copy of [place] in Chart?: Yes - validated most recent copy scanned in chart (See row information)     Review of Systems  Constitutional: Negative for malaise/fatigue and weight loss.  HENT: Negative for hearing loss and tinnitus.   Eyes: Negative for blurred vision and double vision.  Respiratory: Negative for cough, sputum production, shortness of breath and wheezing.   Cardiovascular: Negative for chest pain, palpitations, orthopnea, claudication, leg swelling and PND.  Gastrointestinal: Negative for abdominal pain, blood in stool, constipation, diarrhea, heartburn, melena, nausea and vomiting.  Genitourinary: Negative.   Musculoskeletal: Negative for falls, joint pain and myalgias.  Skin: Negative for rash.  Neurological: Negative for dizziness, tingling, sensory change, weakness and headaches.  Endo/Heme/Allergies: Negative for polydipsia.   Psychiatric/Behavioral: Negative.  Negative for depression, memory loss, substance abuse and suicidal ideas. The patient is not nervous/anxious and does not have insomnia.   All other systems reviewed and are negative.    Objective:   Blood pressure 130/80, pulse 60, temperature (!) 96.8 F (36 C), weight 122 lb (55.3 kg), SpO2 99 %. Body mass index is 23.83 kg/m.  General appearance: alert, no distress, WD/WN,  female HEENT: normocephalic, sclerae anicteric, TMs pearly, nares patent, no discharge or erythema, pharynx normal Oral cavity: MMM, no lesions Neck: supple, no lymphadenopathy, no thyromegaly, no masses Heart: RRR, normal S1, S2, no murmurs Lungs: CTA bilaterally, no wheezes, rhonchi, or rales Abdomen: +bs, soft, non tender, non distended, no masses, no hepatomegaly, no splenomegaly Musculoskeletal: nontender, no swelling, no obvious deformity. Patient is able to ambulate well.  Extremities: no edema, no cyanosis, no clubbing Pulses: 2+ symmetric, upper and lower extremities, normal cap refill Neurological: alert, oriented x 3, CN2-12 intact, strength normal upper extremities and lower extremities, sensation normal throughout, DTRs 2+ throughout, no cerebellar signs, gait normal Psychiatric: normal affect, behavior normal, pleasant  Skin: warm, dry, intact; no concerning rash, lesions;    Medicare Attestation I have personally reviewed: The patient's medical and social history Their use of alcohol, tobacco or illicit drugs Their current medications and supplements The patient's functional ability including ADLs,fall risks, home safety risks, cognitive, and hearing and visual impairment Diet and physical activities Evidence for depression or mood disorders  The patient's weight, height, BMI, and visual acuity have been recorded in the chart.  I have made referrals, counseling, and provided education to the patient based on review of the above and I have provided the  patient with a written personalized care plan for preventive services.      Izora Ribas, NP   06/08/2020

## 2020-06-08 ENCOUNTER — Other Ambulatory Visit: Payer: Self-pay | Admitting: Adult Health

## 2020-06-08 ENCOUNTER — Other Ambulatory Visit: Payer: Self-pay

## 2020-06-08 ENCOUNTER — Encounter: Payer: Self-pay | Admitting: Adult Health

## 2020-06-08 ENCOUNTER — Ambulatory Visit (INDEPENDENT_AMBULATORY_CARE_PROVIDER_SITE_OTHER): Payer: PPO | Admitting: Adult Health

## 2020-06-08 VITALS — BP 130/80 | HR 60 | Temp 96.8°F | Wt 122.0 lb

## 2020-06-08 DIAGNOSIS — E782 Mixed hyperlipidemia: Secondary | ICD-10-CM

## 2020-06-08 DIAGNOSIS — Z85850 Personal history of malignant neoplasm of thyroid: Secondary | ICD-10-CM

## 2020-06-08 DIAGNOSIS — Z0000 Encounter for general adult medical examination without abnormal findings: Secondary | ICD-10-CM

## 2020-06-08 DIAGNOSIS — E538 Deficiency of other specified B group vitamins: Secondary | ICD-10-CM

## 2020-06-08 DIAGNOSIS — Z853 Personal history of malignant neoplasm of breast: Secondary | ICD-10-CM | POA: Diagnosis not present

## 2020-06-08 DIAGNOSIS — J449 Chronic obstructive pulmonary disease, unspecified: Secondary | ICD-10-CM | POA: Diagnosis not present

## 2020-06-08 DIAGNOSIS — Z79899 Other long term (current) drug therapy: Secondary | ICD-10-CM | POA: Diagnosis not present

## 2020-06-08 DIAGNOSIS — R079 Chest pain, unspecified: Secondary | ICD-10-CM | POA: Diagnosis not present

## 2020-06-08 DIAGNOSIS — R944 Abnormal results of kidney function studies: Secondary | ICD-10-CM | POA: Diagnosis not present

## 2020-06-08 DIAGNOSIS — I10 Essential (primary) hypertension: Secondary | ICD-10-CM | POA: Diagnosis not present

## 2020-06-08 DIAGNOSIS — R6889 Other general symptoms and signs: Secondary | ICD-10-CM | POA: Diagnosis not present

## 2020-06-08 DIAGNOSIS — R42 Dizziness and giddiness: Secondary | ICD-10-CM | POA: Diagnosis not present

## 2020-06-08 DIAGNOSIS — R7309 Other abnormal glucose: Secondary | ICD-10-CM | POA: Diagnosis not present

## 2020-06-08 DIAGNOSIS — M170 Bilateral primary osteoarthritis of knee: Secondary | ICD-10-CM | POA: Diagnosis not present

## 2020-06-08 DIAGNOSIS — R0989 Other specified symptoms and signs involving the circulatory and respiratory systems: Secondary | ICD-10-CM | POA: Diagnosis not present

## 2020-06-08 DIAGNOSIS — M8580 Other specified disorders of bone density and structure, unspecified site: Secondary | ICD-10-CM

## 2020-06-08 DIAGNOSIS — K588 Other irritable bowel syndrome: Secondary | ICD-10-CM

## 2020-06-08 DIAGNOSIS — K219 Gastro-esophageal reflux disease without esophagitis: Secondary | ICD-10-CM

## 2020-06-08 DIAGNOSIS — R7303 Prediabetes: Secondary | ICD-10-CM | POA: Diagnosis not present

## 2020-06-08 DIAGNOSIS — E039 Hypothyroidism, unspecified: Secondary | ICD-10-CM

## 2020-06-08 DIAGNOSIS — Z6824 Body mass index (BMI) 24.0-24.9, adult: Secondary | ICD-10-CM

## 2020-06-08 DIAGNOSIS — D649 Anemia, unspecified: Secondary | ICD-10-CM | POA: Diagnosis not present

## 2020-06-08 DIAGNOSIS — E559 Vitamin D deficiency, unspecified: Secondary | ICD-10-CM

## 2020-06-08 DIAGNOSIS — Z0001 Encounter for general adult medical examination with abnormal findings: Secondary | ICD-10-CM

## 2020-06-08 DIAGNOSIS — Z1211 Encounter for screening for malignant neoplasm of colon: Secondary | ICD-10-CM

## 2020-06-08 DIAGNOSIS — R109 Unspecified abdominal pain: Secondary | ICD-10-CM

## 2020-06-08 NOTE — Patient Instructions (Signed)
  Haley Shah , Thank you for taking time to come for your Medicare Wellness Visit. I appreciate your ongoing commitment to your health goals. Please review the following plan we discussed and let me know if I can assist you in the future.   These are the goals we discussed: Goals    . Exercise 150 min/wk Moderate Activity       This is a list of the screening recommended for you and due dates:  Health Maintenance  Topic Date Due  . Mammogram  02/01/2021  . Tetanus Vaccine  11/30/2028  . Flu Shot  Completed  . DEXA scan (bone density measurement)  Completed  . COVID-19 Vaccine  Completed  . Pneumonia vaccines  Completed  . HPV Vaccine  Aged Out    Please schedule bone density exam with your next mammogram  [facility] Schedule an appointment by calling [phone].    COLOGUARD INFORMATION   Cologuard is an easy to use noninvasive colon cancer screening test based on the latest advances in stool DNA science.   Colon cancer is 3rd most diagnosed cancer and 2nd leading cause of death in both men and women 49 years of age and older despite being one of the most preventable and treatable cancers if found early.  4 of out 5 people diagnosed with colon cancer have NO prior family history.  When caught EARLY 90% of colon cancer is curable.   More than 92% of cologuard patients have NO out of pocket cost for screening however only your insurer can confirm how Cologuard would be covered for you. Cologuard has a team of specialist that can help you contact your insurer and ask the right questions. Please call [phone] so they can help.   You will receive a short call from [place] support center at [employer], when you receive a call they will say they are from [place],  to confirm your mailing address and give you more information.  When they calll you, it will appear on the caller ID as "[employer]" or in some cases only this number will  appear, [phone].   [employer] will ship your collection kit directly to you. You will collect a single stool sample in the privacy of your own home, no special preparation required. You will return the kit via [facility] [place] pre-paid shipping or pick-up, in the same box it arrived in. Then I will contact you to discuss your results after I receive them from the laboratory.   If you have any questions or concerns, C[facility] Customer Support Specialist are available 24 hours a day, 7 days a week at [phone] or go to [email or web address].

## 2020-06-09 LAB — COMPLETE METABOLIC PANEL WITH GFR
AG Ratio: 2.1 (calc) (ref 1.0–2.5)
ALT: 9 U/L (ref 6–29)
AST: 14 U/L (ref 10–35)
Albumin: 4.7 g/dL (ref 3.6–5.1)
Alkaline phosphatase (APISO): 66 U/L (ref 37–153)
BUN: 14 mg/dL (ref 7–25)
CO2: 30 mmol/L (ref 20–32)
Calcium: 9.4 mg/dL (ref 8.6–10.4)
Chloride: 104 mmol/L (ref 98–110)
Creat: 0.78 mg/dL (ref 0.60–0.88)
GFR, Est African American: 83 mL/min/{1.73_m2} (ref >=60)
GFR, Est Non African American: 71 mL/min/{1.73_m2} (ref >=60)
Globulin: 2.2 g/dL (calc) (ref 1.9–3.7)
Glucose, Bld: 93 mg/dL (ref 65–99)
Potassium: 5.1 mmol/L (ref 3.5–5.3)
Sodium: 143 mmol/L (ref 135–146)
Total Bilirubin: 0.4 mg/dL (ref 0.2–1.2)
Total Protein: 6.9 g/dL (ref 6.1–8.1)

## 2020-06-09 LAB — TSH: TSH: 0.76 mIU/L (ref 0.40–4.50)

## 2020-06-09 LAB — CBC WITH DIFFERENTIAL/PLATELET
Absolute Monocytes: 748 cells/uL (ref 200–950)
Basophils Absolute: 84 cells/uL (ref 0–200)
Basophils Relative: 1 %
Eosinophils Absolute: 101 cells/uL (ref 15–500)
Eosinophils Relative: 1.2 %
HCT: 41.6 % (ref 35.0–45.0)
Hemoglobin: 13.7 g/dL (ref 11.7–15.5)
Lymphs Abs: 1999 cells/uL (ref 850–3900)
MCH: 29.7 pg (ref 27.0–33.0)
MCHC: 32.9 g/dL (ref 32.0–36.0)
MCV: 90.2 fL (ref 80.0–100.0)
MPV: 9.7 fL (ref 7.5–12.5)
Monocytes Relative: 8.9 %
Neutro Abs: 5468 cells/uL (ref 1500–7800)
Neutrophils Relative %: 65.1 %
Platelets: 254 10*3/uL (ref 140–400)
RBC: 4.61 10*6/uL (ref 3.80–5.10)
RDW: 12.9 % (ref 11.0–15.0)
Total Lymphocyte: 23.8 %
WBC: 8.4 10*3/uL (ref 3.8–10.8)

## 2020-06-09 LAB — URINALYSIS W MICROSCOPIC + REFLEX CULTURE
Bacteria, UA: NONE SEEN /HPF
Bilirubin Urine: NEGATIVE
Glucose, UA: NEGATIVE
Hyaline Cast: NONE SEEN /LPF
Ketones, ur: NEGATIVE
Leukocyte Esterase: NEGATIVE
Nitrites, Initial: NEGATIVE
Protein, ur: NEGATIVE
Specific Gravity, Urine: 1.001 (ref 1.001–1.03)
Squamous Epithelial / HPF: NONE SEEN /HPF (ref <=5)
WBC, UA: NONE SEEN /HPF (ref 0–5)
pH: 7 (ref 5.0–8.0)

## 2020-06-09 LAB — MAGNESIUM: Magnesium: 2.3 mg/dL (ref 1.5–2.5)

## 2020-06-09 LAB — LIPID PANEL
Cholesterol: 145 mg/dL (ref <200)
HDL: 59 mg/dL (ref >=50)
LDL Cholesterol (Calc): 67 mg/dL (calc)
Non-HDL Cholesterol (Calc): 86 mg/dL (calc) (ref <130)
Total CHOL/HDL Ratio: 2.5 (calc) (ref <5.0)
Triglycerides: 106 mg/dL (ref <150)

## 2020-06-09 LAB — NO CULTURE INDICATED

## 2020-06-09 LAB — VITAMIN B12: Vitamin B-12: 561 pg/mL (ref 200–1100)

## 2020-07-05 ENCOUNTER — Other Ambulatory Visit: Payer: Self-pay | Admitting: Adult Health

## 2020-07-05 DIAGNOSIS — Z1211 Encounter for screening for malignant neoplasm of colon: Secondary | ICD-10-CM

## 2020-07-11 DIAGNOSIS — H401131 Primary open-angle glaucoma, bilateral, mild stage: Secondary | ICD-10-CM | POA: Diagnosis not present

## 2020-07-17 ENCOUNTER — Other Ambulatory Visit: Payer: Self-pay

## 2020-07-17 ENCOUNTER — Ambulatory Visit
Admission: EM | Admit: 2020-07-17 | Discharge: 2020-07-17 | Disposition: A | Discharge status: Home / Self Care | Payer: PPO | Attending: Emergency Medicine | Admitting: Emergency Medicine

## 2020-07-17 ENCOUNTER — Ambulatory Visit (INDEPENDENT_AMBULATORY_CARE_PROVIDER_SITE_OTHER): Payer: PPO

## 2020-07-17 ENCOUNTER — Encounter: Payer: Self-pay | Admitting: Emergency Medicine

## 2020-07-17 DIAGNOSIS — W19XXXA Unspecified fall, initial encounter: Secondary | ICD-10-CM

## 2020-07-17 DIAGNOSIS — S8002XA Contusion of left knee, initial encounter: Secondary | ICD-10-CM | POA: Diagnosis not present

## 2020-07-17 DIAGNOSIS — M7989 Other specified soft tissue disorders: Secondary | ICD-10-CM | POA: Diagnosis not present

## 2020-07-17 DIAGNOSIS — M25462 Effusion, left knee: Secondary | ICD-10-CM | POA: Diagnosis not present

## 2020-07-17 DIAGNOSIS — M25562 Pain in left knee: Secondary | ICD-10-CM | POA: Diagnosis not present

## 2020-07-17 DIAGNOSIS — Z1211 Encounter for screening for malignant neoplasm of colon: Secondary | ICD-10-CM | POA: Diagnosis not present

## 2020-07-17 DIAGNOSIS — S0990XA Unspecified injury of head, initial encounter: Secondary | ICD-10-CM

## 2020-07-17 MED ORDER — MUPIROCIN 2 % EX OINT: 1.0000 "application " | TOPICAL_OINTMENT | Freq: Two times a day (BID) | CUTANEOUS | 0 refills | Status: DC

## 2020-07-17 NOTE — ED Triage Notes (Addendum)
Patient reports falling this morning around 9 am.  Tripped over watering hose and hit head on stepping stone.  No loc.  Patient has a hematoma to left forehead and scrapes.  Reports left knee scraped as well.  Patient does tale low dose aspirin every day. patient is alert and oriented x 3

## 2020-07-17 NOTE — ED Provider Notes (Signed)
[facility]    CSN: [id number] Arrival date & time: 07/17/20  1010      History   Chief Complaint Chief Complaint  Patient presents with  . Fall    HPI Haley Shah is a 80 y.o. female history of hypertension, hyperlipidemia, GERD, glaucoma, COPD, presenting today for evaluation of a fall.  Reports tripped and fell this morning around 9 AM.  Hit head on stepping stone.  Denies loss of consciousness.  Takes 81 mg aspirin daily.  Reports some mild soreness around abrasions on forehead, but overall minimal headache.  Denies vision changes.  Denies dizziness lightheadedness, weakness, difficulty speaking, confusion.  Denies history of high blood pressure.  Also with left knee pain.  Knee landed in grass.  Reports increased pain and discomfort with walking.  Reports known arthritis in bilateral knees, no history of knee replacement.  HPI  Past Medical History:  Diagnosis Date  . Allergy   . Cancer [facility]) 2010   Left Breast  . Dyspnea    chronic-no cardiac reason  . GERD (gastroesophageal reflux disease)   . Glaucoma   . History of thyroid cancer   . Hyperlipidemia   . Hypertension    no meds  . IBS (irritable bowel syndrome)   . Pre-diabetes   . Sciatica    RLE  . Thyroid disease    Hypo  . Vitamin D deficiency   . Wears glasses     Patient Active Problem List   Diagnosis Date Noted  . B12 deficiency 01/18/2020  . Primary osteoarthritis of both knees 04/08/2019  . COPD (chronic obstructive pulmonary disease) ([place]) 12/14/2017  . Essential hypertension 10/27/2013  . Medication management 04/23/2013  . Other abnormal glucose (hx of prediabetes)   . Vitamin D deficiency   . History of left breast cancer 11/03/2012  . Hypothyroidism 03/22/2008  . Hyperlipidemia 03/22/2008  . GERD 03/22/2008  . IBS 03/22/2008  . Vertigo 03/22/2008  . Thyroid Cancer,Hx 03/22/2008    Past Surgical History:  Procedure Laterality Date  . BREAST LUMPECTOMY  2010   LEFT  BREAST-snbx  . CARDIAC CATHETERIZATION  2006   normal coronary arteries  . CATARACT EXTRACTION, BILATERAL Bilateral    2016 and 2018, Dr. Katy Fitch   . CHONDROPLASTY Left 05/25/2014   Procedure: CHONDROPLASTY;  Surgeon: Dorna Leitz, MD;  Location: [place];  Service: Orthopedics;  Laterality: Left;  . COLONOSCOPY    . KNEE ARTHROSCOPY WITH LATERAL MENISECTOMY Left 05/25/2014   Procedure: KNEE ARTHROSCOPY WITH LATERAL MENISECTOMY;  Surgeon: Dorna Leitz, MD;  Location: [place];  Service: Orthopedics;  Laterality: Left;  . KNEE ARTHROSCOPY WITH MEDIAL MENISECTOMY Left 05/25/2014   Procedure: LEFT ARTHROSCOPY KNEE medial and lateral meniscectomy,femoral patella chondroplasty;  Surgeon: Dorna Leitz, MD;  Location: [place];  Service: Orthopedics;  Laterality: Left;  . ROTATOR CUFF REPAIR Right 05/27/2018   Dr. Berenice Primas  . THYROID SURGERY  1986   partial thyroidectomy    OB History   No obstetric history on file.      Home Medications    Prior to Admission medications   Medication Sig Start Date End Date Taking? Authorizing Provider  aspirin 81 MG tablet Take 81 mg by mouth daily.   Yes [provider]  Cyanocobalamin (B-12 PO) Take by mouth daily.   Yes [provider]  latanoprost (XALATAN) 0.005 % ophthalmic solution Place 1 drop into both eyes at bedtime.   Yes [provider]  levothyroxine (SYNTHROID) 100 MCG tablet Take 1 tablet daily on an empty stomach with only water for 30 minutes & no Antacid meds, Calcium or Magnesium for 4 hours & avoid Biotin 06/10/19  Yes Unk Pinto, MD  mupirocin ointment (BACTROBAN) 2 % Apply 1 application topically 2 (two) times daily. 07/17/20  Yes Zeev Deakins C, PA-C  rosuvastatin (CRESTOR) 40 MG tablet TAKE 1 TABLET BY MOUTH 5 DAYS PER WEEK OR AS DIRECTED (SKIP TUESDAY AND SATURDAY) FOR CHOLESTEROL 03/01/20  Yes Corbett, Caryl Pina, NP  VITAMIN D, CHOLECALCIFEROL, PO Take 2,000  Int'l Units by mouth.   Yes [provider]  OVER THE COUNTER MEDICATION OTC Fiber Gummies  OR   Benefiber.    [provider]    Family History Family History  Problem Relation Age of Onset  . Colon cancer Father   . Stroke Father   . Cancer Father        Pancreatic  . Ovarian cancer Sister   . Kidney disease Paternal Grandmother   . Stroke Paternal Grandfather     Social History Social History   Tobacco Use  . Smoking status: Never Smoker  . Smokeless tobacco: Never Used  Vaping Use  . Vaping Use: Never used  Substance Use Topics  . Alcohol use: No  . Drug use: No     Allergies   Naprosyn [naproxen], Neomycin-bacitracin zn-polymyx, Propoxyphene hcl, and Darvon   Review of Systems Review of Systems  Constitutional: Negative for fatigue and fever.  HENT: Negative for congestion, sinus pressure and sore throat.   Eyes: Negative for photophobia, pain and visual disturbance.  Respiratory: Negative for cough and shortness of breath.   Cardiovascular: Negative for chest pain.  Gastrointestinal: Negative for abdominal pain, nausea and vomiting.  Genitourinary: Negative for decreased urine volume and hematuria.  Musculoskeletal: Positive for arthralgias and gait problem. Negative for myalgias, neck pain and neck stiffness.  Skin: Positive for color change and wound.  Neurological: Positive for headaches. Negative for dizziness, syncope, facial asymmetry, speech difficulty, weakness, light-headedness and numbness.     Physical Exam Triage Vital Signs ED Triage Vitals [07/17/20 1210]  Enc Vitals Group     BP      Pulse      Resp      Temp      Temp src      SpO2      Weight      Height      Head Circumference      Peak Flow      Pain Score 0     Pain Loc      Pain Edu?      Excl. in [place]?    No data found.  Updated Vital Signs BP (!) 180/69 (BP Location: Right Arm)   Pulse 67   Temp 99 F (37.2 C) (Oral)   Resp 18   SpO2 98%    Visual Acuity Right Eye Distance:   Left Eye Distance:   Bilateral Distance:    Right Eye Near:   Left Eye Near:    Bilateral Near:     Physical Exam Vitals and nursing note reviewed.  Constitutional:      Appearance: She is well-developed.     Comments: No acute distress  HENT:     Head: Normocephalic and atraumatic.     Comments: Left frontal area with superficial abrasions, mild ecchymosis and mild swelling with associated tenderness    Ears:     Comments: No  hemotympanum    Nose: Nose normal.     Mouth/Throat:     Comments: Oral mucosa pink and moist, no tonsillar enlargement or exudate. Posterior pharynx patent and nonerythematous, no uvula deviation or swelling. Normal phonation.  Palate of symmetrically Eyes:     Extraocular Movements: Extraocular movements intact.     Conjunctiva/sclera: Conjunctivae normal.     Pupils: Pupils are equal, round, and reactive to light.  Cardiovascular:     Rate and Rhythm: Normal rate and regular rhythm.  Pulmonary:     Effort: Pulmonary effort is normal. No respiratory distress.     Comments: Breathing comfortably at rest, CTABL, no wheezing, rales or other adventitious sounds auscultated Abdominal:     General: There is no distension.  Musculoskeletal:        General: Normal range of motion.     Cervical back: Neck supple.     Comments: Left knee with mild erythematous abrasion noted to inferior portion of knee, nontender to palpation over patella, tender along infrapatellar area extending into medial joint line, relatively full active range of motion  Skin:    General: Skin is warm and dry.  Neurological:     Mental Status: She is alert and oriented to person, place, and time.     Comments: Patient A&O x3, cranial nerves II-XII grossly intact, strength at shoulders, hips and knees 5/5, equal bilaterally, patellar reflex 2+ bilaterally. Normal  RAM and heel to shin. Negative Romberg and Pronator Drift. Gait without abnormality.       UC Treatments / Results  Labs (all labs ordered are listed, but only abnormal results are displayed) Labs Reviewed - No data to display  EKG   Radiology DG Knee Complete 4 Views Left  Result Date: 07/17/2020 CLINICAL DATA:  [place] today.  Pain and swelling. EXAM: LEFT KNEE - COMPLETE 4+ VIEW COMPARISON:  None. FINDINGS: Small knee joint effusion. Apparent soft tissue swelling medial to the knee. Medial compartment osteoarthritis with joint space narrowing and marginal osteophytes. Lesser osteo arthritic change at the patellofemoral joint. Lateral compartment appears maintained. No evidence of acute regional fracture. IMPRESSION: Small knee joint effusion. Question medial soft tissue swelling. No acute fracture. Osteoarthritis, worse in the medial compartment than the patellofemoral joint. Electronically Signed   By: Nelson Chimes M.D.   On: 07/17/2020 13:21    Procedures Procedures (including critical care time)  Medications Ordered in UC Medications - No data to display  Initial Impression / Assessment and Plan / UC Course  I have reviewed the triage vital signs and the nursing notes.  Pertinent labs & imaging results that were available during my care of the patient were reviewed by me and considered in my medical decision making (see chart for details).  Clinical Course as of 07/17/20 1436  Mon Jul 17, 2020  1241 184/71 [HW]    Clinical Course User Index [HW] Zackary Mckeone, [place] C, PA-C    1.  Head injury- no neuro deficits on exam, takes 81 mg aspirin, no red flag symptoms, but discussed with patient given incident occurred today cannot rule out underlying intracranial hemorrhage, advised if she is developing any progressive or worsening symptoms to go to emergency room.  Also discussed elevated blood pressure, reports history of whitecoat hypertension, advised to monitor at home which she does regularly, symptoms continuing to stay high recommended also follow-up in ED.  2.   Knee injury- x-ray negative for acute bony abnormality, does show effusion, treating with Ace wrap, ice elevation  and anti-inflammatories.  Discussed strict return precautions. Patient verbalized understanding and is agreeable with plan.  Final Clinical Impressions(s) / UC Diagnoses   Final diagnoses:  Injury of head, initial encounter  Contusion of left knee, initial encounter     Discharge Instructions     Keep wounds to head clean and dry, apply Bactroban twice daily to help prevent infection Ice to help with swelling Tylenol as needed Ace wrap to knee  Please go to emergency room if developing any worsening headache, vision changes, dizziness, lightheadedness, nausea, vomiting, confusion, difficulty speaking, one-sided weakness or blood pressure remaining elevated    ED Prescriptions    Medication Sig Dispense Auth. Provider   mupirocin ointment (BACTROBAN) 2 % Apply 1 application topically 2 (two) times daily. 30 g Adalid Beckmann, [place] C, PA-C     PDMP not reviewed this encounter.   Janith Lima, [place] 07/17/20 1436

## 2020-07-17 NOTE — Discharge Instructions (Addendum)
Keep wounds to head clean and dry, apply Bactroban twice daily to help prevent infection Ice to help with swelling Tylenol as needed Ace wrap to knee  Please go to emergency room if developing any worsening headache, vision changes, dizziness, lightheadedness, nausea, vomiting, confusion, difficulty speaking, one-sided weakness or blood pressure remaining elevated

## 2020-07-18 LAB — COLOGUARD: Cologuard: NEGATIVE

## 2020-07-24 ENCOUNTER — Encounter: Payer: Self-pay | Admitting: Internal Medicine

## 2020-07-27 DIAGNOSIS — Z85828 Personal history of other malignant neoplasm of skin: Secondary | ICD-10-CM | POA: Diagnosis not present

## 2020-07-27 DIAGNOSIS — D1801 Hemangioma of skin and subcutaneous tissue: Secondary | ICD-10-CM | POA: Diagnosis not present

## 2020-07-27 DIAGNOSIS — L570 Actinic keratosis: Secondary | ICD-10-CM | POA: Diagnosis not present

## 2020-07-27 DIAGNOSIS — L821 Other seborrheic keratosis: Secondary | ICD-10-CM | POA: Diagnosis not present

## 2020-08-21 ENCOUNTER — Other Ambulatory Visit: Payer: Self-pay | Admitting: Adult Health

## 2020-08-21 ENCOUNTER — Other Ambulatory Visit: Payer: Self-pay | Admitting: Internal Medicine

## 2020-08-21 DIAGNOSIS — E039 Hypothyroidism, unspecified: Secondary | ICD-10-CM

## 2020-08-21 DIAGNOSIS — K219 Gastro-esophageal reflux disease without esophagitis: Secondary | ICD-10-CM

## 2021-01-11 DIAGNOSIS — M79671 Pain in right foot: Secondary | ICD-10-CM | POA: Diagnosis not present

## 2021-01-12 NOTE — Progress Notes (Signed)
CPE   Assessment:    Encounter for Annual Physical Exam with abnormal findings Due annually  Health Maintenance reviewed Healthy lifestyle reviewed and goals set  History of prediabetes Recent A1Cs at goal by lifestyle  Discussed diet/exercise, weight management  Check A1C annually  OSTEOPENIA Due for DEXA 01/2021-order placed to schedule with mammogram, cont vitamin D, calcium, weight bearing exerces   HYPERLIPIDEMIA  -continue medications, LDL goal <100, check lipids, decrease fatty foods, increase activity.  - TSH - CMP/GFR - Lipid panel   Allergy Avoid triggerd, OTC agent PRN, monitor  Vitamin D deficiency Continue vit D supplement for goal 60-100; check annually and PRN - Vit D  25 hydroxy (rtn osteoporosis monitoring)  Encounter for long-term (current) use of other medications - Magnesium  History of breast cancer Continue annual MGM  Essential hypertension Typically fairly controlled, atypically elevated, lifestyle/DASH reviewed, start checking daily at home and return in 2-4 weeks for recheck Monitor blood pressure at home; call if consistently over 140/80 Continue DASH diet.   Reminder to go to the ER if any CP, SOB, nausea, dizziness, severe HA, changes vision/speech, left arm numbness and tingling and jaw pain. -     CBC with Differential/Platelet -     CMP/GFR -     TSH  Thyroid Cancer,Hx S/p resection remotely;  Continue thyroid replacement   HYPOTHYROIDISM -check TSH level, continue medications the same, reminded to take on an empty stomach 30-85mins before food.  - TSH  Gastroesophageal reflux disease, esophagitis presence not specified Fairly managed off of medications recently  Discussed diet, avoiding triggers and other lifestyle changes  Other irritable bowel syndrome If not on benefiber then add it, decrease stress, avoid triggers  if any worsening symptoms, blood in stool, AB pain, etc call office  COPD By imaging, no  smoking/exposure history, age related Denies sx; monitor  Bil knee arthritis Has seen ortho for injections; declined surgery; recommended tylenol, topical voltaren PRN  B12 Continue supplement; declines recheck today    Orders Placed This Encounter  Procedures   DG Bone Density   CBC with Differential/Platelet   COMPLETE METABOLIC PANEL WITH GFR   Magnesium   Lipid panel   TSH   Hemoglobin A1c   VITAMIN D 25 Hydroxy (Vit-D Deficiency, Fractures)   Microalbumin / creatinine urine ratio   Urinalysis, Routine w reflex microscopic   EKG 12-Lead     Future Appointments  Date Time Provider [place]  06/11/2021 10:30 AM Magda Bernheim, NP [facility] None  01/16/2022 10:00 AM Liane Comber, NP [facility] None    Plan:   During the course of the visit the patient was educated and counseled about appropriate screening and preventive services including:   Pneumococcal vaccine  Prevnar 13 Influenza vaccine Td vaccine Screening electrocardiogram Bone densitometry screening Colorectal cancer screening Diabetes screening Glaucoma screening Nutrition counseling  Advanced directives: requested   Subjective:   Haley Shah is a 80 y.o. female who presents for CPE and follow up. She has Hypothyroidism; Hyperlipidemia; GERD; IBS; Vertigo; Thyroid Cancer,Hx; History of left breast cancer; Other abnormal glucose (hx of prediabetes); Vitamin D deficiency; Medication management; Essential hypertension; COPD (chronic obstructive pulmonary disease) ([place]); Primary osteoarthritis of both knees; B12 deficiency; and Osteopenia on their problem list.  She is married, 2 children, 5 grandchilren, 1 great granchild, all local. 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   Has history of left breast cancer in 2009, continues with annual mammograms, had negative in 01/2020.  She has COPD/hyperinflation by imaging on CXR since 2010; denies history of smoking, denies  dyspnea, cough, secretions.   She has bil knee arthritis, has seen ortho for injections, declined surgery. Does have some pain, takes tylenol PRN if will be walking a lot but prefers to avoid medication, will use topicals occasional.   She has benign essential tremor, mild, worse in AM, declines medication.   BMI is Body mass index is 23.59 kg/m., she has been working on diet, no intentional exercise due to knees, but very active in home and yard.  Pushes water, 64 fluid ounces most days Wt Readings from Last 3 Encounters:  01/16/21 120 lb 12.8 oz (54.8 kg)  06/08/20 122 lb (55.3 kg)  01/17/20 123 lb (55.8 kg)   BP typically well conrolled at home, admits hasn't been checking recently, today their BP is BP: (!) 164/80, remains elevated on manyal recheck She does not workout. She denies chest pain, shortness of breath, dizziness.    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 Avoids NSAIDs and pushing water getting 65 fluid ounces daily  Lab Results  Component Value Date   GFRNONAA 71 06/08/2020   She is on cholesterol medication, rosuvastatin 40 mg tab 5 days a week.Her cholesterol is at goal. The cholesterol last visit was:   Lab Results  Component Value Date   CHOL 145 06/08/2020   HDL 59 06/08/2020   LDLCALC 67 06/08/2020   TRIG 106 06/08/2020   CHOLHDL 2.5 06/08/2020     Last A1C in the office was:  Lab Results  Component Value Date   HGBA1C 5.5 01/17/2020   Patient is on Vitamin D supplement. Lab Results  Component Value Date   VD25OH 91 01/17/2020   She is on thyroid medication following partial thyroidectomy for thyroid tumor. Her medication was not changed last visit, she is on 1/2 pill rest of the week and 1 pill M,W,F.  Patient denies nervousness and palpitations.  Lab Results  Component Value Date   TSH 0.76 06/08/2020   She reports did start on PO B12, not SL, not sure of levels.  Lab Results  Component Value Date   OVFIEPPI95 188 06/08/2020     Medication  Review Current Outpatient Medications on File Prior to Visit  Medication Sig Dispense Refill   aspirin 81 MG tablet Take 81 mg by mouth daily.     Cyanocobalamin (B-12 PO) Take by mouth daily.     latanoprost (XALATAN) 0.005 % ophthalmic solution Place 1 drop into both eyes at bedtime.     levothyroxine (SYNTHROID) 100 MCG tablet TAKE 1 TABLET BY MOUTH IN THE MORNING ON AN EMPTY STOMACH WITH ONLY WATER FOR 30 MINUTES AND NO ANTACID MEDICATIONS FOR 4 HOURS 90 tablet 1   OVER THE COUNTER MEDICATION OTC Fiber Gummies  OR   Benefiber.     rosuvastatin (CRESTOR) 40 MG tablet TAKE 1 TABLET BY MOUTH 5 DAYS PER WEEK OR AS DIRECTED (SKIP TUESDAY AND SATURDAY) FOR CHOLESTEROL 60 tablet 3   VITAMIN D, CHOLECALCIFEROL, PO Take 2,000 Int'l Units by mouth.     mupirocin ointment (BACTROBAN) 2 % Apply 1 application topically 2 (two) times daily. (Patient not taking: Reported on 01/16/2021) 30 g 0   No current facility-administered medications on file prior to visit.    Current Problems (verified) Patient Active Problem List   Diagnosis Date Noted   Osteopenia 01/16/2021   B12 deficiency 01/18/2020   Primary osteoarthritis of both knees 04/08/2019   COPD (chronic  obstructive pulmonary disease) ([place]) 12/14/2017   Essential hypertension 10/27/2013   Medication management 04/23/2013   Other abnormal glucose (hx of prediabetes)    Vitamin D deficiency    History of left breast cancer 11/03/2012   Hypothyroidism 03/22/2008   Hyperlipidemia 03/22/2008   GERD 03/22/2008   IBS 03/22/2008   Vertigo 03/22/2008   Thyroid Cancer,Hx 03/22/2008    Screening Tests Immunization History  Administered Date(s) Administered   Influenza, High Dose Seasonal PF 01/05/2013, 01/05/2015, 11/27/2015, 12/20/2016, 12/15/2017, 12/17/2018, 01/17/2020, 01/01/2021   PFIZER(Purple Top)SARS-COV-2 Vaccination 04/25/2019, 05/19/2019, 03/13/2020   Pneumococcal Conjugate-13 03/02/2015   Pneumococcal Polysaccharide-23 03/18/2008    Td 03/18/2005   Tdap 12/01/2018    Preventative care: Last colonoscopy: 2011 due 10 years, declines - will consider but not at this time; discussed risk of colon cancer, she does have excellent life expectancy, negative cologuard 07/18/2020  Last mammogram: 01/2020 has scheduled later this month  Last pap smear/pelvic exam: 2012 declines another DEXA: 01/2019, osteopenia, spine T -2.1 due 01/2021  Echo 2010 CXR 04/2019   Prior vaccinations: TD or Tdap: 2020 Influenza: 12/2020 Pneumococcal: 2010 Prevnar 13: 2016 Shingles/Zostavax: declines Covid 19: 2/2, 2021, pfizer last booster   Names of Other Physician/Practitioners you currently use: 1. [facility]- here for primary care 2. Dr. Katy Fitch, eye doctor, last 2022 3.  Dr. Buelah Manis, dentist, last visit 2022, has scheduled next month  4. Dr. Danella Sensing, derm, last visit 2022, goes annually for full body checks  Patient Care Team: Unk Pinto, MD as PCP - General (Internal Medicine)   Allergies Allergies  Allergen Reactions   Naprosyn [Naproxen]     Raw mouth   Neomycin-Bacitracin Zn-Polymyx     REACTION: rash   Propoxyphene Hcl     REACTION: syncope   Darvon Other (See Comments)    Almost fainted when taking, over 50 years.    SURGICAL HISTORY She  has a past surgical history that includes Thyroid surgery (1986); Breast lumpectomy (2010); Colonoscopy; Knee arthroscopy with medial menisectomy (Left, 05/25/2014); Knee arthroscopy with lateral menisectomy (Left, 05/25/2014); Chondroplasty (Left, 05/25/2014); Rotator cuff repair (Right, 05/27/2018); Cataract extraction, bilateral (Bilateral); and Cardiac catheterization (2006). FAMILY HISTORY Her family history includes Cancer in her father; Colon cancer in her father; Kidney disease in her paternal grandmother; Ovarian cancer in her sister; Stroke in her father and paternal grandfather. SOCIAL HISTORY She  reports that she has never  smoked. She has never used smokeless tobacco. She reports that she does not drink alcohol and does not use drugs.   Review of Systems  Constitutional:  Negative for malaise/fatigue and weight loss.  HENT:  Negative for hearing loss and tinnitus.   Eyes:  Negative for blurred vision and double vision.  Respiratory:  Negative for cough, sputum production, shortness of breath and wheezing.   Cardiovascular:  Negative for chest pain, palpitations, orthopnea, claudication, leg swelling and PND.  Gastrointestinal:  Negative for abdominal pain, blood in stool, constipation, diarrhea, heartburn, melena, nausea and vomiting.  Genitourinary: Negative.   Musculoskeletal:  Positive for joint pain (knees, ortho follows). Negative for falls and myalgias.  Skin:  Negative for rash.  Neurological:  Negative for dizziness, tingling, sensory change, weakness and headaches.  Endo/Heme/Allergies:  Positive for environmental allergies. Negative for polydipsia.  Psychiatric/Behavioral: Negative.  Negative for depression, memory loss, substance abuse and suicidal ideas. The patient is not nervous/anxious and does not have insomnia.   All other systems reviewed and are negative.   Objective:  Blood pressure (!) 164/80, pulse 62, temperature (!) 97.5 F (36.4 C), height 5' (1.524 m), weight 120 lb 12.8 oz (54.8 kg), SpO2 99 %. Body mass index is 23.59 kg/m.  General appearance: alert, no distress, WD/WN,  female HEENT: normocephalic, sclerae anicteric, TMs pearly, nares patent, no discharge or erythema, pharynx normal Oral cavity: MMM, no lesions Neck: supple, no lymphadenopathy, no thyromegaly, no masses Heart: RRR, normal S1, S2, no murmurs Lungs: CTA bilaterally, no wheezes, rhonchi, or rales Abdomen: +bs, soft, non tender, non distended, no masses, no hepatomegaly, no splenomegaly Musculoskeletal: nontender, no swelling, no obvious deformity. Patient is able to ambulate well.  Extremities: no edema, no  cyanosis, no clubbing Pulses: 2+ symmetric, upper and lower extremities, normal cap refill Neurological: alert, oriented x 3, CN2-12 intact, strength normal upper extremities and lower extremities, sensation normal throughout, DTRs 2+ throughout, no cerebellar signs, gait normal Psychiatric: normal affect, behavior normal, pleasant  Skin: warm, dry, intact; no concerning rash, lesions;  Breasts: patient declines, has mammogram scheduled GU: declines, no concerns  EKG: sinus bradycardia, IRBBB    Izora Ribas, NP   01/16/2021

## 2021-01-16 ENCOUNTER — Encounter: Payer: Self-pay | Admitting: Adult Health

## 2021-01-16 ENCOUNTER — Ambulatory Visit (INDEPENDENT_AMBULATORY_CARE_PROVIDER_SITE_OTHER): Payer: PPO | Admitting: Adult Health

## 2021-01-16 ENCOUNTER — Other Ambulatory Visit: Payer: Self-pay

## 2021-01-16 VITALS — BP 164/80 | HR 62 | Temp 97.5°F | Ht 60.0 in | Wt 120.8 lb

## 2021-01-16 DIAGNOSIS — Z0000 Encounter for general adult medical examination without abnormal findings: Secondary | ICD-10-CM | POA: Diagnosis not present

## 2021-01-16 DIAGNOSIS — Z136 Encounter for screening for cardiovascular disorders: Secondary | ICD-10-CM | POA: Diagnosis not present

## 2021-01-16 DIAGNOSIS — Z131 Encounter for screening for diabetes mellitus: Secondary | ICD-10-CM

## 2021-01-16 DIAGNOSIS — Z853 Personal history of malignant neoplasm of breast: Secondary | ICD-10-CM

## 2021-01-16 DIAGNOSIS — I10 Essential (primary) hypertension: Secondary | ICD-10-CM | POA: Diagnosis not present

## 2021-01-16 DIAGNOSIS — Z79899 Other long term (current) drug therapy: Secondary | ICD-10-CM

## 2021-01-16 DIAGNOSIS — R7309 Other abnormal glucose: Secondary | ICD-10-CM | POA: Diagnosis not present

## 2021-01-16 DIAGNOSIS — Z85850 Personal history of malignant neoplasm of thyroid: Secondary | ICD-10-CM

## 2021-01-16 DIAGNOSIS — Z0001 Encounter for general adult medical examination with abnormal findings: Secondary | ICD-10-CM

## 2021-01-16 DIAGNOSIS — E559 Vitamin D deficiency, unspecified: Secondary | ICD-10-CM | POA: Diagnosis not present

## 2021-01-16 DIAGNOSIS — K219 Gastro-esophageal reflux disease without esophagitis: Secondary | ICD-10-CM

## 2021-01-16 DIAGNOSIS — E782 Mixed hyperlipidemia: Secondary | ICD-10-CM

## 2021-01-16 DIAGNOSIS — Z1389 Encounter for screening for other disorder: Secondary | ICD-10-CM | POA: Diagnosis not present

## 2021-01-16 DIAGNOSIS — E538 Deficiency of other specified B group vitamins: Secondary | ICD-10-CM

## 2021-01-16 DIAGNOSIS — M8580 Other specified disorders of bone density and structure, unspecified site: Secondary | ICD-10-CM | POA: Insufficient documentation

## 2021-01-16 DIAGNOSIS — E039 Hypothyroidism, unspecified: Secondary | ICD-10-CM | POA: Diagnosis not present

## 2021-01-16 DIAGNOSIS — J449 Chronic obstructive pulmonary disease, unspecified: Secondary | ICD-10-CM

## 2021-01-16 DIAGNOSIS — M170 Bilateral primary osteoarthritis of knee: Secondary | ICD-10-CM

## 2021-01-16 NOTE — Patient Instructions (Addendum)
Ms. Placide , Thank you for taking time to come for your Annual Wellness Visit. I appreciate your ongoing commitment to your health goals. Please review the following plan we discussed and let me know if I can assist you in the future.   These are the goals we discussed:  Goals      Blood Pressure < 140/80     Exercise - resistance exercises/weights every other day        This is a list of the screening recommended for you and due dates:  Health Maintenance  Topic Date Due   COVID-19 Vaccine (4 - Booster for Pfizer series) 05/08/2020   Urine Protein Check  01/16/2021   Zoster (Shingles) Vaccine (1 of 2) 04/18/2021*   Mammogram  02/01/2021   Tetanus Vaccine  11/30/2028   Pneumonia Vaccine  Completed   Flu Shot  Completed   DEXA scan (bone density measurement)  Completed   HPV Vaccine  Aged Out  *Topic was postponed. The date shown is not the original due date.    Please send covid 19 booster date  Suggest wide shoes, tailor's bunion pads  Please review information below and monitor blood pressure at home with log    Know what a healthy weight is for you (roughly BMI <25) and aim to maintain this  Aim for 7+ servings of fruits and vegetables daily  65-80+ fluid ounces of water or unsweet tea for healthy kidneys  Limit to max 1 drink of alcohol per day; avoid smoking/tobacco  Limit animal fats in diet for cholesterol and heart health - choose grass fed whenever available  Avoid highly processed foods, and foods high in saturated/trans fats  Aim for low stress - take time to unwind and care for your mental health  Aim for 150 min of moderate intensity exercise weekly for heart health, and weights twice weekly for bone health  Aim for 7-9 hours of sleep daily     HYPERTENSION INFORMATION  Monitor your blood pressure at home, please keep a record and bring that in with you to your next office visit.   Go to the ER if any CP, SOB, nausea, dizziness, severe HA,  changes vision/speech  Testing/Procedures: HOW TO TAKE YOUR BLOOD PRESSURE: Rest 5 minutes before taking your blood pressure. Don't smoke or drink caffeinated beverages for at least 30 minutes before. Take your blood pressure before (not after) you eat. Sit comfortably with your back supported and both feet on the floor (don't cross your legs). Elevate your arm to heart level on a table or a desk. Use the proper sized cuff. It should fit smoothly and snugly around your bare upper arm. There should be enough room to slip a fingertip under the cuff. The bottom edge of the cuff should be 1 inch above the crease of the elbow.   Take your medications faithfully as instructed. Maintain a healthy weight. Get at least 150 minutes of aerobic exercise per week. Minimize salt intake. Minimize alcohol intake  DASH Eating Plan DASH stands for "Dietary Approaches to Stop Hypertension." The DASH eating plan is a healthy eating plan that has been shown to reduce high blood pressure (hypertension). Additional health benefits may include reducing the risk of type 2 diabetes mellitus, heart disease, and stroke. The DASH eating plan may also help with weight loss. WHAT DO I NEED TO KNOW ABOUT THE DASH EATING PLAN? For the DASH eating plan, you will follow these general guidelines: Choose foods with a percent  daily value for sodium of less than 5% (as listed on the food label). Use salt-free seasonings or herbs instead of table salt or sea salt. Check with your health care provider or pharmacist before using salt substitutes. Eat lower-sodium products, often labeled as "lower sodium" or "no salt added." Eat fresh foods. Eat more vegetables, fruits, and low-fat dairy products. Choose whole grains. Look for the word "whole" as the first word in the ingredient list. Choose fish and skinless chicken or [place] more often than red meat. Limit fish, poultry, and meat to 6 oz (170 g) each day. Limit sweets,  desserts, sugars, and sugary drinks. Choose heart-healthy fats. Limit cheese to 1 oz (28 g) per day. Eat more home-cooked food and less restaurant, buffet, and fast food. Limit fried foods. Cook foods using methods other than frying. Limit canned vegetables. If you do use them, rinse them well to decrease the sodium. When eating at a restaurant, ask that your food be prepared with less salt, or no salt if possible. WHAT FOODS CAN I EAT? Seek help from a dietitian for individual calorie needs. Grains Whole grain or whole wheat bread. Brown rice. Whole grain or whole wheat pasta. Quinoa, bulgur, and whole grain cereals. Low-sodium cereals. Corn or whole wheat flour tortillas. Whole grain cornbread. Whole grain crackers. Low-sodium crackers. Vegetables Fresh or frozen vegetables (raw, steamed, roasted, or grilled). Low-sodium or reduced-sodium tomato and vegetable juices. Low-sodium or reduced-sodium tomato sauce and paste. Low-sodium or reduced-sodium canned vegetables.  Fruits All fresh, canned (in natural juice), or frozen fruits. Meat and Other Protein Products Ground beef (85% or leaner), grass-fed beef, or beef trimmed of fat. Skinless chicken or [place]. Ground chicken or [place]. Pork trimmed of fat. All fish and seafood. Eggs. Dried beans, peas, or lentils. Unsalted nuts and seeds. Unsalted canned beans. Dairy Low-fat dairy products, such as skim or 1% milk, 2% or reduced-fat cheeses, low-fat ricotta or cottage cheese, or plain low-fat yogurt. Low-sodium or reduced-sodium cheeses. Fats and Oils Tub margarines without trans fats. Light or reduced-fat mayonnaise and salad dressings (reduced sodium). Avocado. Safflower, olive, or canola oils. Natural peanut or almond butter. Other Unsalted popcorn and pretzels. The items listed above may not be a complete list of recommended foods or beverages. Contact your dietitian for more options. WHAT FOODS ARE NOT RECOMMENDED? Grains White bread.  White pasta. White rice. Refined cornbread. Bagels and croissants. Crackers that contain trans fat. Vegetables Creamed or fried vegetables. Vegetables in a cheese sauce. Regular canned vegetables. Regular canned tomato sauce and paste. Regular tomato and vegetable juices. Fruits Dried fruits. Canned fruit in light or heavy syrup. Fruit juice. Meat and Other Protein Products Fatty cuts of meat. Ribs, chicken wings, bacon, sausage, bologna, salami, chitterlings, fatback, hot dogs, bratwurst, and packaged luncheon meats. Salted nuts and seeds. Canned beans with salt. Dairy Whole or 2% milk, cream, half-and-half, and cream cheese. Whole-fat or sweetened yogurt. Full-fat cheeses or blue cheese. Nondairy creamers and whipped toppings. Processed cheese, cheese spreads, or cheese curds. Condiments Onion and garlic salt, seasoned salt, table salt, and sea salt. Canned and packaged gravies. Worcestershire sauce. Tartar sauce. Barbecue sauce. Teriyaki sauce. Soy sauce, including reduced sodium. Steak sauce. Fish sauce. Oyster sauce. Cocktail sauce. Horseradish. Ketchup and mustard. Meat flavorings and tenderizers. Bouillon cubes. Hot sauce. Tabasco sauce. Marinades. Taco seasonings. Relishes. Fats and Oils Butter, stick margarine, lard, shortening, ghee, and bacon fat. Coconut, palm kernel, or palm oils. Regular salad dressings. Other Pickles and olives. Salted popcorn and  pretzels. The items listed above may not be a complete list of foods and beverages to avoid. Contact your dietitian for more information. WHERE CAN I FIND MORE INFORMATION? National Heart, Lung, and Blood Institute: travelstabloid.com Document Released: 02/21/2011 Document Revised: 07/19/2013 Document Reviewed: 01/06/2013 [facility] Patient Information 2015 [place], [place]. This information is not intended to replace advice given to you by your health care provider. Make sure you discuss any questions you  have with your health care provider.

## 2021-01-18 LAB — CBC WITH DIFFERENTIAL/PLATELET
Absolute Monocytes: 983 cells/uL — ABNORMAL HIGH (ref 200–950)
Basophils Absolute: 78 cells/uL (ref 0–200)
Basophils Relative: 0.9 %
Eosinophils Absolute: 148 cells/uL (ref 15–500)
Eosinophils Relative: 1.7 %
HCT: 42.1 % (ref 35.0–45.0)
Hemoglobin: 13.8 g/dL (ref 11.7–15.5)
Lymphs Abs: 2349 cells/uL (ref 850–3900)
MCH: 29.8 pg (ref 27.0–33.0)
MCHC: 32.8 g/dL (ref 32.0–36.0)
MCV: 90.9 fL (ref 80.0–100.0)
MPV: 9.9 fL (ref 7.5–12.5)
Monocytes Relative: 11.3 %
Neutro Abs: 5142 cells/uL (ref 1500–7800)
Neutrophils Relative %: 59.1 %
Platelets: 260 10*3/uL (ref 140–400)
RBC: 4.63 10*6/uL (ref 3.80–5.10)
RDW: 13 % (ref 11.0–15.0)
Total Lymphocyte: 27 %
WBC: 8.7 10*3/uL (ref 3.8–10.8)

## 2021-01-18 LAB — TSH: TSH: 0.82 mIU/L (ref 0.40–4.50)

## 2021-01-18 LAB — COMPLETE METABOLIC PANEL WITH GFR
AG Ratio: 1.7 (calc) (ref 1.0–2.5)
ALT: 8 U/L (ref 6–29)
AST: 15 U/L (ref 10–35)
Albumin: 4.5 g/dL (ref 3.6–5.1)
Alkaline phosphatase (APISO): 63 U/L (ref 37–153)
BUN: 15 mg/dL (ref 7–25)
CO2: 25 mmol/L (ref 20–32)
Calcium: 9.7 mg/dL (ref 8.6–10.4)
Chloride: 104 mmol/L (ref 98–110)
Creat: 0.91 mg/dL (ref 0.60–0.95)
Globulin: 2.6 g/dL (calc) (ref 1.9–3.7)
Glucose, Bld: 82 mg/dL (ref 65–99)
Potassium: 4.3 mmol/L (ref 3.5–5.3)
Sodium: 141 mmol/L (ref 135–146)
Total Bilirubin: 0.5 mg/dL (ref 0.2–1.2)
Total Protein: 7.1 g/dL (ref 6.1–8.1)
eGFR: 63 mL/min/{1.73_m2} (ref >=60)

## 2021-01-18 LAB — URINALYSIS, ROUTINE W REFLEX MICROSCOPIC
Bacteria, UA: NONE SEEN /HPF
Bilirubin Urine: NEGATIVE
Glucose, UA: NEGATIVE
Hyaline Cast: NONE SEEN /LPF
Ketones, ur: NEGATIVE
Leukocytes,Ua: NEGATIVE
Nitrite: NEGATIVE
Protein, ur: NEGATIVE
RBC / HPF: NONE SEEN /HPF (ref 0–2)
Specific Gravity, Urine: 1.007 (ref 1.001–1.035)
Squamous Epithelial / HPF: NONE SEEN /HPF (ref <=5)
WBC, UA: NONE SEEN /HPF (ref 0–5)
pH: 5.5 (ref 5.0–8.0)

## 2021-01-18 LAB — MICROALBUMIN / CREATININE URINE RATIO
Creatinine, Urine: 30 mg/dL (ref 20–275)
Microalb Creat Ratio: 7 mcg/mg creat (ref <30)
Microalb, Ur: 0.2 mg/dL

## 2021-01-18 LAB — LIPID PANEL
Cholesterol: 156 mg/dL (ref <200)
HDL: 60 mg/dL (ref >=50)
LDL Cholesterol (Calc): 75 mg/dL (calc)
Non-HDL Cholesterol (Calc): 96 mg/dL (calc) (ref <130)
Total CHOL/HDL Ratio: 2.6 (calc) (ref <5.0)
Triglycerides: 133 mg/dL (ref <150)

## 2021-01-18 LAB — HEMOGLOBIN A1C
Hgb A1c MFr Bld: 5.2 % of total Hgb (ref <5.7)
Mean Plasma Glucose: 103 mg/dL
eAG (mmol/L): 5.7 mmol/L

## 2021-01-18 LAB — MICROSCOPIC MESSAGE

## 2021-01-18 LAB — VITAMIN D 25 HYDROXY (VIT D DEFICIENCY, FRACTURES): Vit D, 25-Hydroxy: 70 ng/mL (ref 30–100)

## 2021-01-18 LAB — MAGNESIUM: Magnesium: 1.9 mg/dL (ref 1.5–2.5)

## 2021-02-14 ENCOUNTER — Other Ambulatory Visit: Payer: Self-pay

## 2021-02-14 ENCOUNTER — Ambulatory Visit: Payer: PPO | Admitting: Nurse Practitioner

## 2021-02-14 ENCOUNTER — Ambulatory Visit (INDEPENDENT_AMBULATORY_CARE_PROVIDER_SITE_OTHER): Payer: PPO | Admitting: Internal Medicine

## 2021-02-14 ENCOUNTER — Ambulatory Visit: Payer: PPO | Admitting: Internal Medicine

## 2021-02-14 ENCOUNTER — Encounter: Payer: Self-pay | Admitting: Internal Medicine

## 2021-02-14 VITALS — BP 134/76 | HR 80 | Temp 97.9°F | Resp 16 | Ht 60.0 in | Wt 120.8 lb

## 2021-02-14 DIAGNOSIS — Z780 Asymptomatic menopausal state: Secondary | ICD-10-CM | POA: Diagnosis not present

## 2021-02-14 DIAGNOSIS — R1031 Right lower quadrant pain: Secondary | ICD-10-CM | POA: Diagnosis not present

## 2021-02-14 DIAGNOSIS — Z1231 Encounter for screening mammogram for malignant neoplasm of breast: Secondary | ICD-10-CM | POA: Diagnosis not present

## 2021-02-14 DIAGNOSIS — M8589 Other specified disorders of bone density and structure, multiple sites: Secondary | ICD-10-CM | POA: Diagnosis not present

## 2021-02-14 LAB — CBC WITH DIFFERENTIAL/PLATELET
Absolute Monocytes: 1000 cells/uL — ABNORMAL HIGH (ref 200–950)
Basophils Absolute: 80 cells/uL (ref 0–200)
Basophils Relative: 1.1 %
Eosinophils Absolute: 73 cells/uL (ref 15–500)
Eosinophils Relative: 1 %
HCT: 42.1 % (ref 35.0–45.0)
Hemoglobin: 13.6 g/dL (ref 11.7–15.5)
Lymphs Abs: 2022 cells/uL (ref 850–3900)
MCH: 29.5 pg (ref 27.0–33.0)
MCHC: 32.3 g/dL (ref 32.0–36.0)
MCV: 91.3 fL (ref 80.0–100.0)
MPV: 9.9 fL (ref 7.5–12.5)
Monocytes Relative: 13.7 %
Neutro Abs: 4125 cells/uL (ref 1500–7800)
Neutrophils Relative %: 56.5 %
Platelets: 251 10*3/uL (ref 140–400)
RBC: 4.61 10*6/uL (ref 3.80–5.10)
RDW: 12.8 % (ref 11.0–15.0)
Total Lymphocyte: 27.7 %
WBC: 7.3 10*3/uL (ref 3.8–10.8)

## 2021-02-14 LAB — HM MAMMOGRAPHY

## 2021-02-14 LAB — HM DEXA SCAN

## 2021-02-14 NOTE — Progress Notes (Signed)
Abd pain    Future Appointments  Date Time Provider Department  02/14/2021  1:30 PM Unk Pinto, MD [facility]  06/11/2021 10:30 AM Magda Bernheim, NP [facility]  01/16/2022 10:00 AM Liane Comber, NP [facility]    History of Present Illness:     This very nice 80 yo MWF presents with a 2 week hx/ o intermittent Rt sided abd pains w/o upper GI sx's, N/V, fever, chills, sweats, D/C, BRRB, melena. Denies UT sx's as ur. freq. or dysuria.    Medications    levothyroxine (SYNTHROID) 100 MCG tablet, TAKE 1 TABLET BY MOUTH IN THE MORNING ON AN EMPTY STOMACH WITH ONLY WATER FOR 30 MINUTES AND NO ANTACID MEDICATIONS FOR 4 HOURS   rosuvastatin (CRESTOR) 40 MG tablet, TAKE 1 TABLET BY MOUTH 5 DAYS PER WEEK OR AS DIRECTED (SKIP TUESDAY AND SATURDAY) FOR CHOLESTEROL    aspirin 81 MG tablet, Take 81 mg by mouth daily.  Current Outpatient Medications (Hematological):    Cyanocobalamin (B-12 PO), Take by mouth daily.  Current Outpatient Medications (Other):    latanoprost (XALATAN) 0.005 % ophthalmic solution, Place 1 drop into both eyes at bedtime.   mupirocin ointment (BACTROBAN) 2 %, Apply 1 application topically 2 (two) times daily. (Patient not taking: Reported on 01/16/2021)   OVER THE COUNTER MEDICATION, OTC Fiber Gummies  OR   Benefiber.   VITAMIN D, CHOLECALCIFEROL, PO, Take 2,000 Int'l Units by mouth.  Problem list She has Hypothyroidism; Hyperlipidemia; GERD; IBS; Vertigo; Thyroid Cancer,Hx; History of left breast cancer; Other abnormal glucose (hx of prediabetes); Vitamin D deficiency; Medication management; Essential hypertension; COPD (chronic obstructive pulmonary disease) ([place]); Primary osteoarthritis of both knees; B12 deficiency; and Osteopenia on their problem list.   Observations/Objective:  BP 134/76   Pulse 80   Temp 97.9 F (36.6 C)   Resp 16   Ht 5' (1.524 m)   Wt 120 lb 12.8 oz (54.8 kg)   SpO2 96%   BMI 23.59 kg/m   HEENT - WNL. Neck - supple.  Chest -  Clear equal BS. Cor - Nl HS. RRR w/o sig MGR. PP 1(+). No edema. Abd - Soft with sl tenderness in the RLA > RUA. No guarding or rebound.  MS- FROM w/o deformities.  Gait Nl. Neuro -  Nl w/o focal abnormalities.  Assessment and Plan:  1. Right lower quadrant abdominal pain  - CBC with Differential/Platelet  - CT Abdomen Pelvis Wo Contrast; Future   Follow Up Instructions:        I discussed the assessment and treatment plan with the patient. The patient was provided an opportunity to ask questions and all were answered. The patient agreed with the plan and demonstrated an understanding of the instructions.       The patient was advised to call back or seek an in-person evaluation if the symptoms worsen or if the condition fails to improve as anticipated.    Kirtland Bouchard, MD

## 2021-02-15 ENCOUNTER — Encounter: Payer: Self-pay | Admitting: Internal Medicine

## 2021-02-15 ENCOUNTER — Ambulatory Visit (HOSPITAL_BASED_OUTPATIENT_CLINIC_OR_DEPARTMENT_OTHER)
Admission: RE | Admit: 2021-02-15 | Discharge: 2021-02-15 | Disposition: A | Discharge status: Home / Self Care | Payer: PPO | Source: Ambulatory Visit | Attending: Internal Medicine | Admitting: Internal Medicine

## 2021-02-15 DIAGNOSIS — R1031 Right lower quadrant pain: Secondary | ICD-10-CM | POA: Insufficient documentation

## 2021-02-15 DIAGNOSIS — K5730 Diverticulosis of large intestine without perforation or abscess without bleeding: Secondary | ICD-10-CM | POA: Diagnosis not present

## 2021-02-15 DIAGNOSIS — I700 Atherosclerosis of aorta: Secondary | ICD-10-CM | POA: Diagnosis not present

## 2021-02-15 NOTE — Progress Notes (Signed)
============================================================ -   Test results slightly outside the reference range are not unusual. If there is anything important, I will review this with you,  otherwise it is considered normal test values.  If you have further questions,  please do not hesitate to contact me at the office or via My Chart.  ============================================================ ============================================================  - Great Report !  -  Abdominal / Pelvic  CT scan is Normal   - all internal organs are Normal & no sign of infection  ============================================================ ============================================================

## 2021-02-15 NOTE — Progress Notes (Signed)
============================================================ -   Test results slightly outside the reference range are not unusual. If there is anything important, I will review this with you,  otherwise it is considered normal test values.  If you have further questions,  please do not hesitate to contact me at the office or via My Chart.  ============================================================ ============================================================  -  CBC / WBC  - OK - No sign of Infection  ============================================================ ============================================================

## 2021-02-19 ENCOUNTER — Encounter: Payer: Self-pay | Admitting: Internal Medicine

## 2021-02-20 ENCOUNTER — Other Ambulatory Visit: Payer: Self-pay | Admitting: Internal Medicine

## 2021-02-20 ENCOUNTER — Ambulatory Visit: Payer: PPO | Admitting: Adult Health

## 2021-02-20 ENCOUNTER — Encounter: Payer: Self-pay | Admitting: Internal Medicine

## 2021-02-20 MED ORDER — DICYCLOMINE HCL 20 MG PO TABS: ORAL_TABLET | ORAL | 1 refills | Status: DC

## 2021-04-02 ENCOUNTER — Encounter: Payer: Self-pay | Admitting: Internal Medicine

## 2021-04-12 DIAGNOSIS — H26493 Other secondary cataract, bilateral: Secondary | ICD-10-CM | POA: Diagnosis not present

## 2021-04-12 DIAGNOSIS — H401131 Primary open-angle glaucoma, bilateral, mild stage: Secondary | ICD-10-CM | POA: Diagnosis not present

## 2021-04-12 DIAGNOSIS — H02834 Dermatochalasis of left upper eyelid: Secondary | ICD-10-CM | POA: Diagnosis not present

## 2021-04-12 DIAGNOSIS — H04123 Dry eye syndrome of bilateral lacrimal glands: Secondary | ICD-10-CM | POA: Diagnosis not present

## 2021-04-12 DIAGNOSIS — H1045 Other chronic allergic conjunctivitis: Secondary | ICD-10-CM | POA: Diagnosis not present

## 2021-04-12 DIAGNOSIS — Z961 Presence of intraocular lens: Secondary | ICD-10-CM | POA: Diagnosis not present

## 2021-04-12 DIAGNOSIS — H43813 Vitreous degeneration, bilateral: Secondary | ICD-10-CM | POA: Diagnosis not present

## 2021-04-12 DIAGNOSIS — H02831 Dermatochalasis of right upper eyelid: Secondary | ICD-10-CM | POA: Diagnosis not present

## 2021-04-13 ENCOUNTER — Other Ambulatory Visit: Payer: Self-pay | Admitting: Adult Health

## 2021-04-13 DIAGNOSIS — E039 Hypothyroidism, unspecified: Secondary | ICD-10-CM

## 2021-06-07 NOTE — Progress Notes (Signed)
ANNUAL MEDICARE WELLNESS VISIT  ? ?Assessment:  ? ? ?Encounter for Medicare wWellness visit ?Due 1 year; declines colonoscopy  ? ?History of prediabetes ?Recent A1Cs at goal ?Discussed diet/exercise, weight management  ?Check A1C annually ? ?OSTEOPENIA ?Due for DEXA 01/2021-order placed, cont vitamin D, calcium, weight bearing exerces ? ? HYPERLIPIDEMIA  ?-continue medications, LDL goal <100, check lipids, decrease fatty foods, increase activity.  ?- TSH ?- CMP/GFR ?- Lipid panel ? ? Allergy ?Avoid triggerd, OTC agent PRN, monitor ? ?Vitamin D deficiency ?Continue vit D supplement for goal 60-100; check annually and PRN ?- Vit D  25 hydroxy (rtn osteoporosis monitoring) ? ?Medication [place] ?- Magnesium ? ?History of breast cancer ?Continue MGM ? ?Essential hypertension ?Recently fairly controlled without medicatoins ?Monitor blood pressure at home; call if consistently over 130/80 ?Continue DASH diet.   ?Reminder to go to the ER if any CP, SOB, nausea, dizziness, severe HA, changes vision/speech, left arm numbness and tingling and jaw pain. ?-     CBC with Differential/Platelet ?-     CMP/GFR ?-     TSH ? ?Thyroid Cancer,Hx ?S/p resection remotely;  ?Continue thyroid replacement ? ? HYPOTHYROIDISM ?-check TSH level, continue medications the same, reminded to take on an empty stomach 30-4mns before food.  ?- TSH ? ?Gastroesophageal reflux disease, esophagitis presence not specified ?Fairly managed off of medications recently  ?Discussed diet, avoiding triggers and other lifestyle changes ? ?Other irritable bowel syndrome ?If not on benefiber then add it, decrease stress,  if any worsening symptoms, blood in stool, AB pain, etc call office ? ?COPD ?By imaging, no smoking/exposure history, age related ?Trelegy sample given ?Denies sx; monitor ? ?Bil knee arthritis ?Has seen ortho for injections; declined surgery; recommended tylenol, topical voltaren PRN ? ? ?No orders of the defined types were placed in this  encounter. ? ? ? ?Future Appointments  ?Date Time Provider [place] ?01/16/2022 10:00 AM CLiane Comber NP [facility] None  ?06/10/2022 11:00 AM MMagda Bernheim NP [facility] None  ? ? ? ?Subjective:  ? ?Haley ROSELLIis a 81y.o. female who presents for AWV and 3 month follow up. She has Hypothyroidism; Hyperlipidemia; GERD; IBS; Vertigo; Thyroid Cancer,Hx; History of left breast cancer; Other abnormal glucose (hx of prediabetes); Vitamin D deficiency; Medication management; Essential hypertension; COPD (chronic obstructive pulmonary disease) ([place]; Primary osteoarthritis of both knees; B12 deficiency; and Osteopenia on their problem list. ? ?She is married, 2 children, 5 grandchilren, 1 great granchild, all local. She is retired mSales executive Volunteers at [employer]  ? ?Has history of left breast cancer in 2009, continues with annual mammograms, had negative in 01/2020.  ? ?She has COPD/hyperinflation by imaging on CXR since 2010; denies history of smoking, denies dyspnea, cough, secretions.  ? ?She has bil knee arthritis, has seen ortho for injections, declined surgery. Does have some pain, takes tylenol PRN if will be walking a lot but prefers to avoid medication, will use topicals occasional.  ? ?She has benign essential tremor, mild, worse in AM, declines medication.  ? ?BMI is Body mass index is 23.87 kg/m?., she has been working on diet, admits no intentional exercise due to knees, but very active in home and yard.  ?Pushes water, 64 fluid ounces most days ?Wt Readings from Last 3 Encounters:  ?06/11/21 122 lb 3.2 oz (55.4 kg)  ?02/14/21 120 lb 12.8 oz (54.8 kg)  ?01/16/21 120 lb 12.8 oz (54.8 kg)  ? ?Her blood pressure has been controlled at  home, today their BP is BP: 140/78  ?BP Readings from Last 3 Encounters:  ?06/11/21 140/78  ?02/14/21 134/76  ?01/16/21 (!) 164/80  ?  ?She does not workout. She denies chest pain, shortness of breath, dizziness.   ? 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 Avoids  NSAIDs and pushing water getting 65 fluid ounces daily  ?Lab Results  ?Component Value Date  ? GFRNONAA 71 06/08/2020  ? ?She is on cholesterol medication, rosuvastatin 40 mg tab 5 days a week.Her cholesterol is at goal. The cholesterol last visit was:   ?Lab Results  ?Component Value Date  ? CHOL 156 01/16/2021  ? HDL 60 01/16/2021  ? [place] 75 01/16/2021  ? TRIG 133 01/16/2021  ? CHOLHDL 2.6 01/16/2021  ?  ? Last A1C in the office was:  ?Lab Results  ?Component Value Date  ? HGBA1C 5.2 01/16/2021  ? ?Patient is on Vitamin D supplement. ?Lab Results  ?Component Value Date  ? VD25OH 70 01/16/2021  ? ?She is on thyroid medication following partial thyroidectomy for thyroid tumor. Her medication was not changed last visit, she is on 1/2 pill rest of the week and 1 pill M,W,F.  Patient denies nervousness and palpitations.  ?Lab Results  ?Component Value Date  ? TSH 0.82 01/16/2021  ? ?She reports did start on PO B12, not SL, not sure of levels.  ?Lab Results  ?Component Value Date  ? [id number] 561 06/08/2020  ? ? ? ?Medication Review ?Current Outpatient Medications on File Prior to Visit  ?Medication Sig Dispense Refill  ? aspirin 81 MG tablet Take 81 mg by mouth daily.    ? Cyanocobalamin (B-12 PO) Take by mouth daily. PRN    ? dicyclomine (BENTYL) 20 MG tablet Take  1/2 to 1 tablet  3 to 4 x /day as needed for Abdominal cramping. (Patient taking differently: Take  1/2 to 1 tablet  3 to 4 x /day as needed for Abdominal cramping. PRN) 90 tablet 1  ? latanoprost (XALATAN) 0.005 % ophthalmic solution Place 1 drop into both eyes at bedtime.    ? levothyroxine (SYNTHROID) 100 MCG tablet TAKE 1 TABLET BY MOUTH IN THE MORNING ON AN EMPTY STOMACH WITH ONLY WATER FOR 30 MINUTES AND NO ANTACID MEDICATIONS FOR 4 HOURS 90 tablet 0  ? OVER THE COUNTER MEDICATION OTC Fiber Gummies  OR   Benefiber.    ? rosuvastatin (CRESTOR) 40 MG tablet TAKE 1 TABLET BY MOUTH 5 DAYS PER WEEK OR AS DIRECTED (SKIP TUESDAY AND SATURDAY) FOR  CHOLESTEROL 60 tablet 3  ? VITAMIN D, CHOLECALCIFEROL, PO Take 2,000 Int'l Units by mouth.    ? ?No current facility-administered medications on file prior to visit.  ? ? ?Current Problems (verified) ?Patient Active Problem List  ? Diagnosis Date Noted  ? Osteopenia 01/16/2021  ? B12 deficiency 01/18/2020  ? Primary osteoarthritis of both knees 04/08/2019  ? COPD (chronic obstructive pulmonary disease) ([place]) 12/14/2017  ? Essential hypertension 10/27/2013  ? Medication management 04/23/2013  ? Other abnormal glucose (hx of prediabetes)   ? Vitamin D deficiency   ? History of left breast cancer 11/03/2012  ? Hypothyroidism 03/22/2008  ? Hyperlipidemia 03/22/2008  ? GERD 03/22/2008  ? IBS 03/22/2008  ? Vertigo 03/22/2008  ? Thyroid Cancer,Hx 03/22/2008  ? ? ?Screening Tests ?Immunization History  ?Administered Date(s) Administered  ? Influenza, High Dose Seasonal PF 01/05/2013, 01/05/2015, 11/27/2015, 12/20/2016, 12/15/2017, 12/17/2018, 01/17/2020, 01/01/2021  ? PFIZER(Purple Top)SARS-COV-2 Vaccination 04/25/2019, 05/19/2019, 03/13/2020  ? Pneumococcal Conjugate-13 03/02/2015  ?  Pneumococcal Polysaccharide-23 03/18/2008  ? Td 03/18/2005  ? Tdap 12/01/2018  ? ? ?Preventative care: ?Last colonoscopy: 2011 due 10 years, Cologuard 07/2020 Negative ?Last mammogram: 01/2021 ?Last pap smear/pelvic exam: 2012 declines another ?DEXA: 01/2021, osteopenia, spine T -2.0 osteopenia ? ?Echo 2010 ?CXR 04/2019  ? ?Prior vaccinations: ?TD or Tdap: 2020 ?Influenza: 12/2020 ?Pneumococcal: 2010 ?Prevnar 13: 2016 ?Shingles/Zostavax: declines ?Covid 19: 3/3, 2021, pfizer ? ?Names of Other Physician/Practitioners you currently use: ?1. [facility]- here for primary care ?2. Dr. Katy Fitch, eye doctor, 03/2021 ?3.  Dr. Buelah Manis, dentist, last visit 05/2021 ?4. Dr. Danella Sensing, derm, last visit 2022, goes annually for full body checks ? ?Patient Care Team: ?Unk Pinto, MD as PCP - General (Internal  Medicine) ? ? ?Allergies ?Allergies  ?Allergen Reactions  ? Naprosyn [Naproxen]   ?  Raw mouth  ? Neomycin-Bacitracin Zn-Polymyx   ?  REACTION: rash  ? Propoxyphene Hcl   ?  REACTION: syncope  ? [place] Other (See Co

## 2021-06-11 ENCOUNTER — Other Ambulatory Visit: Payer: Self-pay

## 2021-06-11 ENCOUNTER — Encounter: Payer: Self-pay | Admitting: Nurse Practitioner

## 2021-06-11 ENCOUNTER — Ambulatory Visit (INDEPENDENT_AMBULATORY_CARE_PROVIDER_SITE_OTHER): Payer: PPO | Admitting: Nurse Practitioner

## 2021-06-11 VITALS — BP 140/78 | HR 67 | Temp 97.5°F | Wt 122.2 lb

## 2021-06-11 DIAGNOSIS — E782 Mixed hyperlipidemia: Secondary | ICD-10-CM

## 2021-06-11 DIAGNOSIS — M170 Bilateral primary osteoarthritis of knee: Secondary | ICD-10-CM | POA: Diagnosis not present

## 2021-06-11 DIAGNOSIS — M8580 Other specified disorders of bone density and structure, unspecified site: Secondary | ICD-10-CM

## 2021-06-11 DIAGNOSIS — I10 Essential (primary) hypertension: Secondary | ICD-10-CM

## 2021-06-11 DIAGNOSIS — K219 Gastro-esophageal reflux disease without esophagitis: Secondary | ICD-10-CM

## 2021-06-11 DIAGNOSIS — Z853 Personal history of malignant neoplasm of breast: Secondary | ICD-10-CM

## 2021-06-11 DIAGNOSIS — K588 Other irritable bowel syndrome: Secondary | ICD-10-CM

## 2021-06-11 DIAGNOSIS — Z0001 Encounter for general adult medical examination with abnormal findings: Secondary | ICD-10-CM

## 2021-06-11 DIAGNOSIS — Z79899 Other long term (current) drug therapy: Secondary | ICD-10-CM | POA: Diagnosis not present

## 2021-06-11 DIAGNOSIS — Z85850 Personal history of malignant neoplasm of thyroid: Secondary | ICD-10-CM | POA: Diagnosis not present

## 2021-06-11 DIAGNOSIS — E559 Vitamin D deficiency, unspecified: Secondary | ICD-10-CM | POA: Diagnosis not present

## 2021-06-11 DIAGNOSIS — J302 Other seasonal allergic rhinitis: Secondary | ICD-10-CM | POA: Diagnosis not present

## 2021-06-11 DIAGNOSIS — Z0000 Encounter for general adult medical examination without abnormal findings: Secondary | ICD-10-CM

## 2021-06-11 DIAGNOSIS — R7309 Other abnormal glucose: Secondary | ICD-10-CM

## 2021-06-11 DIAGNOSIS — R6889 Other general symptoms and signs: Secondary | ICD-10-CM

## 2021-06-11 DIAGNOSIS — E039 Hypothyroidism, unspecified: Secondary | ICD-10-CM | POA: Diagnosis not present

## 2021-06-11 DIAGNOSIS — J449 Chronic obstructive pulmonary disease, unspecified: Secondary | ICD-10-CM

## 2021-06-12 LAB — COMPLETE METABOLIC PANEL WITH GFR
AG Ratio: 1.8 (calc) (ref 1.0–2.5)
ALT: 9 U/L (ref 6–29)
AST: 17 U/L (ref 10–35)
Albumin: 4.7 g/dL (ref 3.6–5.1)
Alkaline phosphatase (APISO): 61 U/L (ref 37–153)
BUN: 12 mg/dL (ref 7–25)
CO2: 28 mmol/L (ref 20–32)
Calcium: 9.5 mg/dL (ref 8.6–10.4)
Chloride: 103 mmol/L (ref 98–110)
Creat: 0.81 mg/dL (ref 0.60–0.95)
Globulin: 2.6 g/dL (calc) (ref 1.9–3.7)
Glucose, Bld: 81 mg/dL (ref 65–99)
Potassium: 4.4 mmol/L (ref 3.5–5.3)
Sodium: 141 mmol/L (ref 135–146)
Total Bilirubin: 0.5 mg/dL (ref 0.2–1.2)
Total Protein: 7.3 g/dL (ref 6.1–8.1)
eGFR: 72 mL/min/{1.73_m2} (ref >=60)

## 2021-06-12 LAB — TSH: TSH: 0.5 mIU/L (ref 0.40–4.50)

## 2021-06-12 LAB — LIPID PANEL
Cholesterol: 150 mg/dL (ref <200)
HDL: 60 mg/dL (ref >=50)
LDL Cholesterol (Calc): 70 mg/dL (calc)
Non-HDL Cholesterol (Calc): 90 mg/dL (calc) (ref <130)
Total CHOL/HDL Ratio: 2.5 (calc) (ref <5.0)
Triglycerides: 114 mg/dL (ref <150)

## 2021-06-12 LAB — CBC WITH DIFFERENTIAL/PLATELET
Absolute Monocytes: 801 cells/uL (ref 200–950)
Basophils Absolute: 64 cells/uL (ref 0–200)
Basophils Relative: 0.7 %
Eosinophils Absolute: 118 cells/uL (ref 15–500)
Eosinophils Relative: 1.3 %
HCT: 42.3 % (ref 35.0–45.0)
Hemoglobin: 13.9 g/dL (ref 11.7–15.5)
Lymphs Abs: 1847 cells/uL (ref 850–3900)
MCH: 29.6 pg (ref 27.0–33.0)
MCHC: 32.9 g/dL (ref 32.0–36.0)
MCV: 90.2 fL (ref 80.0–100.0)
MPV: 9.4 fL (ref 7.5–12.5)
Monocytes Relative: 8.8 %
Neutro Abs: 6270 cells/uL (ref 1500–7800)
Neutrophils Relative %: 68.9 %
Platelets: 254 10*3/uL (ref 140–400)
RBC: 4.69 10*6/uL (ref 3.80–5.10)
RDW: 13.1 % (ref 11.0–15.0)
Total Lymphocyte: 20.3 %
WBC: 9.1 10*3/uL (ref 3.8–10.8)

## 2021-07-17 ENCOUNTER — Other Ambulatory Visit: Payer: Self-pay | Admitting: Adult Health

## 2021-07-30 DIAGNOSIS — Z85828 Personal history of other malignant neoplasm of skin: Secondary | ICD-10-CM | POA: Diagnosis not present

## 2021-07-30 DIAGNOSIS — L570 Actinic keratosis: Secondary | ICD-10-CM | POA: Diagnosis not present

## 2021-07-30 DIAGNOSIS — D1801 Hemangioma of skin and subcutaneous tissue: Secondary | ICD-10-CM | POA: Diagnosis not present

## 2021-07-30 DIAGNOSIS — L821 Other seborrheic keratosis: Secondary | ICD-10-CM | POA: Diagnosis not present

## 2021-09-10 ENCOUNTER — Other Ambulatory Visit: Payer: Self-pay | Admitting: Nurse Practitioner

## 2021-09-10 DIAGNOSIS — E039 Hypothyroidism, unspecified: Secondary | ICD-10-CM

## 2021-09-12 ENCOUNTER — Ambulatory Visit: Payer: PPO | Admitting: Nurse Practitioner

## 2021-09-12 ENCOUNTER — Encounter: Payer: Self-pay | Admitting: Nurse Practitioner

## 2021-09-12 VITALS — HR 67 | Temp 97.9°F

## 2021-09-12 DIAGNOSIS — U071 COVID-19: Secondary | ICD-10-CM

## 2021-09-12 DIAGNOSIS — Z1152 Encounter for screening for COVID-19: Secondary | ICD-10-CM

## 2021-09-12 LAB — POC COVID19 BINAXNOW: SARS Coronavirus 2 Ag: POSITIVE — AB

## 2021-09-12 MED ORDER — DEXAMETHASONE 1 MG PO TABS: ORAL_TABLET | ORAL | 0 refills | Status: DC

## 2021-09-12 MED ORDER — MOLNUPIRAVIR EUA 200MG CAPSULE: 4.0000 | ORAL_CAPSULE | Freq: Two times a day (BID) | ORAL | 0 refills | Status: AC

## 2021-09-12 MED ORDER — AZITHROMYCIN 250 MG PO TABS: ORAL_TABLET | ORAL | 1 refills | Status: DC

## 2021-09-12 NOTE — Progress Notes (Signed)
THIS ENCOUNTER IS A VIRTUAL VISIT DUE TO COVID-19 - PATIENT WAS NOT SEEN IN THE OFFICE.  PATIENT HAS CONSENTED TO VIRTUAL VISIT / TELEMEDICINE VISIT   Virtual Visit via telephone Note  I connected with  Haley Shah on 09/12/2021 by telephone.  I verified that I am speaking with the correct person using two identifiers.    I discussed the limitations of evaluation and management by telemedicine and the availability of in person appointments. The patient expressed understanding and agreed to proceed.  History of Present Illness:  Pulse 67   Temp 97.9 F (36.6 C)   SpO2 97%  81 y.o. patient contacted office reporting URI sx . she tested positive by test at office parking lot today. OV was conducted by telephone to minimize exposure. This patient was vaccinated for covid 19, last 12/21 Immunization History  Administered Date(s) Administered   Influenza, High Dose Seasonal PF 01/05/2013, 01/05/2015, 11/27/2015, 12/20/2016, 12/15/2017, 12/17/2018, 01/17/2020, 01/01/2021   PFIZER(Purple Top)SARS-COV-2 Vaccination 04/25/2019, 05/19/2019, 03/13/2020   Pneumococcal Conjugate-13 03/02/2015   Pneumococcal Polysaccharide-23 03/18/2008   Td 03/18/2005   Tdap 12/01/2018    Sx began 1 days ago with sore throat and congestion  Treatments tried so far: None  Exposures: husband   Medications  Current Outpatient Medications (Endocrine & Metabolic):    levothyroxine (SYNTHROID) 100 MCG tablet, TAKE 1 TABLET BY MOUTH IN THE MORNING ON AN EMPTY STOMACH WITH  ONLY  WATER  FOR  30  MINUTES  AND  NO  ANTACID  MEDICATIONS  FOR  4  HOURS  Current Outpatient Medications (Cardiovascular):    rosuvastatin (CRESTOR) 40 MG tablet, TAKE 1 TABLET BY MOUTH 5 DAYS PER WEEK OR AS DIRECTED (SKIP TUESDAY AND SATURDAY) FOR CHOLESTEROL   Current Outpatient Medications (Analgesics):    aspirin 81 MG tablet, Take 81 mg by mouth daily.  Current Outpatient Medications (Hematological):    Cyanocobalamin (B-12  PO), Take by mouth daily. PRN  Current Outpatient Medications (Other):    dicyclomine (BENTYL) 20 MG tablet, Take  1/2 to 1 tablet  3 to 4 x /day as needed for Abdominal cramping. (Patient taking differently: Take  1/2 to 1 tablet  3 to 4 x /day as needed for Abdominal cramping. PRN)   latanoprost (XALATAN) 0.005 % ophthalmic solution, Place 1 drop into both eyes at bedtime.   OVER THE COUNTER MEDICATION, OTC Fiber Gummies  OR   Benefiber.   VITAMIN D, CHOLECALCIFEROL, PO, Take 2,000 Int'l Units by mouth.  Allergies:  Allergies  Allergen Reactions   Naprosyn [Naproxen]     Raw mouth   Neomycin-Bacitracin Zn-Polymyx     REACTION: rash   Propoxyphene Hcl     REACTION: syncope   Darvon Other (See Comments)    Almost fainted when taking, over 50 years.    Problem list She has Hypothyroidism; Hyperlipidemia; GERD; IBS; Vertigo; Thyroid Cancer,Hx; History of left breast cancer; Other abnormal glucose (hx of prediabetes); Vitamin D deficiency; Medication management; Essential hypertension; COPD (chronic obstructive pulmonary disease) ([place]); Primary osteoarthritis of both knees; B12 deficiency; and Osteopenia on their problem list.   Social History:   reports that she has never smoked. She has never used smokeless tobacco. She reports that she does not drink alcohol and does not use drugs.  Observations/Objective:  General : Well sounding patient in no apparent distress HEENT: no hoarseness, no cough for duration of visit Lungs: speaks in complete sentences, no audible wheezing, no apparent distress Neurological: alert, oriented x 3  Psychiatric: pleasant, judgement appropriate   Assessment and Plan:  Covid 19 Covid 19 positive per rapid screening test in parking lot of office today Risk factors include: COPD, Hypertension, Hx thyroid CA/Breast CA Symptoms are: mild Due to co morbid conditions and risk factors, discussed antivirals  Immue support reviewed  Take tylenol PRN temp  101+ Push hydration Regular ambulation or calf exercises exercises for clot prevention and 81 mg ASA unless contraindicated Sx supportive therapy suggested Follow up via mychart or telephone if needed Advised patient obtain O2 monitor; present to ED if persistently <90% or with severe dyspnea, CP, fever uncontrolled by tylenol, confusion, sudden decline Should remain in isolation until at least 5 days from onset of sx, 24-48 hours fever free without tylenol, sx such as cough are improved.     Encounter for screening for COVID-19 -     POC COVID-19  COVID -     molnupiravir EUA (LAGEVRIO) 200 mg CAPS capsule; Take 4 capsules (800 mg total) by mouth 2 (two) times daily for 5 days. -     dexamethasone (DECADRON) 1 MG tablet; Take 3 tabs for 3 days, 2 tabs for 3 days 1 tab for 5 days. Take with food. -     azithromycin (ZITHROMAX) 250 MG tablet; Take 2 tablets (500 mg) on  Day 1,  followed by 1 tablet (250 mg) once daily on Days 2 through 5.       Follow Up Instructions:  I discussed the assessment and treatment plan with the patient. The patient was provided an opportunity to ask questions and all were answered. The patient agreed with the plan and demonstrated an understanding of the instructions.   The patient was advised to call back or seek an in-person evaluation if the symptoms worsen or if the condition fails to improve as anticipated.  I provided 20 minutes of non-face-to-face time during this encounter.   Alycia Rossetti, NP

## 2021-09-17 ENCOUNTER — Encounter: Payer: Self-pay | Admitting: Nurse Practitioner

## 2021-11-08 DIAGNOSIS — K219 Gastro-esophageal reflux disease without esophagitis: Secondary | ICD-10-CM | POA: Diagnosis not present

## 2021-11-08 DIAGNOSIS — R42 Dizziness and giddiness: Secondary | ICD-10-CM | POA: Diagnosis not present

## 2021-11-08 DIAGNOSIS — G252 Other specified forms of tremor: Secondary | ICD-10-CM | POA: Diagnosis not present

## 2021-11-08 DIAGNOSIS — E039 Hypothyroidism, unspecified: Secondary | ICD-10-CM | POA: Diagnosis not present

## 2021-11-08 DIAGNOSIS — I700 Atherosclerosis of aorta: Secondary | ICD-10-CM | POA: Diagnosis not present

## 2021-11-08 DIAGNOSIS — G8929 Other chronic pain: Secondary | ICD-10-CM | POA: Diagnosis not present

## 2021-11-08 DIAGNOSIS — M1909 Primary osteoarthritis, other specified site: Secondary | ICD-10-CM | POA: Diagnosis not present

## 2021-11-08 DIAGNOSIS — J449 Chronic obstructive pulmonary disease, unspecified: Secondary | ICD-10-CM | POA: Diagnosis not present

## 2021-11-08 DIAGNOSIS — H401131 Primary open-angle glaucoma, bilateral, mild stage: Secondary | ICD-10-CM | POA: Diagnosis not present

## 2021-11-08 DIAGNOSIS — J309 Allergic rhinitis, unspecified: Secondary | ICD-10-CM | POA: Diagnosis not present

## 2021-11-08 DIAGNOSIS — E785 Hyperlipidemia, unspecified: Secondary | ICD-10-CM | POA: Diagnosis not present

## 2021-11-08 DIAGNOSIS — K588 Other irritable bowel syndrome: Secondary | ICD-10-CM | POA: Diagnosis not present

## 2021-11-15 DIAGNOSIS — H26493 Other secondary cataract, bilateral: Secondary | ICD-10-CM | POA: Diagnosis not present

## 2021-11-15 DIAGNOSIS — H401131 Primary open-angle glaucoma, bilateral, mild stage: Secondary | ICD-10-CM | POA: Diagnosis not present

## 2021-11-29 DIAGNOSIS — H26492 Other secondary cataract, left eye: Secondary | ICD-10-CM | POA: Diagnosis not present

## 2021-11-29 DIAGNOSIS — H43813 Vitreous degeneration, bilateral: Secondary | ICD-10-CM | POA: Diagnosis not present

## 2021-12-04 ENCOUNTER — Other Ambulatory Visit: Payer: Self-pay

## 2021-12-04 DIAGNOSIS — E039 Hypothyroidism, unspecified: Secondary | ICD-10-CM

## 2021-12-04 MED ORDER — LEVOTHYROXINE SODIUM 100 MCG PO TABS: ORAL_TABLET | ORAL | 0 refills | Status: DC

## 2022-01-05 ENCOUNTER — Other Ambulatory Visit: Payer: Self-pay | Admitting: Internal Medicine

## 2022-01-05 DIAGNOSIS — E039 Hypothyroidism, unspecified: Secondary | ICD-10-CM

## 2022-01-05 MED ORDER — LEVOTHYROXINE SODIUM 100 MCG PO TABS: ORAL_TABLET | ORAL | 3 refills | Status: DC

## 2022-01-16 ENCOUNTER — Encounter: Payer: PPO | Admitting: Adult Health

## 2022-01-16 ENCOUNTER — Ambulatory Visit (INDEPENDENT_AMBULATORY_CARE_PROVIDER_SITE_OTHER): Payer: PPO | Admitting: Nurse Practitioner

## 2022-01-16 ENCOUNTER — Encounter: Payer: Self-pay | Admitting: Nurse Practitioner

## 2022-01-16 VITALS — BP 155/90 | HR 62 | Temp 97.5°F | Ht 60.0 in | Wt 125.2 lb

## 2022-01-16 DIAGNOSIS — E039 Hypothyroidism, unspecified: Secondary | ICD-10-CM

## 2022-01-16 DIAGNOSIS — Z79899 Other long term (current) drug therapy: Secondary | ICD-10-CM

## 2022-01-16 DIAGNOSIS — Z136 Encounter for screening for cardiovascular disorders: Secondary | ICD-10-CM | POA: Diagnosis not present

## 2022-01-16 DIAGNOSIS — Z853 Personal history of malignant neoplasm of breast: Secondary | ICD-10-CM | POA: Diagnosis not present

## 2022-01-16 DIAGNOSIS — E559 Vitamin D deficiency, unspecified: Secondary | ICD-10-CM

## 2022-01-16 DIAGNOSIS — Z131 Encounter for screening for diabetes mellitus: Secondary | ICD-10-CM

## 2022-01-16 DIAGNOSIS — Z0000 Encounter for general adult medical examination without abnormal findings: Secondary | ICD-10-CM | POA: Diagnosis not present

## 2022-01-16 DIAGNOSIS — I10 Essential (primary) hypertension: Secondary | ICD-10-CM | POA: Diagnosis not present

## 2022-01-16 DIAGNOSIS — J449 Chronic obstructive pulmonary disease, unspecified: Secondary | ICD-10-CM

## 2022-01-16 DIAGNOSIS — Z85850 Personal history of malignant neoplasm of thyroid: Secondary | ICD-10-CM

## 2022-01-16 DIAGNOSIS — E782 Mixed hyperlipidemia: Secondary | ICD-10-CM | POA: Diagnosis not present

## 2022-01-16 DIAGNOSIS — M8580 Other specified disorders of bone density and structure, unspecified site: Secondary | ICD-10-CM

## 2022-01-16 DIAGNOSIS — Z0001 Encounter for general adult medical examination with abnormal findings: Secondary | ICD-10-CM

## 2022-01-16 DIAGNOSIS — Z1389 Encounter for screening for other disorder: Secondary | ICD-10-CM | POA: Diagnosis not present

## 2022-01-16 DIAGNOSIS — K588 Other irritable bowel syndrome: Secondary | ICD-10-CM

## 2022-01-16 DIAGNOSIS — K219 Gastro-esophageal reflux disease without esophagitis: Secondary | ICD-10-CM

## 2022-01-16 DIAGNOSIS — J302 Other seasonal allergic rhinitis: Secondary | ICD-10-CM

## 2022-01-16 NOTE — Patient Instructions (Signed)

## 2022-01-16 NOTE — Progress Notes (Signed)
CPE   Assessment:    Encounter for Annual Physical Exam with abnormal findings Due annually  Health Maintenance reviewed Healthy lifestyle reviewed and goals set  History of prediabetes Education: Reviewed 'ABCs' of diabetes management  Discussed goals to be met and/or maintained include A1C (<7) Blood pressure (<130/80) Cholesterol (LDL <70) Continue Eye Exam yearly  Continue Dental Exam Q6 mo Discussed dietary recommendations Discussed Physical Activity recommendations Check A1C   OSTEOPENIA 01/2021 - T Score -2.0 Pursue a combination of weight-bearing exercises and strength training. Patients with severe mobility impairment should be referred for physical therapy. Advised on fall prevention measures including proper lighting in all rooms, removal of area rugs and floor clutter, use of walking devices as deemed appropriate, avoidance of uneven walking surfaces. Smoking cessation and moderate alcohol consumption if applicable Consume 997 to 1000 IU of vitamin D daily with a goal vitamin D serum value of 30 ng/mL or higher. Aim for 1000 to 1200 mg of elemental calcium daily through supplements and/or dietary sources.    HYPERLIPIDEMIA  Discussed lifestyle modifications. Recommended diet heavy in fruits and veggies, omega 3's. Decrease consumption of animal meats, cheeses, and dairy products. Remain active and exercise as tolerated. Continue to monitor. Check lipids/TSH   Allergy Avoid triggerd OTC antihistamine PRN  Vitamin D deficiency Continue vit D supplement for goal 60-100 Check during annual CPE  History of breast cancer Continue annual MGM - has scheduled 02/2022  Essential hypertension Discussed DASH (Dietary Approaches to Stop Hypertension) DASH diet is lower in sodium than a typical American diet. Cut back on foods that are high in saturated fat, cholesterol, and trans fats. Eat more whole-grain foods, fish, poultry, and nuts Remain active and  exercise as tolerated daily.  Monitor BP at home-Call if greater than 130/80.  Check CMP/CBC  Thyroid Cancer,Hx S/p resection remotely;  Continue thyroid replacement  Continue Levothyroxine. Reminded to take on an empty stomach 30-68mns before food.  Stop any Biotin Supplement 48-72 hours before next TSH level to reduce the risk of falsely low TSH levels. Continue to monitor.     Gastroesophageal reflux disease, esophagitis presence not specified No suspected reflux complications ([place]/stricture). Lifestyle modification:  wt loss, avoid meals 2-3h before bedtime. Consider eliminating food triggers:  chocolate, caffeine, EtOH, acid/spicy food.   Other irritable bowel syndrome GI following  Discussed probitoic, fiber, hydration, decrease stress, avoid triggers Increase in worsening symptoms, blood in stool, AB pain, etc call office  COPD By imaging, no smoking/exposure history, age related Denies sx; monitor  Screening for heart disease Monitor EKG  Screening for proteinuria and hematuria Monitor UA/CMP/Microalbumin  Medication Management All medications discussed and reviewed in full. All questions and concerns regarding medications addressed.    Orders Placed This Encounter  Procedures   CBC with Differential/Platelet   COMPLETE METABOLIC PANEL WITH GFR   Magnesium   Lipid panel   TSH   Hemoglobin A1c   VITAMIN D 25 Hydroxy (Vit-D Deficiency, Fractures)   Urinalysis, Routine w reflex microscopic   Microalbumin / creatinine urine ratio   EKG 12-Lead    Notify office for further evaluation and treatment, questions or concerns if any reported s/s fail to improve.   The patient was advised to call back or seek an in-person evaluation if any symptoms worsen or if the condition fails to improve as anticipated.   Further disposition pending results of labs. Discussed med's effects and SE's.    I discussed the assessment and treatment plan with the patient.  The  patient was provided an opportunity to ask questions and all were answered. The patient agreed with the plan and demonstrated an understanding of the instructions.  Discussed med's effects and SE's. Screening labs and tests as requested with regular follow-up as recommended.  I provided 35 minutes of face-to-face time during this encounter including counseling, chart review, and critical decision making was preformed.   Future Appointments  Date Time Provider [place]  06/10/2022 11:00 AM Alycia Rossetti, NP [facility] None  01/20/2023 10:00 AM Darrol Jump, NP [facility] None      Subjective:   Haley Shah is a 81 y.o. female who presents for CPE and follow up. She has Hypothyroidism; Hyperlipidemia; GERD; IBS; Vertigo; Thyroid Cancer,Hx; History of left breast cancer; Other abnormal glucose (hx of prediabetes); Vitamin D deficiency; Medication management; Essential hypertension; COPD (chronic obstructive pulmonary disease) ([place]); Primary osteoarthritis of both knees; B12 deficiency; and Osteopenia on their problem list.  She is married, 2 children, 5 grandchilren, 1 great granchild, all local. 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   Overall she reports feeling well.    She had Covid back in 08/2021.  She has no residual symptoms.  Has history of left breast cancer in 2009, continues with annual mammograms, had negative in 01/2020.   She has COPD/hyperinflation by imaging on CXR since 2010; denies history of smoking, denies dyspnea, cough, secretions.   She has benign essential tremor, mild, worse in AM, declines medication.   She follows with Dr. Katy Fitch for cataracts, dry eyes.    Last seen 10/2021.  She follows GI for IBS.  Managed fairly well.   She follows Dermatology, Dr. Ronnald Ramp.  Last seen 07/30/21.  BMI is Body mass index is 24.45 kg/m., she has been working on diet. Wt Readings from Last 3 Encounters:  01/16/22 125 lb 3.2 oz (56.8 kg)   06/11/21 122 lb 3.2 oz (55.4 kg)  02/14/21 120 lb 12.8 oz (54.8 kg)   BP typically well conrolled at home, admits hasn't been checking recently, today their BP is BP: (!) 190/80, remains elevated on manyal recheck She does not workout. She denies chest pain, shortness of breath, dizziness.    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 Avoids NSAIDs and pushing water getting 65 fluid ounces daily  Lab Results  Component Value Date   GFRNONAA 71 06/08/2020   She is on cholesterol medication, rosuvastatin 40 mg tab 5 days a week.Her cholesterol is at goal. The cholesterol last visit was:   Lab Results  Component Value Date   CHOL 150 06/11/2021   HDL 60 06/11/2021   LDLCALC 70 06/11/2021   TRIG 114 06/11/2021   CHOLHDL 2.5 06/11/2021     Last A1C in the office was:  Lab Results  Component Value Date   HGBA1C 5.2 01/16/2021   Patient is on Vitamin D supplement. Lab Results  Component Value Date   VD25OH 71 01/16/2021   She is on thyroid medication following partial thyroidectomy for thyroid tumor. Her medication was not changed last visit, she is on 1/2 pill rest of the week and 1 pill M,W,F.  Patient denies nervousness and palpitations.  Lab Results  Component Value Date   TSH 0.50 06/11/2021   She reports did start on PO B12, not SL, not sure of levels.  Lab Results  Component Value Date   [id number] 06/08/2020     Medication Review Current Outpatient Medications on File Prior to Visit  Medication Sig  Dispense Refill   aspirin 81 MG tablet Take 81 mg by mouth daily.     dicyclomine (BENTYL) 20 MG tablet Take  1/2 to 1 tablet  3 to 4 x /day as needed for Abdominal cramping. (Patient taking differently: Take  1/2 to 1 tablet  3 to 4 x /day as needed for Abdominal cramping. PRN) 90 tablet 1   latanoprost (XALATAN) 0.005 % ophthalmic solution Place 1 drop into both eyes at bedtime.     levothyroxine (SYNTHROID) 100 MCG tablet Take  1 tablet  Daily  on an empty stomach with only water  for 30 minutes & no Antacid meds, Calcium or Magnesium for 4 hours & avoid Biotin                                                          /                                 TAKE BY                                MOUTH                                   ONCE DAILY 90 tablet 3   OVER THE COUNTER MEDICATION OTC Fiber Gummies  OR   Benefiber.     rosuvastatin (CRESTOR) 40 MG tablet TAKE 1 TABLET BY MOUTH 5 DAYS PER WEEK OR AS DIRECTED (SKIP TUESDAY AND SATURDAY) FOR CHOLESTEROL 60 tablet 3   VITAMIN D, CHOLECALCIFEROL, PO Take 2,000 Int'l Units by mouth.     Cyanocobalamin (B-12 PO) Take by mouth daily. PRN (Patient not taking: Reported on 01/16/2022)     dexamethasone (DECADRON) 1 MG tablet Take 3 tabs for 3 days, 2 tabs for 3 days 1 tab for 5 days. Take with food. 20 tablet 0   No current facility-administered medications on file prior to visit.    Current Problems (verified) Patient Active Problem List   Diagnosis Date Noted   Osteopenia 01/16/2021   B12 deficiency 01/18/2020   Primary osteoarthritis of both knees 04/08/2019   COPD (chronic obstructive pulmonary disease) ([place]) 12/14/2017   Essential hypertension 10/27/2013   Medication management 04/23/2013   Other abnormal glucose (hx of prediabetes)    Vitamin D deficiency    History of left breast cancer 11/03/2012   Hypothyroidism 03/22/2008   Hyperlipidemia 03/22/2008   GERD 03/22/2008   IBS 03/22/2008   Vertigo 03/22/2008   Thyroid Cancer,Hx 03/22/2008    Screening Tests Immunization History  Administered Date(s) Administered   Fluad Quad(high Dose 65+) 12/30/2021   Influenza, High Dose Seasonal PF 01/05/2013, 01/05/2015, 11/27/2015, 12/20/2016, 12/15/2017, 12/17/2018, 01/17/2020, 01/01/2021   PFIZER(Purple Top)SARS-COV-2 Vaccination 04/25/2019, 05/19/2019, 03/13/2020   Pneumococcal Conjugate-13 03/02/2015   Pneumococcal Polysaccharide-23 03/18/2008   Td 03/18/2005   Tdap 12/01/2018    Preventative care: Last  colonoscopy: 2011 due 10 years, declines - will consider but not at this time; discussed risk of colon cancer, she does have excellent life expectancy, negative cologuard 07/18/2020  Last mammogram: 11//2022 Scheduled for 01/2022 Last  pap smear/pelvic exam: 2012 declines another DEXA: 01/2021, osteopenia, spine T -2.01 due 01/2023  Echo 2010 CXR 04/2019   Prior vaccinations: TD or Tdap: 2020 Influenza: 12/2021 Pneumococcal: 2010 Prevnar 13: 2016 Shingles/Zostavax: declines Covid 19: 2/2, 2021, pfizer last booster   Names of Other Physician/Practitioners you currently use: 1. [facility]- here for primary care 2. Dr. Katy Fitch, eye doctor, last 2023 3.  Dr. Buelah Manis, dentist, last visit 2023, has scheduled next month  4. Dr. Danella Sensing, derm, last visit 2023, goes annually for full body checks  Patient Care Team: Unk Pinto, MD as PCP - General (Internal Medicine)   Allergies Allergies  Allergen Reactions   Naprosyn [Naproxen]     Raw mouth   Neomycin-Bacitracin Zn-Polymyx     REACTION: rash   Propoxyphene Hcl     REACTION: syncope   Darvon Other (See Comments)    Almost fainted when taking, over 50 years.    SURGICAL HISTORY She  has a past surgical history that includes Thyroid surgery (1986); Breast lumpectomy (2010); Colonoscopy; Knee arthroscopy with medial menisectomy (Left, 05/25/2014); Knee arthroscopy with lateral menisectomy (Left, 05/25/2014); Chondroplasty (Left, 05/25/2014); Rotator cuff repair (Right, 05/27/2018); Cataract extraction, bilateral (Bilateral); and Cardiac catheterization (2006). FAMILY HISTORY Her family history includes Cancer in her father; Colon cancer in her father; Kidney disease in her paternal grandmother; Ovarian cancer in her sister; Stroke in her father and paternal grandfather. SOCIAL HISTORY She  reports that she has never smoked. She has never used smokeless tobacco. She reports that she does  not drink alcohol and does not use drugs.   Review of Systems  Constitutional:  Negative for malaise/fatigue and weight loss.  HENT:  Negative for hearing loss and tinnitus.   Eyes:  Negative for blurred vision and double vision.  Respiratory:  Negative for cough, sputum production, shortness of breath and wheezing.   Cardiovascular:  Negative for chest pain, palpitations, orthopnea, claudication, leg swelling and PND.  Gastrointestinal:  Negative for abdominal pain, blood in stool, constipation, diarrhea, heartburn, melena, nausea and vomiting.  Genitourinary: Negative.   Musculoskeletal:  Negative for falls, joint pain and myalgias.  Skin:  Negative for rash.  Neurological:  Negative for dizziness, tingling, sensory change, weakness and headaches.  Endo/Heme/Allergies:  Positive for environmental allergies. Negative for polydipsia.  Psychiatric/Behavioral: Negative.  Negative for depression, memory loss, substance abuse and suicidal ideas. The patient is not nervous/anxious and does not have insomnia.   All other systems reviewed and are negative.    Objective:   Blood pressure (!) 190/80, pulse 62, temperature (!) 97.5 F (36.4 C), height 5' (1.524 m), weight 125 lb 3.2 oz (56.8 kg), SpO2 99 %. Body mass index is 24.45 kg/m.  General appearance: alert, no distress, WD/WN,  female HEENT: normocephalic, sclerae anicteric, TMs pearly, nares patent, no discharge or erythema, pharynx normal Oral cavity: MMM, no lesions Neck: supple, no lymphadenopathy, no thyromegaly, no masses Heart: RRR, normal S1, S2, no murmurs Lungs: CTA bilaterally, no wheezes, rhonchi, or rales Abdomen: +bs, soft, non tender, non distended, no masses, no hepatomegaly, no splenomegaly Musculoskeletal: nontender, no swelling, no obvious deformity. Patient is able to ambulate well.  Extremities: no edema, no cyanosis, no clubbing Pulses: 2+ symmetric, upper and lower extremities, normal cap refill Neurological:  alert, oriented x 3, CN2-12 intact, strength normal upper extremities and lower extremities, sensation normal throughout, DTRs 2+ throughout, no cerebellar signs, gait normal Psychiatric: normal affect, behavior normal, pleasant  Skin: warm, dry,  intact; no concerning rash, lesions;  Breasts: patient declines, has mammogram scheduled GU: declines, no concerns  EKG: sinus bradycardia, IRBBB    Clothilde Tippetts, NP   01/16/2022

## 2022-01-17 DIAGNOSIS — H26492 Other secondary cataract, left eye: Secondary | ICD-10-CM | POA: Diagnosis not present

## 2022-01-17 DIAGNOSIS — H02834 Dermatochalasis of left upper eyelid: Secondary | ICD-10-CM | POA: Diagnosis not present

## 2022-01-17 DIAGNOSIS — H02831 Dermatochalasis of right upper eyelid: Secondary | ICD-10-CM | POA: Diagnosis not present

## 2022-01-17 DIAGNOSIS — H04123 Dry eye syndrome of bilateral lacrimal glands: Secondary | ICD-10-CM | POA: Diagnosis not present

## 2022-01-17 DIAGNOSIS — H1045 Other chronic allergic conjunctivitis: Secondary | ICD-10-CM | POA: Diagnosis not present

## 2022-01-17 DIAGNOSIS — Z961 Presence of intraocular lens: Secondary | ICD-10-CM | POA: Diagnosis not present

## 2022-01-17 DIAGNOSIS — H43813 Vitreous degeneration, bilateral: Secondary | ICD-10-CM | POA: Diagnosis not present

## 2022-01-17 DIAGNOSIS — H401131 Primary open-angle glaucoma, bilateral, mild stage: Secondary | ICD-10-CM | POA: Diagnosis not present

## 2022-01-17 LAB — LIPID PANEL
Cholesterol: 158 mg/dL (ref <200)
HDL: 58 mg/dL (ref >=50)
LDL Cholesterol (Calc): 83 mg/dL (calc)
Non-HDL Cholesterol (Calc): 100 mg/dL (calc) (ref <130)
Total CHOL/HDL Ratio: 2.7 (calc) (ref <5.0)
Triglycerides: 81 mg/dL (ref <150)

## 2022-01-17 LAB — CBC WITH DIFFERENTIAL/PLATELET
Absolute Monocytes: 777 cells/uL (ref 200–950)
Basophils Absolute: 63 cells/uL (ref 0–200)
Basophils Relative: 0.9 %
Eosinophils Absolute: 98 cells/uL (ref 15–500)
Eosinophils Relative: 1.4 %
HCT: 40.1 % (ref 35.0–45.0)
Hemoglobin: 13.5 g/dL (ref 11.7–15.5)
Lymphs Abs: 1806 cells/uL (ref 850–3900)
MCH: 30.9 pg (ref 27.0–33.0)
MCHC: 33.7 g/dL (ref 32.0–36.0)
MCV: 91.8 fL (ref 80.0–100.0)
MPV: 9.8 fL (ref 7.5–12.5)
Monocytes Relative: 11.1 %
Neutro Abs: 4256 cells/uL (ref 1500–7800)
Neutrophils Relative %: 60.8 %
Platelets: 246 10*3/uL (ref 140–400)
RBC: 4.37 10*6/uL (ref 3.80–5.10)
RDW: 12.6 % (ref 11.0–15.0)
Total Lymphocyte: 25.8 %
WBC: 7 10*3/uL (ref 3.8–10.8)

## 2022-01-17 LAB — HEMOGLOBIN A1C
Hgb A1c MFr Bld: 5.5 % of total Hgb (ref <5.7)
Mean Plasma Glucose: 111 mg/dL
eAG (mmol/L): 6.2 mmol/L

## 2022-01-17 LAB — COMPLETE METABOLIC PANEL WITH GFR
AG Ratio: 1.8 (calc) (ref 1.0–2.5)
ALT: 9 U/L (ref 6–29)
AST: 16 U/L (ref 10–35)
Albumin: 4.4 g/dL (ref 3.6–5.1)
Alkaline phosphatase (APISO): 64 U/L (ref 37–153)
BUN: 16 mg/dL (ref 7–25)
CO2: 27 mmol/L (ref 20–32)
Calcium: 9.7 mg/dL (ref 8.6–10.4)
Chloride: 105 mmol/L (ref 98–110)
Creat: 0.86 mg/dL (ref 0.60–0.95)
Globulin: 2.5 g/dL (calc) (ref 1.9–3.7)
Glucose, Bld: 80 mg/dL (ref 65–99)
Potassium: 4.7 mmol/L (ref 3.5–5.3)
Sodium: 142 mmol/L (ref 135–146)
Total Bilirubin: 0.5 mg/dL (ref 0.2–1.2)
Total Protein: 6.9 g/dL (ref 6.1–8.1)
eGFR: 67 mL/min/{1.73_m2} (ref >=60)

## 2022-01-17 LAB — MICROALBUMIN / CREATININE URINE RATIO
Creatinine, Urine: 15 mg/dL — ABNORMAL LOW (ref 20–275)
Microalb Creat Ratio: 27 mcg/mg creat (ref <30)
Microalb, Ur: 0.4 mg/dL

## 2022-01-17 LAB — URINALYSIS, ROUTINE W REFLEX MICROSCOPIC
Bacteria, UA: NONE SEEN /HPF
Bilirubin Urine: NEGATIVE
Glucose, UA: NEGATIVE
Hyaline Cast: NONE SEEN /LPF
Ketones, ur: NEGATIVE
Leukocytes,Ua: NEGATIVE
Nitrite: NEGATIVE
Protein, ur: NEGATIVE
RBC / HPF: NONE SEEN /HPF (ref 0–2)
Specific Gravity, Urine: 1.003 (ref 1.001–1.035)
Squamous Epithelial / HPF: NONE SEEN /HPF (ref <=5)
WBC, UA: NONE SEEN /HPF (ref 0–5)
pH: 6 (ref 5.0–8.0)

## 2022-01-17 LAB — MICROSCOPIC MESSAGE

## 2022-01-17 LAB — MAGNESIUM: Magnesium: 2 mg/dL (ref 1.5–2.5)

## 2022-01-17 LAB — VITAMIN D 25 HYDROXY (VIT D DEFICIENCY, FRACTURES): Vit D, 25-Hydroxy: 63 ng/mL (ref 30–100)

## 2022-01-17 LAB — TSH: TSH: 1.19 mIU/L (ref 0.40–4.50)

## 2022-01-28 ENCOUNTER — Encounter: Payer: Self-pay | Admitting: Nurse Practitioner

## 2022-01-30 ENCOUNTER — Ambulatory Visit: Payer: PPO | Admitting: Nurse Practitioner

## 2022-02-20 DIAGNOSIS — Z1231 Encounter for screening mammogram for malignant neoplasm of breast: Secondary | ICD-10-CM | POA: Diagnosis not present

## 2022-03-04 DIAGNOSIS — R928 Other abnormal and inconclusive findings on diagnostic imaging of breast: Secondary | ICD-10-CM | POA: Diagnosis not present

## 2022-03-04 LAB — HM MAMMOGRAPHY

## 2022-04-08 DIAGNOSIS — M1711 Unilateral primary osteoarthritis, right knee: Secondary | ICD-10-CM | POA: Diagnosis not present

## 2022-04-09 ENCOUNTER — Encounter: Payer: Self-pay | Admitting: Internal Medicine

## 2022-06-08 NOTE — Progress Notes (Deleted)
ANNUAL MEDICARE WELLNESS VISIT   Assessment:    Encounter for Medicare wWellness visit Due 1 year; declines colonoscopy   History of prediabetes Recent A1Cs at goal Discussed diet/exercise, weight management  Check A1C annually  OSTEOPENIA Due for DEXA 01/2021-order placed, cont vitamin D, calcium, weight bearing exerces   HYPERLIPIDEMIA  -continue medications, LDL goal <100, check lipids, decrease fatty foods, increase activity.  - TSH - CMP/GFR - Lipid panel   Allergy Avoid triggerd, OTC agent PRN, monitor  Vitamin D deficiency Continue vit D supplement for goal 60-100; check annually and PRN - Vit D  25 hydroxy (rtn osteoporosis monitoring)  Medication Mangaement - Magnesium  History of breast cancer Continue MGM  Essential hypertension Recently fairly controlled without medicatoins Monitor blood pressure at home; call if consistently over 130/80 Continue DASH diet.   Reminder to go to the ER if any CP, SOB, nausea, dizziness, severe HA, changes vision/speech, left arm numbness and tingling and jaw pain. -     CBC with Differential/Platelet -     CMP/GFR -     TSH  Thyroid Cancer,Hx S/p resection remotely;  Continue thyroid replacement   HYPOTHYROIDISM -check TSH level, continue medications the same, reminded to take on an empty stomach 30-2mins before food.  - TSH  Gastroesophageal reflux disease, esophagitis presence not specified Fairly managed off of medications recently  Discussed diet, avoiding triggers and other lifestyle changes  Other irritable bowel syndrome If not on benefiber then add it, decrease stress,  if any worsening symptoms, blood in stool, AB pain, etc call office  COPD By imaging, no smoking/exposure history, age related Trelegy sample given Denies sx; monitor     Future Appointments  Date Time Provider [place]  06/10/2022 11:00 AM Alycia Rossetti, NP [facility] None  01/20/2023 10:00 AM Darrol Jump, NP  [facility] None  06/10/2023 11:00 AM Alycia Rossetti, NP [facility] None     Subjective:   Haley Shah is a 82 y.o. female who presents for AWV and 3 month follow up. She has Hypothyroidism; Hyperlipidemia; GERD; IBS; Vertigo; Thyroid Cancer,Hx; History of left breast cancer; Other abnormal glucose (hx of prediabetes); Vitamin D deficiency; Medication management; Essential hypertension; COPD (chronic obstructive pulmonary disease) ([place]); Primary osteoarthritis of both knees; B12 deficiency; and Osteopenia on their problem list.  She is married, 2 children, 5 grandchilren, 1 great granchild, all local. 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   Has history of left breast cancer in 2009, continues with annual mammograms, had negative in 01/2020.   She has COPD/hyperinflation by imaging on CXR since 2010; denies history of smoking, denies dyspnea, cough, secretions.   She has bil knee arthritis, has seen ortho for injections, declined surgery. Does have some pain, takes tylenol PRN if will be walking a lot but prefers to avoid medication, will use topicals occasional.   She has benign essential tremor, mild, worse in AM, declines medication.   BMI is There is no height or weight on file to calculate BMI., she has been working on diet, admits no intentional exercise due to knees, but very active in home and yard.  Pushes water, 64 fluid ounces most days Wt Readings from Last 3 Encounters:  01/16/22 125 lb 3.2 oz (56.8 kg)  06/11/21 122 lb 3.2 oz (55.4 kg)  02/14/21 120 lb 12.8 oz (54.8 kg)   Her blood pressure has been controlled at home, today their BP is    BP Readings from Last 3  Encounters:  01/16/22 (!) 155/90  06/11/21 140/78  02/14/21 134/76    She does not workout. She denies chest pain, shortness of breath, dizziness.    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 Avoids NSAIDs and pushing water getting 65 fluid ounces daily  Lab Results  Component Value Date   GFRNONAA  71 06/08/2020   She is on cholesterol medication, rosuvastatin 40 mg tab 5 days a week.Her cholesterol is at goal. The cholesterol last visit was:   Lab Results  Component Value Date   CHOL 158 01/16/2022   HDL 58 01/16/2022   LDLCALC 83 01/16/2022   TRIG 81 01/16/2022   CHOLHDL 2.7 01/16/2022     Last A1C in the office was:  Lab Results  Component Value Date   HGBA1C 5.5 01/16/2022   Patient is on Vitamin D supplement. Lab Results  Component Value Date   VD25OH 89 01/16/2022   She is on thyroid medication following partial thyroidectomy for thyroid tumor. Her medication was not changed last visit, she is on 1/2 pill rest of the week and 1 pill M,W,F.  Patient denies nervousness and palpitations.  Lab Results  Component Value Date   TSH 1.19 01/16/2022   She reports did start on PO B12, not SL, not sure of levels.  Lab Results  Component Value Date   [id number] 06/08/2020     Medication Review Current Outpatient Medications on File Prior to Visit  Medication Sig Dispense Refill   aspirin 81 MG tablet Take 81 mg by mouth daily.     Cyanocobalamin (B-12 PO) Take by mouth daily. PRN (Patient not taking: Reported on 01/16/2022)     dexamethasone (DECADRON) 1 MG tablet Take 3 tabs for 3 days, 2 tabs for 3 days 1 tab for 5 days. Take with food. 20 tablet 0   dicyclomine (BENTYL) 20 MG tablet Take  1/2 to 1 tablet  3 to 4 x /day as needed for Abdominal cramping. (Patient taking differently: Take  1/2 to 1 tablet  3 to 4 x /day as needed for Abdominal cramping. PRN) 90 tablet 1   latanoprost (XALATAN) 0.005 % ophthalmic solution Place 1 drop into both eyes at bedtime.     levothyroxine (SYNTHROID) 100 MCG tablet Take  1 tablet  Daily  on an empty stomach with only water for 30 minutes & no Antacid meds, Calcium or Magnesium for 4 hours & avoid Biotin                                                          /                                 TAKE BY                                 MOUTH                                   ONCE DAILY 90 tablet 3   OVER THE COUNTER MEDICATION OTC Fiber Gummies  OR   Benefiber.     rosuvastatin (CRESTOR)  40 MG tablet TAKE 1 TABLET BY MOUTH 5 DAYS PER WEEK OR AS DIRECTED (SKIP TUESDAY AND SATURDAY) FOR CHOLESTEROL 60 tablet 3   VITAMIN D, CHOLECALCIFEROL, PO Take 2,000 Int'l Units by mouth.     No current facility-administered medications on file prior to visit.    Current Problems (verified) Patient Active Problem List   Diagnosis Date Noted   Osteopenia 01/16/2021   B12 deficiency 01/18/2020   Primary osteoarthritis of both knees 04/08/2019   COPD (chronic obstructive pulmonary disease) ([place]) 12/14/2017   Essential hypertension 10/27/2013   Medication management 04/23/2013   Other abnormal glucose (hx of prediabetes)    Vitamin D deficiency    History of left breast cancer 11/03/2012   Hypothyroidism 03/22/2008   Hyperlipidemia 03/22/2008   GERD 03/22/2008   IBS 03/22/2008   Vertigo 03/22/2008   Thyroid Cancer,Hx 03/22/2008    Screening Tests Immunization History  Administered Date(s) Administered   Fluad Quad(high Dose 65+) 12/30/2021   Influenza, High Dose Seasonal PF 01/05/2013, 01/05/2015, 11/27/2015, 12/20/2016, 12/15/2017, 12/17/2018, 01/17/2020, 01/01/2021   PFIZER(Purple Top)SARS-COV-2 Vaccination 04/25/2019, 05/19/2019, 03/13/2020   Pneumococcal Conjugate-13 03/02/2015   Pneumococcal Polysaccharide-23 03/18/2008   Td 03/18/2005   Tdap 12/01/2018    Preventative care: Last colonoscopy: 2011 due 10 years, Cologuard 07/2020 Negative Last mammogram: 01/2021 Last pap smear/pelvic exam: 2012 declines another DEXA: 01/2021, osteopenia, spine T -2.0 osteopenia  Echo 2010 CXR 04/2019   Prior vaccinations: TD or Tdap: 2020 Influenza: 12/2020 Pneumococcal: 2010 Prevnar 13: 2016 Shingles/Zostavax: declines Covid 19: 3/3, 2021, pfizer  Names of Other Physician/Practitioners you currently use: 1. [facility]- here for primary care 2. Dr. Katy Fitch, eye doctor, 03/2021 3.  Dr. Buelah Manis, dentist, last visit 05/2021 4. Dr. Danella Sensing, derm, last visit 2022, goes annually for full body checks  Patient Care Team: Unk Pinto, MD as PCP - General (Internal Medicine)   Allergies Allergies  Allergen Reactions   Naprosyn [Naproxen]     Raw mouth   Neomycin-Bacitracin Zn-Polymyx     REACTION: rash   Propoxyphene Hcl     REACTION: syncope   Darvon Other (See Comments)    Almost fainted when taking, over 50 years.    SURGICAL HISTORY She  has a past surgical history that includes Thyroid surgery (1986); Breast lumpectomy (2010); Colonoscopy; Knee arthroscopy with medial menisectomy (Left, 05/25/2014); Knee arthroscopy with lateral menisectomy (Left, 05/25/2014); Chondroplasty (Left, 05/25/2014); Rotator cuff repair (Right, 05/27/2018); Cataract extraction, bilateral (Bilateral); and Cardiac catheterization (2006). FAMILY HISTORY Her family history includes Cancer in her father; Colon cancer in her father; Kidney disease in her paternal grandmother; Ovarian cancer in her sister; Stroke in her father and paternal grandfather. SOCIAL HISTORY She  reports that she has never smoked. She has never used smokeless tobacco. She reports that she does not drink alcohol and does not use drugs.  MEDICARE WELLNESS OBJECTIVES: Physical activity:   Cardiac risk factors:   Depression/mood screen:      06/11/2021   10:50 AM  Depression screen PHQ 2/9  Decreased Interest 0  Down, Depressed, Hopeless 0  PHQ - 2 Score 0    ADLs:     06/11/2021   10:50 AM  In your present state of health, do you have any difficulty performing the following activities:  Hearing? 0  Vision? 0  Difficulty concentrating or making decisions? 0  Walking or climbing stairs? 0  Dressing or bathing? 0  Doing errands, shopping? 0     Cognitive  Testing  Alert? Yes  Normal  Appearance?Yes  Oriented to person? Yes  Place? Yes   Time? Yes  Recall of three objects?  Yes  Can perform simple calculations? Yes  Displays appropriate judgment?Yes  Can read the correct time from a watch face?Yes  EOL planning:       Review of Systems  Constitutional:  Negative for malaise/fatigue and weight loss.  HENT:  Negative for hearing loss and tinnitus.   Eyes:  Negative for blurred vision and double vision.  Respiratory:  Negative for cough, sputum production, shortness of breath and wheezing.   Cardiovascular:  Negative for chest pain, palpitations, orthopnea, claudication, leg swelling and PND.  Gastrointestinal:  Negative for abdominal pain, blood in stool, constipation, diarrhea, heartburn, melena, nausea and vomiting.  Genitourinary: Negative.   Musculoskeletal:  Positive for joint pain (knees). Negative for falls and myalgias.  Skin:  Negative for rash.  Neurological:  Negative for dizziness, tingling, sensory change, weakness and headaches.  Endo/Heme/Allergies:  Negative for polydipsia.  Psychiatric/Behavioral: Negative.  Negative for depression, memory loss, substance abuse and suicidal ideas. The patient is not nervous/anxious and does not have insomnia.   All other systems reviewed and are negative.    Objective:   There were no vitals taken for this visit. There is no height or weight on file to calculate BMI.  General appearance: alert, no distress, WD/WN,  female HEENT: normocephalic, sclerae anicteric, TMs pearly, nares patent, no discharge or erythema, pharynx normal Oral cavity: MMM, no lesions Neck: supple, no lymphadenopathy, no thyromegaly, no masses Heart: RRR, normal S1, S2, no murmurs Lungs: CTA bilaterally, no wheezes, rhonchi, or rales Abdomen: +bs, soft, non tender, non distended, no masses, no hepatomegaly, no splenomegaly Musculoskeletal: nontender, no swelling, no obvious deformity. Patient is able to ambulate well.  Extremities: no  edema, no cyanosis, no clubbing Pulses: 2+ symmetric, upper and lower extremities, normal cap refill Neurological: alert, oriented x 3, CN2-12 intact, strength normal upper extremities and lower extremities, sensation normal throughout, DTRs 2+ throughout, no cerebellar signs, gait normal Psychiatric: normal affect, behavior normal, pleasant  Skin: warm, dry, intact; no concerning rash, lesions;    Medicare Attestation I have personally reviewed: The patient's medical and social history Their use of alcohol, tobacco or illicit drugs Their current medications and supplements The patient's functional ability including ADLs,fall risks, home safety risks, cognitive, and hearing and visual impairment Diet and physical activities Evidence for depression or mood disorders  The patient's weight, height, BMI, and visual acuity have been recorded in the chart.  I have made referrals, counseling, and provided education to the patient based on review of the above and I have provided the patient with a written personalized care plan for preventive services.      Alycia Rossetti, NP   06/08/2022

## 2022-06-10 ENCOUNTER — Ambulatory Visit: Payer: PPO | Admitting: Nurse Practitioner

## 2022-06-10 ENCOUNTER — Ambulatory Visit (INDEPENDENT_AMBULATORY_CARE_PROVIDER_SITE_OTHER): Payer: PPO | Admitting: Nurse Practitioner

## 2022-06-10 ENCOUNTER — Encounter: Payer: Self-pay | Admitting: Nurse Practitioner

## 2022-06-10 VITALS — BP 138/64 | HR 73 | Temp 97.7°F | Ht 60.0 in | Wt 125.0 lb

## 2022-06-10 DIAGNOSIS — E039 Hypothyroidism, unspecified: Secondary | ICD-10-CM | POA: Diagnosis not present

## 2022-06-10 DIAGNOSIS — E559 Vitamin D deficiency, unspecified: Secondary | ICD-10-CM

## 2022-06-10 DIAGNOSIS — J302 Other seasonal allergic rhinitis: Secondary | ICD-10-CM

## 2022-06-10 DIAGNOSIS — J0100 Acute maxillary sinusitis, unspecified: Secondary | ICD-10-CM | POA: Diagnosis not present

## 2022-06-10 DIAGNOSIS — E782 Mixed hyperlipidemia: Secondary | ICD-10-CM

## 2022-06-10 DIAGNOSIS — K219 Gastro-esophageal reflux disease without esophagitis: Secondary | ICD-10-CM

## 2022-06-10 DIAGNOSIS — J449 Chronic obstructive pulmonary disease, unspecified: Secondary | ICD-10-CM

## 2022-06-10 DIAGNOSIS — R7309 Other abnormal glucose: Secondary | ICD-10-CM

## 2022-06-10 DIAGNOSIS — Z0000 Encounter for general adult medical examination without abnormal findings: Secondary | ICD-10-CM

## 2022-06-10 DIAGNOSIS — Z85850 Personal history of malignant neoplasm of thyroid: Secondary | ICD-10-CM

## 2022-06-10 DIAGNOSIS — I10 Essential (primary) hypertension: Secondary | ICD-10-CM

## 2022-06-10 DIAGNOSIS — Z853 Personal history of malignant neoplasm of breast: Secondary | ICD-10-CM

## 2022-06-10 DIAGNOSIS — Z79899 Other long term (current) drug therapy: Secondary | ICD-10-CM

## 2022-06-10 DIAGNOSIS — M8580 Other specified disorders of bone density and structure, unspecified site: Secondary | ICD-10-CM

## 2022-06-10 MED ORDER — AZITHROMYCIN 250 MG PO TABS: ORAL_TABLET | ORAL | 0 refills | Status: DC

## 2022-06-10 NOTE — Patient Instructions (Signed)

## 2022-06-10 NOTE — Progress Notes (Signed)
Assessment and Plan:  Haley Shah was seen today for cough.  Diagnoses and all orders for this visit:  Acute maxillary sinusitis, recurrence not specified Push fluids Use Allegra and Flonase daily Use Mucinex BID as needed Z- pak as directed If symptoms d o not resolve notify the office -     azithromycin (ZITHROMAX) 250 MG tablet; Take 2 tablets PO on day 1, then 1 tablet PO Q24H x 4 days  Hypothyroidism, unspecified type Continue levothyroxine 100 mcg 1/2 tab M,W,F and tab the other days. Please take your thyroid medication greater than 30 min before breakfast, separated by at least 4 hours  from antacids, calcium, iron, and multivitamins.          Further disposition pending results of labs. Discussed med's effects and SE's.   Over 30 minutes of exam, counseling, chart review, and critical decision making was performed.   Future Appointments  Date Time Provider [place]  06/18/2022 11:30 AM Alycia Rossetti, NP [facility] None  01/20/2023 10:00 AM Darrol Jump, NP [facility] None  06/10/2023 11:00 AM Alycia Rossetti, NP [facility] None    ------------------------------------------------------------------------------------------------------------------   HPI BP 138/64   Pulse 73   Temp 97.7 F (36.5 C)   Ht 5' (1.524 m)   Wt 125 lb (56.7 kg)   SpO2 99%   BMI 24.41 kg/m    82 y.o.female presents for eyes watering , dry cough and scratchy throat for several weeks.  Denies body aches, nausea, vomiting, diarrhea or fever. She normally takes allegra daily and will take Flonase occasionally. She has clear nasal drainage.    BP is currently well controlled without medication.  Denies chest pain, headaches, diaainess and shortness of breath.  BP Readings from Last 3 Encounters:  06/10/22 138/64  01/16/22 (!) 155/90  06/11/21 140/78   BMI is Body mass index is 24.41 kg/m., she has been working on diet and exercise. Wt Readings from Last 3 Encounters:  06/10/22  125 lb (56.7 kg)  01/16/22 125 lb 3.2 oz (56.8 kg)  06/11/21 122 lb 3.2 oz (55.4 kg)     She is currently on Levothyroxine 100 mcg Tues, Thur, Sat Sun and 1/2 tab M,W,F for hypothyroidism and is controlled, Last TSH was: Lab Results  Component Value Date   TSH 1.19 01/16/2022     Past Medical History:  Diagnosis Date   Allergy    Cancer ([place]) 2010   Left Breast   Dyspnea    chronic-no cardiac reason   GERD (gastroesophageal reflux disease)    Glaucoma    History of thyroid cancer    Hyperlipidemia    Hypertension    no meds   IBS (irritable bowel syndrome)    Pre-diabetes    Sciatica    RLE   Thyroid disease    Hypo   Vitamin D deficiency    Wears glasses      Allergies  Allergen Reactions   Naprosyn [Naproxen]     Raw mouth   Neomycin-Bacitracin Zn-Polymyx     REACTION: rash   Propoxyphene Hcl     REACTION: syncope   Darvon Other (See Comments)    Almost fainted when taking, over 50 years.    Current Outpatient Medications on File Prior to Visit  Medication Sig   aspirin 81 MG tablet Take 81 mg by mouth daily.   dicyclomine (BENTYL) 20 MG tablet Take  1/2 to 1 tablet  3 to 4 x /day as needed for Abdominal cramping. (Patient  taking differently: Take  1/2 to 1 tablet  3 to 4 x /day as needed for Abdominal cramping. PRN)   latanoprost (XALATAN) 0.005 % ophthalmic solution Place 1 drop into both eyes at bedtime.   levothyroxine (SYNTHROID) 100 MCG tablet Take  1 tablet  Daily  on an empty stomach with only water for 30 minutes & no Antacid meds, Calcium or Magnesium for 4 hours & avoid Biotin                                                          /                                 TAKE BY                                MOUTH                                   ONCE DAILY   rosuvastatin (CRESTOR) 40 MG tablet TAKE 1 TABLET BY MOUTH 5 DAYS PER WEEK OR AS DIRECTED (SKIP TUESDAY AND SATURDAY) FOR CHOLESTEROL   VITAMIN D, CHOLECALCIFEROL, PO Take 2,000 Int'l Units by  mouth.   Wheat Dextrin (BENEFIBER PO) Take by mouth.   Cyanocobalamin (B-12 PO) Take by mouth daily. PRN (Patient not taking: Reported on 01/16/2022)   dexamethasone (DECADRON) 1 MG tablet Take 3 tabs for 3 days, 2 tabs for 3 days 1 tab for 5 days. Take with food. (Patient not taking: Reported on 06/10/2022)   OVER THE COUNTER MEDICATION OTC Fiber Gummies  OR   Benefiber. (Patient not taking: Reported on 06/10/2022)   No current facility-administered medications on file prior to visit.    ROS: all negative except above.   Physical Exam:  BP 138/64   Pulse 73   Temp 97.7 F (36.5 C)   Ht 5' (1.524 m)   Wt 125 lb (56.7 kg)   SpO2 99%   BMI 24.41 kg/m   General Appearance: Well nourished, in no apparent distress. Eyes: PERRLA, EOMs, conjunctiva no swelling or erythema Sinuses: Positive maxillary tenderness bilaterally ENT/Mouth: Ext aud canals clear, TMs without erythema, bulging, fluid noted on right. No erythema, swelling, or exudate on post pharynx.  Tonsils not swollen or erythematous. Hearing normal.  Neck: Supple, thyroid normal.  Respiratory: Respiratory effort normal, BS equal bilaterally without rales, rhonchi, wheezing or stridor.  Cardio: RRR with no MRGs. Brisk peripheral pulses without edema.  Abdomen: Soft, + BS.  Non tender, no guarding, rebound, hernias, masses. Lymphatics: Positive submandibular adenopathy bilaterally Musculoskeletal: Full ROM, 5/5 strength, normal gait.  Skin: Warm, dry without rashes, lesions, ecchymosis.  Neuro: Cranial nerves intact. Normal muscle tone, no cerebellar symptoms. Sensation intact.  Psych: Awake and oriented X 3, normal affect, Insight and Judgment appropriate.     Alycia Rossetti, NP 11:01 AM [facility]

## 2022-06-17 NOTE — Progress Notes (Unsigned)
ANNUAL MEDICARE WELLNESS VISIT   Assessment:    Encounter for Medicare wWellness visit Due 1 year; declines colonoscopy , cologuard negative 07/2020  Aortic Atherosclerosis(HCC) per CT 2022 Control BP, weight, blood sugars and cholesterol  Abnormal Glucose Recent A1Cs at goal Discussed diet/exercise, weight management  Check A1C annually  OSTEOPENIA DEXA 01/2021 t score -2.0, cont vitamin D, calcium, weight bearing exerces   HYPERLIPIDEMIA  -continue medications, LDL goal <100, check lipids, decrease fatty foods, increase activity.  - TSH - CMP/GFR - Lipid panel   Allergy Avoid triggerd, OTC agent PRN, monitor  Vitamin D deficiency Continue vit D supplement for goal 60-100; check annually and PRN   Medication Mangaement Continued  History of breast cancer Continue MGM  Essential hypertension Recently fairly controlled without medicatoins Monitor blood pressure at home; call if consistently over 130/80 Continue DASH diet.   Reminder to go to the ER if any CP, SOB, nausea, dizziness, severe HA, changes vision/speech, left arm numbness and tingling and jaw pain. -     CBC with Differential/Platelet -     CMP/GFR -     TSH  Thyroid Cancer,Hx S/p resection remotely;  Continue thyroid replacement   HYPOTHYROIDISM Await results of TSH and will try to adjust dose so can take 1 pill daily, difficulty cutting pills in half -check TSH level, continue medications the same, reminded to take on an empty stomach 30-24mins before food.  - TSH  Gastroesophageal reflux disease, esophagitis presence not specified Fairly managed off of medications recently  Discussed diet, avoiding triggers and other lifestyle changes  Other irritable bowel syndrome If not on benefiber then add it, decrease stress,  if any worsening symptoms, blood in stool, AB pain, etc call office  COPD By imaging CXR 2010, no smoking/exposure history, age related Trelegy sample given Denies sx;  monitor     Future Appointments  Date Time Provider [place]  01/20/2023 10:00 AM Darrol Jump, NP [facility] None  06/10/2023 11:00 AM Alycia Rossetti, NP [facility] None     Subjective:   Haley Shah is a 82 y.o. female who presents for AWV and 3 month follow up. She has Hypothyroidism; Hyperlipidemia; GERD; IBS; Vertigo; Thyroid Cancer,Hx; History of left breast cancer; Other abnormal glucose (hx of prediabetes); Vitamin D deficiency; Medication management; Essential hypertension; COPD (chronic obstructive pulmonary disease); Primary osteoarthritis of both knees; B12 deficiency; and Osteopenia on their problem list.  She is married, 2 children, 5 grandchilren, 1 great granchild, all local. 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   Has history of left breast cancer in 2009, continues with annual mammograms, had negative in 01/2020.   She has COPD/hyperinflation by imaging on CXR since 2010; denies history of smoking, denies dyspnea, cough, secretions.   She has bil knee arthritis, has seen ortho for injections, declined surgery. Does have some pain, takes tylenol PRN if will be walking a lot but prefers to avoid medication, will use topicals occasional. She had injection in right knee that helped a lot.   She has benign essential tremor, mild, worse in AM, declines medication.   BMI is Body mass index is 23.87 kg/m., she has been working on diet, admits no intentional exercise due to knees, but very active in home and yard.  Pushes water, 64 fluid ounces most days Wt Readings from Last 3 Encounters:  06/18/22 122 lb 3.2 oz (55.4 kg)  06/10/22 125 lb (56.7 kg)  01/16/22 125 lb 3.2 oz (56.8 kg)  Her blood pressure has been controlled at home, today their BP is BP: 136/64  BP Readings from Last 3 Encounters:  06/18/22 136/64  06/10/22 138/64  01/16/22 (!) 155/90   She does not workout. She denies chest pain, shortness of breath, dizziness.      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 Avoids NSAIDs and pushing water getting 65 fluid ounces daily  Lab Results  Component Value Date   EGFR 67 01/16/2022    She is on cholesterol medication, rosuvastatin 40 mg tab 5 days a week.Her cholesterol is at goal. The cholesterol last visit was:   Lab Results  Component Value Date   CHOL 158 01/16/2022   HDL 58 01/16/2022   LDLCALC 83 01/16/2022   TRIG 81 01/16/2022   CHOLHDL 2.7 01/16/2022     Last A1C in the office was:  Lab Results  Component Value Date   HGBA1C 5.5 01/16/2022   Patient is on Vitamin D supplement. Lab Results  Component Value Date   VD25OH 22 01/16/2022   She is on thyroid medication following partial thyroidectomy for thyroid tumor. Her medication was not changed last visit, she is on 1/2 pill rest of the week and 1 pill M,W,F.  Patient denies nervousness and palpitations.  Lab Results  Component Value Date   TSH 1.19 01/16/2022   She reports did start on PO B12, not SL, not sure of levels.  Lab Results  Component Value Date   [id number] 06/08/2020     Medication Review Current Outpatient Medications on File Prior to Visit  Medication Sig Dispense Refill   aspirin 81 MG tablet Take 81 mg by mouth daily.     latanoprost (XALATAN) 0.005 % ophthalmic solution Place 1 drop into both eyes at bedtime.     levothyroxine (SYNTHROID) 100 MCG tablet Take  1 tablet  Daily  on an empty stomach with only water for 30 minutes & no Antacid meds, Calcium or Magnesium for 4 hours & avoid Biotin                                                          /                                 TAKE BY                                MOUTH                                   ONCE DAILY 90 tablet 3   rosuvastatin (CRESTOR) 40 MG tablet TAKE 1 TABLET BY MOUTH 5 DAYS PER WEEK OR AS DIRECTED (SKIP TUESDAY AND SATURDAY) FOR CHOLESTEROL 60 tablet 3   VITAMIN D, CHOLECALCIFEROL, PO Take 2,000 Int'l Units by mouth.     Wheat Dextrin (BENEFIBER PO)  Take by mouth.     azithromycin (ZITHROMAX) 250 MG tablet Take 2 tablets PO on day 1, then 1 tablet PO Q24H x 4 days (Patient not taking: Reported on 06/18/2022) 6 tablet 0   Cyanocobalamin (B-12 PO) Take by mouth daily. PRN (  Patient not taking: Reported on 01/16/2022)     dicyclomine (BENTYL) 20 MG tablet Take  1/2 to 1 tablet  3 to 4 x /day as needed for Abdominal cramping. (Patient not taking: Reported on 06/18/2022) 90 tablet 1   OVER THE COUNTER MEDICATION OTC Fiber Gummies  OR   Benefiber. (Patient not taking: Reported on 06/10/2022)     Pseudoephedrine HCl (SUDAFED PO) Take by mouth. (Patient not taking: Reported on 06/18/2022)     No current facility-administered medications on file prior to visit.    Current Problems (verified) Patient Active Problem List   Diagnosis Date Noted   Osteopenia 01/16/2021   B12 deficiency 01/18/2020   Primary osteoarthritis of both knees 04/08/2019   COPD (chronic obstructive pulmonary disease) 12/14/2017   Essential hypertension 10/27/2013   Medication management 04/23/2013   Other abnormal glucose (hx of prediabetes)    Vitamin D deficiency    History of left breast cancer 11/03/2012   Hypothyroidism 03/22/2008   Hyperlipidemia 03/22/2008   GERD 03/22/2008   IBS 03/22/2008   Vertigo 03/22/2008   Thyroid Cancer,Hx 03/22/2008    Screening Tests Immunization History  Administered Date(s) Administered   Fluad Quad(high Dose 65+) 12/30/2021   Influenza, High Dose Seasonal PF 01/05/2013, 01/05/2015, 11/27/2015, 12/20/2016, 12/15/2017, 12/17/2018, 01/17/2020, 01/01/2021   PFIZER(Purple Top)SARS-COV-2 Vaccination 04/25/2019, 05/19/2019, 03/13/2020   Pneumococcal Conjugate-13 03/02/2015   Pneumococcal Polysaccharide-23 03/18/2008   Td 03/18/2005   Tdap 12/01/2018    Health Maintenance  Topic Date Due   COVID-19 Vaccine (4 - 2023-24 season) 07/04/2022 (Originally 11/16/2021)   Zoster Vaccines- Shingrix (1 of 2) 09/17/2022 (Originally 03/02/1960)    INFLUENZA VACCINE  10/17/2022   MAMMOGRAM  03/05/2023   Medicare Annual Wellness (AWV)  06/18/2023   DTaP/Tdap/Td (3 - Td or Tdap) 11/30/2028   Pneumonia Vaccine 67+ Years old  Completed   DEXA SCAN  Completed   HPV VACCINES  Aged Out     Names of Other Physician/Practitioners you currently use: 1. [facility]- here for primary care 2. Dr. Katy Fitch, eye doctor, 12/2021  3.  Dr. Buelah Manis, dentist, last visit 04/2022 4. Dr. Danella Sensing, derm, last visit 2022, goes annually for full body checks  Patient Care Team: Unk Pinto, MD as PCP - General (Internal Medicine)   Allergies Allergies  Allergen Reactions   Naprosyn [Naproxen]     Raw mouth   Neomycin-Bacitracin Zn-Polymyx     REACTION: rash   Propoxyphene Hcl     REACTION: syncope   Darvon Other (See Comments)    Almost fainted when taking, over 50 years.    SURGICAL HISTORY She  has a past surgical history that includes Thyroid surgery (1986); Breast lumpectomy (2010); Colonoscopy; Knee arthroscopy with medial menisectomy (Left, 05/25/2014); Knee arthroscopy with lateral menisectomy (Left, 05/25/2014); Chondroplasty (Left, 05/25/2014); Rotator cuff repair (Right, 05/27/2018); Cataract extraction, bilateral (Bilateral); and Cardiac catheterization (2006). FAMILY HISTORY Her family history includes Cancer in her father; Colon cancer in her father; Kidney disease in her paternal grandmother; Ovarian cancer in her sister; Stroke in her father and paternal grandfather. SOCIAL HISTORY She  reports that she has never smoked. She has never used smokeless tobacco. She reports that she does not drink alcohol and does not use drugs.  MEDICARE WELLNESS OBJECTIVES: Physical activity: Current Exercise Habits: The patient does not participate in regular exercise at present, Exercise limited by: orthopedic condition(s) Cardiac risk factors: Cardiac Risk Factors include: advanced age (>47men, >90  women);dyslipidemia;hypertension;sedentary lifestyle Depression/mood screen:  06/18/2022   11:51 AM  Depression screen PHQ 2/9  Decreased Interest 0  Down, Depressed, Hopeless 0  PHQ - 2 Score 0    ADLs:     06/18/2022   11:51 AM  In your present state of health, do you have any difficulty performing the following activities:  Hearing? 0  Vision? 0  Difficulty concentrating or making decisions? 0  Walking or climbing stairs? 1  Dressing or bathing? 0  Doing errands, shopping? 0     Cognitive Testing  Alert? Yes  Normal Appearance?Yes  Oriented to person? Yes  Place? Yes   Time? Yes  Recall of three objects?  Yes  Can perform simple calculations? Yes  Displays appropriate judgment?Yes  Can read the correct time from a watch face?Yes  EOL planning: Does Patient Have a Medical Advance Directive?: Yes Type of Advance Directive: Healthcare Power of Attorney, Living will Does patient want to make changes to medical advance directive?: No - Patient declined Copy of [place] in Chart?: No - copy requested     Review of Systems  Constitutional:  Negative for malaise/fatigue and weight loss.  HENT:  Negative for hearing loss and tinnitus.   Eyes:  Negative for blurred vision and double vision.  Respiratory:  Negative for cough, sputum production, shortness of breath and wheezing.   Cardiovascular:  Negative for chest pain, palpitations, orthopnea, claudication, leg swelling and PND.  Gastrointestinal:  Negative for abdominal pain, blood in stool, constipation, diarrhea, heartburn, melena, nausea and vomiting.  Genitourinary: Negative.   Musculoskeletal:  Positive for joint pain (knees). Negative for falls and myalgias.  Skin:  Negative for rash.  Neurological:  Negative for dizziness, tingling, sensory change, weakness and headaches.  Endo/Heme/Allergies:  Negative for polydipsia.  Psychiatric/Behavioral: Negative.  Negative for depression, memory loss,  substance abuse and suicidal ideas. The patient is not nervous/anxious and does not have insomnia.   All other systems reviewed and are negative.    Objective:   Blood pressure 136/64, pulse 64, temperature (!) 97.5 F (36.4 C), height 5' (1.524 m), weight 122 lb 3.2 oz (55.4 kg), SpO2 97 %. Body mass index is 23.87 kg/m.  General appearance: alert, no distress, WD/WN,  female HEENT: normocephalic, sclerae anicteric, TMs pearly, nares patent, no discharge or erythema, pharynx normal Oral cavity: MMM, no lesions Neck: supple, no lymphadenopathy, no thyromegaly, no masses Heart: RRR, normal S1, S2, no murmurs Lungs: CTA bilaterally, no wheezes, rhonchi, or rales Abdomen: +bs, soft, non tender, non distended, no masses, no hepatomegaly, no splenomegaly Musculoskeletal: nontender, no swelling, no obvious deformity. Patient is able to ambulate well.  Extremities: no edema, no cyanosis, no clubbing Pulses: 2+ symmetric, upper and lower extremities, normal cap refill Neurological: alert, oriented x 3, CN2-12 intact, strength normal upper extremities and lower extremities, sensation normal throughout, DTRs 2+ throughout, no cerebellar signs, gait normal Psychiatric: normal affect, behavior normal, pleasant  Skin: warm, dry, intact; no concerning rash, lesions;    Medicare Attestation I have personally reviewed: The patient's medical and social history Their use of alcohol, tobacco or illicit drugs Their current medications and supplements The patient's functional ability including ADLs,fall risks, home safety risks, cognitive, and hearing and visual impairment Diet and physical activities Evidence for depression or mood disorders  The patient's weight, height, BMI, and visual acuity have been recorded in the chart.  I have made referrals, counseling, and provided education to the patient based on review of the above and I have provided  the patient with a written personalized care plan for  preventive services.      Alycia Rossetti, NP   06/18/2022

## 2022-06-18 ENCOUNTER — Encounter: Payer: Self-pay | Admitting: Nurse Practitioner

## 2022-06-18 ENCOUNTER — Ambulatory Visit (INDEPENDENT_AMBULATORY_CARE_PROVIDER_SITE_OTHER): Payer: PPO | Admitting: Nurse Practitioner

## 2022-06-18 VITALS — BP 136/64 | HR 64 | Temp 97.5°F | Ht 60.0 in | Wt 122.2 lb

## 2022-06-18 DIAGNOSIS — E782 Mixed hyperlipidemia: Secondary | ICD-10-CM | POA: Diagnosis not present

## 2022-06-18 DIAGNOSIS — I10 Essential (primary) hypertension: Secondary | ICD-10-CM

## 2022-06-18 DIAGNOSIS — R6889 Other general symptoms and signs: Secondary | ICD-10-CM

## 2022-06-18 DIAGNOSIS — Z79899 Other long term (current) drug therapy: Secondary | ICD-10-CM | POA: Diagnosis not present

## 2022-06-18 DIAGNOSIS — J302 Other seasonal allergic rhinitis: Secondary | ICD-10-CM

## 2022-06-18 DIAGNOSIS — Z0001 Encounter for general adult medical examination with abnormal findings: Secondary | ICD-10-CM | POA: Diagnosis not present

## 2022-06-18 DIAGNOSIS — E559 Vitamin D deficiency, unspecified: Secondary | ICD-10-CM

## 2022-06-18 DIAGNOSIS — Z85850 Personal history of malignant neoplasm of thyroid: Secondary | ICD-10-CM | POA: Diagnosis not present

## 2022-06-18 DIAGNOSIS — R7309 Other abnormal glucose: Secondary | ICD-10-CM | POA: Diagnosis not present

## 2022-06-18 DIAGNOSIS — I700 Atherosclerosis of aorta: Secondary | ICD-10-CM

## 2022-06-18 DIAGNOSIS — K219 Gastro-esophageal reflux disease without esophagitis: Secondary | ICD-10-CM

## 2022-06-18 DIAGNOSIS — E039 Hypothyroidism, unspecified: Secondary | ICD-10-CM

## 2022-06-18 DIAGNOSIS — K588 Other irritable bowel syndrome: Secondary | ICD-10-CM

## 2022-06-18 DIAGNOSIS — Z0000 Encounter for general adult medical examination without abnormal findings: Secondary | ICD-10-CM

## 2022-06-18 DIAGNOSIS — Z853 Personal history of malignant neoplasm of breast: Secondary | ICD-10-CM | POA: Diagnosis not present

## 2022-06-18 DIAGNOSIS — J449 Chronic obstructive pulmonary disease, unspecified: Secondary | ICD-10-CM

## 2022-06-18 NOTE — Patient Instructions (Signed)

## 2022-06-19 ENCOUNTER — Other Ambulatory Visit: Payer: Self-pay | Admitting: Nurse Practitioner

## 2022-06-19 DIAGNOSIS — E039 Hypothyroidism, unspecified: Secondary | ICD-10-CM

## 2022-06-19 LAB — LIPID PANEL
Cholesterol: 146 mg/dL (ref <200)
HDL: 59 mg/dL (ref >=50)
LDL Cholesterol (Calc): 68 mg/dL (calc)
Non-HDL Cholesterol (Calc): 87 mg/dL (calc) (ref <130)
Total CHOL/HDL Ratio: 2.5 (calc) (ref <5.0)
Triglycerides: 108 mg/dL (ref <150)

## 2022-06-19 LAB — CBC WITH DIFFERENTIAL/PLATELET
Absolute Monocytes: 843 cells/uL (ref 200–950)
Basophils Absolute: 77 cells/uL (ref 0–200)
Basophils Relative: 0.9 %
Eosinophils Absolute: 138 cells/uL (ref 15–500)
Eosinophils Relative: 1.6 %
HCT: 40.3 % (ref 35.0–45.0)
Hemoglobin: 13.1 g/dL (ref 11.7–15.5)
Lymphs Abs: 2072.6 cells/uL (ref 850–3900)
MCH: 29.6 pg (ref 27.0–33.0)
MCHC: 32.5 g/dL (ref 32.0–36.0)
MCV: 91 fL (ref 80.0–100.0)
MPV: 9.7 fL (ref 7.5–12.5)
Monocytes Relative: 9.8 %
Neutro Abs: 5470 cells/uL (ref 1500–7800)
Neutrophils Relative %: 63.6 %
Platelets: 252 10*3/uL (ref 140–400)
RBC: 4.43 10*6/uL (ref 3.80–5.10)
RDW: 12.9 % (ref 11.0–15.0)
Total Lymphocyte: 24.1 %
WBC: 8.6 10*3/uL (ref 3.8–10.8)

## 2022-06-19 LAB — COMPLETE METABOLIC PANEL WITH GFR
AG Ratio: 2 (calc) (ref 1.0–2.5)
ALT: 10 U/L (ref 6–29)
AST: 16 U/L (ref 10–35)
Albumin: 4.5 g/dL (ref 3.6–5.1)
Alkaline phosphatase (APISO): 64 U/L (ref 37–153)
BUN: 14 mg/dL (ref 7–25)
CO2: 26 mmol/L (ref 20–32)
Calcium: 9.4 mg/dL (ref 8.6–10.4)
Chloride: 106 mmol/L (ref 98–110)
Creat: 0.88 mg/dL (ref 0.60–0.95)
Globulin: 2.3 g/dL (calc) (ref 1.9–3.7)
Glucose, Bld: 83 mg/dL (ref 65–99)
Potassium: 4.6 mmol/L (ref 3.5–5.3)
Sodium: 142 mmol/L (ref 135–146)
Total Bilirubin: 0.4 mg/dL (ref 0.2–1.2)
Total Protein: 6.8 g/dL (ref 6.1–8.1)
eGFR: 65 mL/min/{1.73_m2} (ref >=60)

## 2022-06-19 LAB — TSH: TSH: 3.28 mIU/L (ref 0.40–4.50)

## 2022-06-19 MED ORDER — LEVOTHYROXINE SODIUM 75 MCG PO TABS: 75.0000 ug | ORAL_TABLET | Freq: Every day | ORAL | 2 refills | Status: DC

## 2022-06-24 ENCOUNTER — Ambulatory Visit

## 2022-06-25 ENCOUNTER — Other Ambulatory Visit: Payer: Self-pay | Admitting: Nurse Practitioner

## 2022-06-25 DIAGNOSIS — M170 Bilateral primary osteoarthritis of knee: Secondary | ICD-10-CM | POA: Diagnosis not present

## 2022-06-25 DIAGNOSIS — M1711 Unilateral primary osteoarthritis, right knee: Secondary | ICD-10-CM | POA: Diagnosis not present

## 2022-07-08 NOTE — Progress Notes (Signed)
Assessment and Plan:  There are no diagnoses linked to this encounter.    Further disposition pending results of labs. Discussed med's effects and SE's.   Over 30 minutes of exam, counseling, chart review, and critical decision making was performed.   Future Appointments  Date Time Provider Department Center  07/09/2022  4:00 PM Raynelle Dick, NP [facility] None  01/20/2023 10:00 AM Adela Glimpse, NP [facility] None  06/10/2023 11:00 AM Raynelle Dick, NP [facility] None    ------------------------------------------------------------------------------------------------------------------   HPI There were no vitals taken for this visit. 82 y.o.female presents for  Past Medical History:  Diagnosis Date   Allergy    Cancer 2010   Left Breast   Dyspnea    chronic-no cardiac reason   GERD (gastroesophageal reflux disease)    Glaucoma    History of thyroid cancer    Hyperlipidemia    Hypertension    no meds   IBS (irritable bowel syndrome)    Pre-diabetes    Sciatica    RLE   Thyroid disease    Hypo   Vitamin D deficiency    Wears glasses      Allergies  Allergen Reactions   Naprosyn [Naproxen]     Raw mouth   Neomycin-Bacitracin Zn-Polymyx     REACTION: rash   Propoxyphene Hcl     REACTION: syncope   Darvon Other (See Comments)    Almost fainted when taking, over 50 years.    Current Outpatient Medications on File Prior to Visit  Medication Sig   aspirin 81 MG tablet Take 81 mg by mouth daily.   Cyanocobalamin (B-12 PO) Take by mouth daily. PRN (Patient not taking: Reported on 01/16/2022)   dicyclomine (BENTYL) 20 MG tablet Take  1/2 to 1 tablet  3 to 4 x /day as needed for Abdominal cramping. (Patient not taking: Reported on 06/18/2022)   latanoprost (XALATAN) 0.005 % ophthalmic solution Place 1 drop into both eyes at bedtime.   levothyroxine (SYNTHROID) 75 MCG tablet Take 1 tablet (75 mcg total) by mouth daily before breakfast.   OVER THE COUNTER  MEDICATION OTC Fiber Gummies  OR   Benefiber. (Patient not taking: Reported on 06/10/2022)   rosuvastatin (CRESTOR) 40 MG tablet TAKE 1 TABLET BY MOUTH 5 DAYS PER WEEK OR AS DIRECTED (SKIP TUESDAY AND SATURDAY) FOR CHOLESTEROL   VITAMIN D, CHOLECALCIFEROL, PO Take 2,000 Int'l Units by mouth.   Wheat Dextrin (BENEFIBER PO) Take by mouth.   No current facility-administered medications on file prior to visit.    ROS: all negative except above.   Physical Exam:  There were no vitals taken for this visit.  General Appearance: Well nourished, in no apparent distress. Eyes: PERRLA, EOMs, conjunctiva no swelling or erythema Sinuses: No Frontal/maxillary tenderness ENT/Mouth: Ext aud canals clear, TMs without erythema, bulging. No erythema, swelling, or exudate on post pharynx.  Tonsils not swollen or erythematous. Hearing normal.  Neck: Supple, thyroid normal.  Respiratory: Respiratory effort normal, BS equal bilaterally without rales, rhonchi, wheezing or stridor.  Cardio: RRR with no MRGs. Brisk peripheral pulses without edema.  Abdomen: Soft, + BS.  Non tender, no guarding, rebound, hernias, masses. Lymphatics: Non tender without lymphadenopathy.  Musculoskeletal: Full ROM, 5/5 strength, normal gait.  Skin: Warm, dry without rashes, lesions, ecchymosis.  Neuro: Cranial nerves intact. Normal muscle tone, no cerebellar symptoms. Sensation intact.  Psych: Awake and oriented X 3, normal affect, Insight and Judgment appropriate.     Raynelle Dick, NP 10:19  AM [facility]

## 2022-07-09 ENCOUNTER — Encounter: Payer: Self-pay | Admitting: Nurse Practitioner

## 2022-07-09 ENCOUNTER — Ambulatory Visit (INDEPENDENT_AMBULATORY_CARE_PROVIDER_SITE_OTHER): Payer: PPO | Admitting: Nurse Practitioner

## 2022-07-09 VITALS — BP 128/62 | HR 61 | Temp 97.7°F | Ht 60.0 in | Wt 121.8 lb

## 2022-07-09 DIAGNOSIS — J069 Acute upper respiratory infection, unspecified: Secondary | ICD-10-CM

## 2022-07-09 DIAGNOSIS — I10 Essential (primary) hypertension: Secondary | ICD-10-CM

## 2022-07-09 MED ORDER — AZITHROMYCIN 250 MG PO TABS
ORAL_TABLET | ORAL | 1 refills | Status: DC
Start: 2022-07-09 — End: 2022-09-30

## 2022-07-09 MED ORDER — PROMETHAZINE-DM 6.25-15 MG/5ML PO SYRP
5.0000 mL | ORAL_SOLUTION | Freq: Four times a day (QID) | ORAL | 1 refills | Status: DC | PRN
Start: 2022-07-09 — End: 2022-09-30

## 2022-07-09 MED ORDER — DEXAMETHASONE 1 MG PO TABS
ORAL_TABLET | ORAL | 0 refills | Status: DC
Start: 2022-07-09 — End: 2022-09-30

## 2022-07-09 NOTE — Patient Instructions (Addendum)
Take 1 dexamethasone daily for 5-7 days  Upper Respiratory Infection, Adult An upper respiratory infection (URI) affects the nose, throat, and upper airways that lead to the lungs. The most common type of URI is often called the common cold. URIs usually get better on their own, without medical treatment. What are the causes? A URI is caused by a germ (virus). You may catch these germs by: Breathing in droplets from an infected person's cough or sneeze. Touching something that has the germ on it (is contaminated) and then touching your mouth, nose, or eyes. What increases the risk? You are more likely to get a URI if: You are very young or very old. You have close contact with others, such as at work, school, or a health care facility. You smoke. You have long-term (chronic) heart or lung disease. You have a weakened disease-fighting system (immune system). You have nasal allergies or asthma. You have a lot of stress. You have poor nutrition. What are the signs or symptoms? Runny or stuffy (congested) nose. Cough. Sneezing. Sore throat. Headache. Feeling tired (fatigue). Fever. Not wanting to eat as much as usual. Pain in your forehead, behind your eyes, and over your cheekbones (sinus pain). Muscle aches. Redness or irritation of the eyes. Pressure in the ears or face. How is this treated? URIs usually get better on their own within 7-10 days. Medicines cannot cure URIs, but your doctor may recommend certain medicines to help relieve symptoms, such as: Over-the-counter cold medicines. Medicines to reduce coughing (cough suppressants). Coughing is a type of defense against infection that helps to clear the nose, throat, windpipe, and lungs (respiratory system). Take these medicines only as told by your doctor. Medicines to lower your fever. Follow these instructions at home: Activity Rest as needed. If you have a fever, stay home from work or school until your fever is gone,  or until your doctor says you may return to work or school. You should stay home until you cannot spread the infection anymore (you are not contagious). Your doctor may have you wear a face mask so you have less risk of spreading the infection. Relieving symptoms Rinse your mouth often with salt water. To make salt water, dissolve -1 tsp (3-6 g) of salt in 1 cup (237 mL) of warm water. Use a cool-mist humidifier to add moisture to the air. This can help you breathe more easily. Eating and drinking  Drink enough fluid to keep your pee (urine) pale yellow. Eat soups and other clear broths. General instructions  Take over-the-counter and prescription medicines only as told by your doctor. Do not smoke or use any products that contain nicotine or tobacco. If you need help quitting, ask your doctor. Avoid being where people are smoking (avoid secondhand smoke). Stay up to date on all your shots (immunizations), and get the flu shot every year. Keep all follow-up visits. How to prevent the spread of infection to others  Wash your hands with soap and water for at least 20 seconds. If you cannot use soap and water, use hand sanitizer. Avoid touching your mouth, face, eyes, or nose. Cough or sneeze into a tissue or your sleeve or elbow. Do not cough or sneeze into your hand or into the air. Contact a doctor if: You are getting worse, not better. You have any of these: A fever or chills. Brown or red mucus in your nose. Yellow or brown fluid (discharge)coming from your nose. Pain in your face, especially when you bend  forward. Swollen neck glands. Pain when you swallow. White areas in the back of your throat. Get help right away if: You have shortness of breath that gets worse. You have very bad or constant: Headache. Ear pain. Pain in your forehead, behind your eyes, and over your cheekbones (sinus pain). Chest pain. You have long-lasting (chronic) lung disease along with any of  these: Making high-pitched whistling sounds when you breathe, most often when you breathe out (wheezing). Long-lasting cough (more than 14 days). Coughing up blood. A change in your usual mucus. You have a stiff neck. You have changes in your: Vision. Hearing. Thinking. Mood. These symptoms may be an emergency. Get help right away. Call 911. Do not wait to see if the symptoms will go away. Do not drive yourself to the hospital. Summary An upper respiratory infection (URI) is caused by a germ (virus). The most common type of URI is often called the common cold. URIs usually get better within 7-10 days. Take over-the-counter and prescription medicines only as told by your doctor. This information is not intended to replace advice given to you by your health care provider. Make sure you discuss any questions you have with your health care provider. Document Revised: 10/04/2020 Document Reviewed: 10/04/2020 Elsevier Patient Education  [place].

## 2022-07-23 DIAGNOSIS — H401131 Primary open-angle glaucoma, bilateral, mild stage: Secondary | ICD-10-CM | POA: Diagnosis not present

## 2022-07-23 DIAGNOSIS — Z961 Presence of intraocular lens: Secondary | ICD-10-CM | POA: Diagnosis not present

## 2022-08-21 DIAGNOSIS — E039 Hypothyroidism, unspecified: Secondary | ICD-10-CM | POA: Diagnosis not present

## 2022-08-21 DIAGNOSIS — H409 Unspecified glaucoma: Secondary | ICD-10-CM | POA: Diagnosis not present

## 2022-08-21 DIAGNOSIS — G8929 Other chronic pain: Secondary | ICD-10-CM | POA: Diagnosis not present

## 2022-08-21 DIAGNOSIS — R42 Dizziness and giddiness: Secondary | ICD-10-CM | POA: Diagnosis not present

## 2022-08-21 DIAGNOSIS — Z7982 Long term (current) use of aspirin: Secondary | ICD-10-CM | POA: Diagnosis not present

## 2022-08-21 DIAGNOSIS — E785 Hyperlipidemia, unspecified: Secondary | ICD-10-CM | POA: Diagnosis not present

## 2022-08-21 DIAGNOSIS — J449 Chronic obstructive pulmonary disease, unspecified: Secondary | ICD-10-CM | POA: Diagnosis not present

## 2022-08-21 DIAGNOSIS — J309 Allergic rhinitis, unspecified: Secondary | ICD-10-CM | POA: Diagnosis not present

## 2022-08-21 DIAGNOSIS — K219 Gastro-esophageal reflux disease without esophagitis: Secondary | ICD-10-CM | POA: Diagnosis not present

## 2022-08-21 DIAGNOSIS — I10 Essential (primary) hypertension: Secondary | ICD-10-CM | POA: Diagnosis not present

## 2022-09-26 ENCOUNTER — Encounter: Payer: Self-pay | Admitting: Nurse Practitioner

## 2022-09-26 DIAGNOSIS — M1711 Unilateral primary osteoarthritis, right knee: Secondary | ICD-10-CM | POA: Diagnosis not present

## 2022-09-30 ENCOUNTER — Ambulatory Visit (INDEPENDENT_AMBULATORY_CARE_PROVIDER_SITE_OTHER): Payer: PPO | Admitting: Nurse Practitioner

## 2022-09-30 ENCOUNTER — Encounter: Payer: Self-pay | Admitting: Nurse Practitioner

## 2022-09-30 VITALS — BP 150/70 | HR 60 | Temp 97.6°F | Ht 60.0 in | Wt 120.0 lb

## 2022-09-30 DIAGNOSIS — H9192 Unspecified hearing loss, left ear: Secondary | ICD-10-CM | POA: Diagnosis not present

## 2022-09-30 DIAGNOSIS — I10 Essential (primary) hypertension: Secondary | ICD-10-CM

## 2022-09-30 DIAGNOSIS — H6123 Impacted cerumen, bilateral: Secondary | ICD-10-CM | POA: Diagnosis not present

## 2022-09-30 DIAGNOSIS — J302 Other seasonal allergic rhinitis: Secondary | ICD-10-CM

## 2022-09-30 NOTE — Progress Notes (Signed)
Assessment and Plan:  ZAREA DIESING was seen today for an episodic visit.  Diagnoses and all order for this visit:  1. Bilateral impacted cerumen Bilateral ear(s) irrigated with lukewarm water, hydrogen peroxide. Large piece of brown cerumen x4 extracted with ear curette. TM:  WNL.  Pt tolerated procedure well.    - Ear Lavage  2. Decreased hearing of left ear Continue to monitor If worsening symptoms contact office for referral to audiologist  3. Seasonal allergies Continue Zyrtec OTC Stay well hydrated Avoid triggers  4. Essential hypertension Elevated in clinic - asymptomatic  Discussed DASH (Dietary Approaches to Stop Hypertension) DASH diet is lower in sodium than a typical American diet. Cut back on foods that are high in saturated fat, cholesterol, and trans fats. Eat more whole-grain foods, fish, poultry, and nuts Remain active and exercise as tolerated daily.  Monitor BP at home-Call if greater than 130/80.  Check CMP/CBC    Notify office for further evaluation and treatment, questions or concerns if s/s fail to improve. The risks and benefits of my recommendations, as well as other treatment options were discussed with the patient today. Questions were answered.  Further disposition pending results of labs. Discussed med's effects and SE's.    Over 20 minutes of exam, counseling, chart review, and critical decision making was performed.   Future Appointments  Date Time Provider Department Center  01/20/2023 10:00 AM Adela Glimpse, NP [facility] None  06/10/2023 11:00 AM Raynelle Dick, NP [facility] None    ------------------------------------------------------------------------------------------------------------------   HPI BP (!) 150/70   Pulse 60   Temp 97.6 F (36.4 C)   Ht 5' (1.524 m)   Wt 120 lb (54.4 kg)   SpO2 98%   BMI 23.44 kg/m   82 y.o.female presents for evaluation of decreased hearing onset 3 days ago.  States that she was  unable to hear the ceiling fan running in her bedroom on the left side of her ear.  Also noticed muffled sounds when using phone.  She has had a hx of bilateral cerumen impaction.  Denies any recent travel by air or underwater adventures.   Past Medical History:  Diagnosis Date   Allergy    Cancer (HCC) 2010   Left Breast   Dyspnea    chronic-no cardiac reason   GERD (gastroesophageal reflux disease)    Glaucoma    History of thyroid cancer    Hyperlipidemia    Hypertension    no meds   IBS (irritable bowel syndrome)    Pre-diabetes    Sciatica    RLE   Thyroid disease    Hypo   Vitamin D deficiency    Wears glasses      Allergies  Allergen Reactions   Naprosyn [Naproxen]     Raw mouth   Neomycin-Bacitracin Zn-Polymyx     REACTION: rash   Propoxyphene Hcl     REACTION: syncope   Darvon Other (See Comments)    Almost fainted when taking, over 50 years.    Current Outpatient Medications on File Prior to Visit  Medication Sig   aspirin 81 MG tablet Take 81 mg by mouth daily.   cetirizine (ZYRTEC ALLERGY) 10 MG tablet Take 10 mg by mouth daily.   DM-APAP-CPM (CORICIDIN HBP PO) Take by mouth.   latanoprost (XALATAN) 0.005 % ophthalmic solution Place 1 drop into both eyes at bedtime.   levothyroxine (SYNTHROID) 75 MCG tablet Take 1 tablet (75 mcg total) by mouth daily before breakfast.  rosuvastatin (CRESTOR) 40 MG tablet TAKE 1 TABLET BY MOUTH 5 DAYS PER WEEK OR AS DIRECTED (SKIP TUESDAY AND SATURDAY) FOR CHOLESTEROL   VITAMIN D, CHOLECALCIFEROL, PO Take 2,000 Int'l Units by mouth.   Wheat Dextrin (BENEFIBER PO) Take by mouth.   azithromycin (ZITHROMAX) 250 MG tablet Take 2 tablets (500 mg) on  Day 1,  followed by 1 tablet (250 mg) once daily on Days 2 through 5.   dexamethasone (DECADRON) 1 MG tablet Take 3 tabs for 3 days, 2 tabs for 3 days 1 tab for 5 days. Take with food.   guaiFENesin (MUCINEX PO) Take by mouth. (Patient not taking: Reported on 09/30/2022)    promethazine-dextromethorphan (PROMETHAZINE-DM) 6.25-15 MG/5ML syrup Take 5 mLs by mouth 4 (four) times daily as needed for cough.   No current facility-administered medications on file prior to visit.    ROS: all negative except what is noted in the HPI.   Physical Exam:  BP (!) 150/70   Pulse 60   Temp 97.6 F (36.4 C)   Ht 5' (1.524 m)   Wt 120 lb (54.4 kg)   SpO2 98%   BMI 23.44 kg/m   General Appearance: NAD.  Awake, conversant and cooperative. Eyes: PERRLA, EOMs intact.  Sclera white.  Conjunctiva without erythema. Sinuses: No frontal/maxillary tenderness.  No nasal discharge. Nares patent.  ENT/Mouth: Bilateral auditory canals with impacted cerumen impaction.  Bilateral TMs UTA. Without erythema or bulging. Hearing intact.  Posterior pharynx without swelling or exudate.  Tonsils without swelling or erythema.  Neck: Supple.  No masses, nodules or thyromegaly. Respiratory: Effort is regular with non-labored breathing. Breath sounds are equal bilaterally without rales, rhonchi, wheezing or stridor.  Cardio: RRR with no MRGs. Brisk peripheral pulses without edema.  Abdomen: Active BS in all four quadrants.  Soft and non-tender without guarding, rebound tenderness, hernias or masses. Lymphatics: Non tender without lymphadenopathy.  Musculoskeletal: Full ROM, 5/5 strength, normal ambulation.  No clubbing or cyanosis. Skin: Appropriate color for ethnicity. Warm without rashes, lesions, ecchymosis, ulcers.  Neuro: CN II-XII grossly normal. Normal muscle tone without cerebellar symptoms and intact sensation.   Psych: AO X 3,  appropriate mood and affect, insight and judgment.     Adela Glimpse, NP 12:01 PM [facility]

## 2022-09-30 NOTE — Patient Instructions (Signed)

## 2022-10-01 ENCOUNTER — Encounter: Payer: Self-pay | Admitting: Nurse Practitioner

## 2022-10-10 ENCOUNTER — Encounter: Payer: Self-pay | Admitting: Nurse Practitioner

## 2022-11-12 ENCOUNTER — Ambulatory Visit: Payer: PPO | Admitting: Nurse Practitioner

## 2022-11-12 ENCOUNTER — Ambulatory Visit: Admission: RE | Admit: 2022-11-12 | Payer: PPO | Source: Ambulatory Visit

## 2022-11-12 ENCOUNTER — Encounter: Payer: Self-pay | Admitting: Nurse Practitioner

## 2022-11-12 VITALS — BP 138/76 | HR 63 | Temp 97.7°F | Ht 60.0 in | Wt 124.2 lb

## 2022-11-12 DIAGNOSIS — R58 Hemorrhage, not elsewhere classified: Secondary | ICD-10-CM

## 2022-11-12 DIAGNOSIS — M25552 Pain in left hip: Secondary | ICD-10-CM

## 2022-11-12 DIAGNOSIS — M8580 Other specified disorders of bone density and structure, unspecified site: Secondary | ICD-10-CM | POA: Diagnosis not present

## 2022-11-12 DIAGNOSIS — W19XXXA Unspecified fall, initial encounter: Secondary | ICD-10-CM | POA: Diagnosis not present

## 2022-11-12 DIAGNOSIS — R600 Localized edema: Secondary | ICD-10-CM

## 2022-11-12 NOTE — Progress Notes (Signed)
Assessment and Plan:  Haley Shah was seen today for an episodic visit.  Diagnoses and all order for this visit:  Fall, initial encounter Discussed fall safety.  - DG HIP UNILAT W OR W/O PELVIS 2-3 VIEWS LEFT; Future  Left hip pain/ecchymosis/edema RICE Method Tylenol as needed for pain Further treatment considered once x-ray confirmed.  - DG HIP UNILAT W OR W/O PELVIS 2-3 VIEWS LEFT; Future  Osteopenia 01/2021 DEXA T-Score -2.0 Pursue a combination of weight-bearing exercises and strength training. Advised on fall prevention measures including proper lighting in all rooms, removal of area rugs and floor clutter, use of walking devices as deemed appropriate, avoidance of uneven walking surfaces. Smoking cessation and moderate alcohol consumption if applicable Consume 800 to 1000 IU of vitamin D daily with a goal vitamin D serum value of 30 ng/mL or higher. Aim for 1000 to 1200 mg of elemental calcium daily through supplements and/or dietary sources.   Notify office for further evaluation and treatment, questions or concerns if s/s fail to improve. The risks and benefits of my recommendations, as well as other treatment options were discussed with the patient today. Questions were answered.  Further disposition pending results of labs. Discussed med's effects and SE's.    Over 20 minutes of exam, counseling, chart review, and critical decision making was performed.   Future Appointments  Date Time Provider Department Center  01/20/2023 10:00 AM Adela Glimpse, NP [facility] None  06/10/2023 11:00 AM Raynelle Dick, NP [facility] None    ------------------------------------------------------------------------------------------------------------------   HPI BP 138/76   Pulse 63   Temp 97.7 F (36.5 C)   Ht 5' (1.524 m)   Wt 124 lb 3.2 oz (56.3 kg)   SpO2 99%   BMI 24.26 kg/m   82 y.o.female presents for evaluation of left hip pain, bruising and edema after  sustaining a fall x1 week ago.  States that she was helping to move a refrigerator off of a trailer when she fell backwards landing on her left hit and buttocks.  She states hitting the side of a step.  She is able to walk and has range of motion but pain has continued over a week and is not subsiding.  She is unable to lay on the left side without pain.  She is taking OTC Tylenol/Arthritis for pain.  She has a hx of osteopenia last DEXA 01/2021 T Score -2.0.  Past Medical History:  Diagnosis Date   Allergy    Cancer (HCC) 2010   Left Breast   Dyspnea    chronic-no cardiac reason   GERD (gastroesophageal reflux disease)    Glaucoma    History of thyroid cancer    Hyperlipidemia    Hypertension    no meds   IBS (irritable bowel syndrome)    Pre-diabetes    Sciatica    RLE   Thyroid disease    Hypo   Vitamin D deficiency    Wears glasses      Allergies  Allergen Reactions   Naprosyn [Naproxen]     Raw mouth   Neomycin-Bacitracin Zn-Polymyx     REACTION: rash   Propoxyphene Hcl     REACTION: syncope   Darvon Other (See Comments)    Almost fainted when taking, over 50 years.    Current Outpatient Medications on File Prior to Visit  Medication Sig   aspirin 81 MG tablet Take 81 mg by mouth daily.   cetirizine (ZYRTEC ALLERGY) 10 MG tablet Take 10 mg  by mouth daily.   DM-APAP-CPM (CORICIDIN HBP PO) Take by mouth.   guaiFENesin (MUCINEX PO) Take by mouth.   latanoprost (XALATAN) 0.005 % ophthalmic solution Place 1 drop into both eyes at bedtime.   levothyroxine (SYNTHROID) 75 MCG tablet Take 1 tablet (75 mcg total) by mouth daily before breakfast.   rosuvastatin (CRESTOR) 40 MG tablet TAKE 1 TABLET BY MOUTH 5 DAYS PER WEEK OR AS DIRECTED (SKIP TUESDAY AND SATURDAY) FOR CHOLESTEROL   VITAMIN D, CHOLECALCIFEROL, PO Take 2,000 Int'l Units by mouth.   Wheat Dextrin (BENEFIBER PO) Take by mouth.   No current facility-administered medications on file prior to visit.    ROS: all  negative except what is noted in the HPI.   Physical Exam:  BP 138/76   Pulse 63   Temp 97.7 F (36.5 C)   Ht 5' (1.524 m)   Wt 124 lb 3.2 oz (56.3 kg)   SpO2 99%   BMI 24.26 kg/m   General Appearance: NAD.  Awake, conversant and cooperative. Eyes: PERRLA, EOMs intact.  Sclera white.  Conjunctiva without erythema. Sinuses: No frontal/maxillary tenderness.  No nasal discharge. Nares patent.  ENT/Mouth: Ext aud canals clear.  Bilateral TMs w/DOL and without erythema or bulging. Hearing intact.  Posterior pharynx without swelling or exudate.  Tonsils without swelling or erythema.  Neck: Supple.  No masses, nodules or thyromegaly. Respiratory: Effort is regular with non-labored breathing. Breath sounds are equal bilaterally without rales, rhonchi, wheezing or stridor.  Cardio: RRR with no MRGs. Brisk peripheral pulses without edema.  Abdomen: Active BS in all four quadrants.  Soft and non-tender without guarding, rebound tenderness, hernias or masses. Lymphatics: Non tender without lymphadenopathy.  Musculoskeletal: Large purple bruise from mid buttocks to mid left thigh.  Tender to palpation along left greater trochanter.  Pain with extension of LLE.  5/5 strength, favors left side when walking. Skin: Appropriate color for ethnicity. Warm without rashes, lesions, ecchymosis, ulcers.  Neuro: CN II-XII grossly normal. Normal muscle tone without cerebellar symptoms and intact sensation.   Psych: AO X 3,  appropriate mood and affect, insight and judgment.     Adela Glimpse, NP 11:05 AM [facility]

## 2022-11-12 NOTE — Patient Instructions (Signed)
Fall Prevention in the Home, Adult Falls can cause injuries and can happen to people of all ages. There are many things you can do to make your home safer and to help prevent falls. What actions can I take to prevent falls? General information Use good lighting in all rooms. Make sure to: Replace any light bulbs that burn out. Turn on the lights in dark areas and use night-lights. Keep items that you use often in easy-to-reach places. Lower the shelves around your home if needed. Move furniture so that there are clear paths around it. Do not use throw rugs or other things on the floor that can make you trip. If any of your floors are uneven, fix them. Add color or contrast paint or tape to clearly mark and help you see: Grab bars or handrails. First and last steps of staircases. Where the edge of each step is. If you use a ladder or stepladder: Make sure that it is fully opened. Do not climb a closed ladder. Make sure the sides of the ladder are locked in place. Have someone hold the ladder while you use it. Know where your pets are as you move through your home. What can I do in the bathroom?     Keep the floor dry. Clean up any water on the floor right away. Remove soap buildup in the bathtub or shower. Buildup makes bathtubs and showers slippery. Use non-skid mats or decals on the floor of the bathtub or shower. Attach bath mats securely with double-sided, non-slip rug tape. If you need to sit down in the shower, use a non-slip stool. Install grab bars by the toilet and in the bathtub and shower. Do not use towel bars as grab bars. What can I do in the bedroom? Make sure that you have a light by your bed that is easy to reach. Do not use any sheets or blankets on your bed that hang to the floor. Have a firm chair or bench with side arms that you can use for support when you get dressed. What can I do in the kitchen? Clean up any spills right away. If you need to reach something  above you, use a step stool with a grab bar. Keep electrical cords out of the way. Do not use floor polish or wax that makes floors slippery. What can I do with my stairs? Do not leave anything on the stairs. Make sure that you have a light switch at the top and the bottom of the stairs. Make sure that there are handrails on both sides of the stairs. Fix handrails that are broken or loose. Install non-slip stair treads on all your stairs if they do not have carpet. Avoid having throw rugs at the top or bottom of the stairs. Choose a carpet that does not hide the edge of the steps on the stairs. Make sure that the carpet is firmly attached to the stairs. Fix carpet that is loose or worn. What can I do on the outside of my home? Use bright outdoor lighting. Fix the edges of walkways and driveways and fix any cracks. Clear paths of anything that can make you trip, such as tools or rocks. Add color or contrast paint or tape to clearly mark and help you see anything that might make you trip as you walk through a door, such as a raised step or threshold. Trim any bushes or trees on paths to your home. Check to see if handrails are loose   or broken and that both sides of all steps have handrails. Install guardrails along the edges of any raised decks and porches. Have leaves, snow, or ice cleared regularly. Use sand, salt, or ice melter on paths if you live where there is ice and snow during the winter. Clean up any spills in your garage right away. This includes grease or oil spills. What other actions can I take? Review your medicines with your doctor. Some medicines can cause dizziness or changes in blood pressure, which increase your risk of falling. Wear shoes that: Have a low heel. Do not wear high heels. Have rubber bottoms and are closed at the toe. Feel good on your feet and fit well. Use tools that help you move around if needed. These include: Canes. Walkers. Scooters. Crutches. Ask  your doctor what else you can do to help prevent falls. This may include seeing a physical therapist to learn to do exercises to move better and get stronger. Where to find more information Centers for Disease Control and Prevention, [employer]: cdc.gov National Institute on Aging: nia.nih.gov National Institute on Aging: nia.nih.gov Contact a doctor if: You are afraid of falling at home. You feel weak, drowsy, or dizzy at home. You fall at home. Get help right away if you: Lose consciousness or have trouble moving after a fall. Have a fall that causes a head injury. These symptoms may be an emergency. Get help right away. Call 911. Do not wait to see if the symptoms will go away. Do not drive yourself to the hospital. This information is not intended to replace advice given to you by your health care provider. Make sure you discuss any questions you have with your health care provider. Document Revised: 11/05/2021 Document Reviewed: 11/05/2021 Elsevier Patient Education  2024 [employer]vier Inc.  

## 2022-11-14 ENCOUNTER — Encounter: Payer: Self-pay | Admitting: Nurse Practitioner

## 2022-11-15 ENCOUNTER — Encounter: Payer: Self-pay | Admitting: Nurse Practitioner

## 2022-12-04 ENCOUNTER — Ambulatory Visit (INDEPENDENT_AMBULATORY_CARE_PROVIDER_SITE_OTHER): Payer: PPO | Admitting: Nurse Practitioner

## 2022-12-04 ENCOUNTER — Encounter: Payer: Self-pay | Admitting: Nurse Practitioner

## 2022-12-04 VITALS — BP 130/70 | HR 66 | Temp 97.9°F | Resp 17 | Ht 60.0 in | Wt 123.4 lb

## 2022-12-04 DIAGNOSIS — R109 Unspecified abdominal pain: Secondary | ICD-10-CM

## 2022-12-04 DIAGNOSIS — W19XXXS Unspecified fall, sequela: Secondary | ICD-10-CM

## 2022-12-04 DIAGNOSIS — R3989 Other symptoms and signs involving the genitourinary system: Secondary | ICD-10-CM | POA: Diagnosis not present

## 2022-12-04 NOTE — Progress Notes (Signed)
Assessment and Plan:  Haley Shah was seen today for an episodic visit.  Diagnoses and all order for this visit:  Right flank pain/Urine discoloration Nephrolithiasis versus UTI Stay well hydrated to keep urinary system well flushed Consider daily cranberry juice or oral supplement to help any bacteria from adhering to bladder wall causing increase for infection. Monitor for any increase in fever, chills, N/V, abdominal pain, hematuria.    - Urinalysis, Routine w reflex microscopic - Urine Culture  Fall, sequela S/p fall 11/14/22 Possible release of kidney stone give right flank pain with radiation to RLQ. Continue to monitor   Notify office for further evaluation and treatment, questions or concerns if s/s fail to improve. The risks and benefits of my recommendations, as well as other treatment options were discussed with the patient today. Questions were answered.  Further disposition pending results of labs. Discussed med's effects and SE's.    Over 15 minutes of exam, counseling, chart review, and critical decision making was performed.   Future Appointments  Date Time Provider Department Center  01/20/2023 10:00 AM Adela Glimpse, NP [facility] None  06/10/2023 11:00 AM Raynelle Dick, NP [facility] None    ------------------------------------------------------------------------------------------------------------------   HPI BP 130/70   Pulse 66   Temp 97.9 F (36.6 C)   Resp 17   Ht 5' (1.524 m)   Wt 123 lb 6.4 oz (56 kg)   SpO2 99%   BMI 24.10 kg/m   81 y.o.female presents for evaluation of right flank pain that x 3 days. Pain radiates midway across RLQ.  Hurts worse when lying and sitting, hard to get OOB d/t pain.  Does note to be colicky.  No hx of nephrolithiasis.  However, she did have a recent fall 11/12/22 onto the right side injuring her hip with generalized ecchymosis, sill healing.  No break or fracture to right hip. Has noted a different color to  her urine.  No dysuria or frequency.  Denies fever, chills, N/V.  Reports staying well hydrated.  BM are daily.  Some increase in belching.    Past Medical History:  Diagnosis Date   Allergy    Cancer (HCC) 2010   Left Breast   Dyspnea    chronic-no cardiac reason   GERD (gastroesophageal reflux disease)    Glaucoma    History of thyroid cancer    Hyperlipidemia    Hypertension    no meds   IBS (irritable bowel syndrome)    Pre-diabetes    Sciatica    RLE   Thyroid disease    Hypo   Vitamin D deficiency    Wears glasses      Allergies  Allergen Reactions   Naprosyn [Naproxen]     Raw mouth   Neomycin-Bacitracin Zn-Polymyx     REACTION: rash   Propoxyphene Hcl     REACTION: syncope   Darvon Other (See Comments)    Almost fainted when taking, over 50 years.    Current Outpatient Medications on File Prior to Visit  Medication Sig   aspirin 81 MG tablet Take 81 mg by mouth daily.   cetirizine (ZYRTEC ALLERGY) 10 MG tablet Take 10 mg by mouth daily.   DM-APAP-CPM (CORICIDIN HBP PO) Take by mouth.   guaiFENesin (MUCINEX PO) Take by mouth.   latanoprost (XALATAN) 0.005 % ophthalmic solution Place 1 drop into both eyes at bedtime.   levothyroxine (SYNTHROID) 75 MCG tablet Take 1 tablet (75 mcg total) by mouth daily before breakfast.  rosuvastatin (CRESTOR) 40 MG tablet TAKE 1 TABLET BY MOUTH 5 DAYS PER WEEK OR AS DIRECTED (SKIP TUESDAY AND SATURDAY) FOR CHOLESTEROL   VITAMIN D, CHOLECALCIFEROL, PO Take 2,000 Int'l Units by mouth.   Wheat Dextrin (BENEFIBER PO) Take by mouth.   No current facility-administered medications on file prior to visit.    ROS: all negative except what is noted in the HPI.   Physical Exam:  BP 130/70   Pulse 66   Temp 97.9 F (36.6 C)   Resp 17   Ht 5' (1.524 m)   Wt 123 lb 6.4 oz (56 kg)   SpO2 99%   BMI 24.10 kg/m   General Appearance: NAD.  Awake, conversant and cooperative. Eyes: PERRLA, EOMs intact.  Sclera white.  Conjunctiva  without erythema. Sinuses: No frontal/maxillary tenderness.  No nasal discharge. Nares patent.  ENT/Mouth: Ext aud canals clear.  Bilateral TMs w/DOL and without erythema or bulging. Hearing intact.  Posterior pharynx without swelling or exudate.  Tonsils without swelling or erythema.  Neck: Supple.  No masses, nodules or thyromegaly. Respiratory: Effort is regular with non-labored breathing. Breath sounds are equal bilaterally without rales, rhonchi, wheezing or stridor.  Cardio: RRR with no MRGs. Brisk peripheral pulses without edema.  Abdomen: Tender to palpation along Right flank area and RLQ.  Active BS in all four quadrants.  Soft and  without guarding, rebound tenderness, hernias or masses. Lymphatics: Non tender without lymphadenopathy.  Musculoskeletal: Full ROM, 5/5 strength, normal ambulation.  No clubbing or cyanosis. Skin: Appropriate color for ethnicity. Warm without rashes, lesions, ecchymosis, ulcers.  Neuro: CN II-XII grossly normal. Normal muscle tone without cerebellar symptoms and intact sensation.   Psych: AO X 3,  appropriate mood and affect, insight and judgment.     Adela Glimpse, NP 1:47 PM [facility]

## 2022-12-04 NOTE — Patient Instructions (Signed)
Kidney Stones Kidney stones are rock-like masses that form inside of the kidneys. Kidneys are organs that make pee (urine). A kidney stone may move into other parts of the urinary tract, including: The tubes that connect the kidneys to the bladder (ureters). The bladder. The tube that carries urine out of the body (urethra). Kidney stones can cause very bad pain and can block the flow of pee. The stone usually leaves your body through your pee. A doctor may need to take out the stone. What are the causes? Kidney stones may be caused by: Too much calcium in the body. This may be caused by too much parathyroid hormone in the blood. Uric acid crystals in the bladder. The body makes uric acid when you eat certain foods. Narrowing of one or both of the ureters. A kidney blockage that you were born with. Past surgery on the kidney or the ureters. What increases the risk? You are more likely to develop this condition if: You have had a kidney stone in the past. Other people in your family have had kidney stones. You do not drink enough water. You eat a diet that is high in protein, salt (sodium), or sugar. You are very overweight (obese). What are the signs or symptoms? Symptoms of a kidney stone may include: Pain in the side of the belly, right below the ribs. Pain usually spreads to the groin. Needing to pee often or right away. Pain when peeing. Blood in your pee. Feeling like you may vomit (nauseous). Vomiting. Fever and chills. How is this treated? Treatment depends on the size, location, and makeup of the kidney stones. The stones will often pass out of the body when you pee. You may need to: Drink more fluid to help pass the stone. In some cases, you may be given fluids through an IV tube at the hospital. Take medicine for pain. Change your diet to help keep kidney stones from coming back. Sometimes, you may need: A procedure to break up kidney stones using a beam of light (laser)  or shock waves. Surgery to remove the kidney stones. Follow these instructions at home: Medicines Take over-the-counter and prescription medicines only as told by your doctor. Ask your doctor if the medicine prescribed to you requires you to avoid driving or using machinery. Eating and drinking Drink enough fluid to keep your pee pale yellow. You may be told to drink at least 8-10 glasses of water each day. This will help you pass the stone. If told by your doctor, change your diet. You may be told to: Limit how much salt you eat. Eat more fruits and vegetables. Limit how much meat, poultry, fish, and eggs you eat. Follow instructions from your doctor about what you may eat and drink. General instructions Collect pee samples as told by your doctor. You may need to collect a pee sample: 24 hours after a stone comes out. 8-12 weeks after a stone comes out, and every 6-12 months after that. Strain your pee every time you pee. Use the strainer that your doctor recommends. Do not throw out the stone. Keep it so that it can be tested by your doctor. Keep all follow-up visits. You may need X-rays and ultrasounds to make sure the stone has come out. How is this prevented? To prevent another kidney stone: Drink enough fluid to keep your pee pale yellow. This is the best way to prevent kidney stones. Eat healthy foods. Avoid certain foods as told by your doctor. You  may be told to eat less protein. Stay at a healthy weight. Where to find more information National Kidney Foundation (NKF): kidney.org [facility]): urologyhealth.org Contact a doctor if: You have pain that gets worse or does not get better with medicine. Get help right away if: You have a fever or chills. You get very bad pain. You get new pain in your belly. You faint. You cannot pee. This information is not intended to replace advice given to you by your health care provider. Make sure you discuss any  questions you have with your health care provider. Document Revised: 10/26/2021 Document Reviewed: 10/26/2021 Elsevier Patient Education  2024 [employer].

## 2022-12-05 LAB — URINALYSIS, ROUTINE W REFLEX MICROSCOPIC
Bacteria, UA: NONE SEEN /HPF
Bilirubin Urine: NEGATIVE
Glucose, UA: NEGATIVE
Hyaline Cast: NONE SEEN /LPF
Ketones, ur: NEGATIVE
Leukocytes,Ua: NEGATIVE
Nitrite: NEGATIVE
Protein, ur: NEGATIVE
RBC / HPF: NONE SEEN /HPF (ref 0–2)
Specific Gravity, Urine: 1.005 (ref 1.001–1.035)
Squamous Epithelial / HPF: NONE SEEN /HPF (ref <=5)
WBC, UA: NONE SEEN /HPF (ref 0–5)
pH: 6 (ref 5.0–8.0)

## 2022-12-05 LAB — MICROSCOPIC MESSAGE

## 2022-12-05 LAB — URINE CULTURE
MICRO NUMBER:: 15484911
Result:: NO GROWTH
SPECIMEN QUALITY:: ADEQUATE

## 2022-12-10 ENCOUNTER — Encounter: Payer: Self-pay | Admitting: Nurse Practitioner

## 2022-12-18 ENCOUNTER — Ambulatory Visit: Payer: PPO

## 2022-12-19 ENCOUNTER — Ambulatory Visit: Payer: PPO

## 2023-01-14 DIAGNOSIS — M1711 Unilateral primary osteoarthritis, right knee: Secondary | ICD-10-CM | POA: Diagnosis not present

## 2023-01-20 ENCOUNTER — Encounter: Payer: Self-pay | Admitting: Nurse Practitioner

## 2023-01-20 ENCOUNTER — Ambulatory Visit (INDEPENDENT_AMBULATORY_CARE_PROVIDER_SITE_OTHER): Payer: PPO | Admitting: Nurse Practitioner

## 2023-01-20 VITALS — BP 146/70 | HR 64 | Temp 97.9°F | Ht 60.5 in | Wt 122.0 lb

## 2023-01-20 DIAGNOSIS — R7309 Other abnormal glucose: Secondary | ICD-10-CM | POA: Diagnosis not present

## 2023-01-20 DIAGNOSIS — Z136 Encounter for screening for cardiovascular disorders: Secondary | ICD-10-CM

## 2023-01-20 DIAGNOSIS — Z85850 Personal history of malignant neoplasm of thyroid: Secondary | ICD-10-CM | POA: Diagnosis not present

## 2023-01-20 DIAGNOSIS — E782 Mixed hyperlipidemia: Secondary | ICD-10-CM

## 2023-01-20 DIAGNOSIS — Z853 Personal history of malignant neoplasm of breast: Secondary | ICD-10-CM

## 2023-01-20 DIAGNOSIS — Z1389 Encounter for screening for other disorder: Secondary | ICD-10-CM | POA: Diagnosis not present

## 2023-01-20 DIAGNOSIS — E039 Hypothyroidism, unspecified: Secondary | ICD-10-CM

## 2023-01-20 DIAGNOSIS — I10 Essential (primary) hypertension: Secondary | ICD-10-CM | POA: Diagnosis not present

## 2023-01-20 DIAGNOSIS — Z79899 Other long term (current) drug therapy: Secondary | ICD-10-CM

## 2023-01-20 DIAGNOSIS — M8580 Other specified disorders of bone density and structure, unspecified site: Secondary | ICD-10-CM

## 2023-01-20 DIAGNOSIS — J302 Other seasonal allergic rhinitis: Secondary | ICD-10-CM

## 2023-01-20 DIAGNOSIS — E559 Vitamin D deficiency, unspecified: Secondary | ICD-10-CM

## 2023-01-20 DIAGNOSIS — Z0000 Encounter for general adult medical examination without abnormal findings: Secondary | ICD-10-CM

## 2023-01-20 DIAGNOSIS — Z0001 Encounter for general adult medical examination with abnormal findings: Secondary | ICD-10-CM | POA: Diagnosis not present

## 2023-01-20 DIAGNOSIS — J449 Chronic obstructive pulmonary disease, unspecified: Secondary | ICD-10-CM

## 2023-01-20 DIAGNOSIS — K219 Gastro-esophageal reflux disease without esophagitis: Secondary | ICD-10-CM

## 2023-01-20 DIAGNOSIS — K588 Other irritable bowel syndrome: Secondary | ICD-10-CM

## 2023-01-20 NOTE — Patient Instructions (Signed)

## 2023-01-20 NOTE — Progress Notes (Signed)
CPE  Assessment:   CPE Due annually  Health maintenance reviewed Healthily lifestyle goals set  Abnormal Glucose Education: Reviewed 'ABCs' of diabetes management  Discussed goals to be met and/or maintained include A1C (<7) Blood pressure (<130/80) Cholesterol (LDL <70) Continue Eye Exam yearly  Continue Dental Exam Q6 mo Discussed dietary recommendations Discussed Physical Activity recommendations Check A1C  Osteopenia DEXA 01/2021 T score -2.0 - Due - order placed.  Pursue a combination of weight-bearing exercises and strength training. Advised on fall prevention measures including proper lighting in all rooms, removal of area rugs and floor clutter, use of walking devices as deemed appropriate, avoidance of uneven walking surfaces. Smoking cessation and moderate alcohol consumption if applicable Consume 800 to 1000 IU of vitamin D daily with a goal vitamin D serum value of 30 ng/mL or higher. Aim for 1000 to 1200 mg of elemental calcium daily through dietary sources.  Hyperlipidemia/Aortic Atherosclerosis(HCC) per CT 2022 Discussed lifestyle modifications. Recommended diet heavy in fruits and veggies, omega 3's. Decrease consumption of animal meats, cheeses, and dairy products. Remain active and exercise as tolerated. Continue to monitor. Check lipids/TSH  Allergy Avoid triggerd, OTC agent PRN, monitor  Vitamin D deficiency Continue supplement for goal of 60-100 Monitor Vitamin D levels  Medication Mangaement All medications discussed and reviewed in full. All questions and concerns regarding medications addressed.    History of breast cancer UTD 02/2022  Essential hypertension Discussed DASH (Dietary Approaches to Stop Hypertension) DASH diet is lower in sodium than a typical American diet. Cut back on foods that are high in saturated fat, cholesterol, and trans fats. Eat more whole-grain foods, fish, poultry, and nuts Remain active and exercise as  tolerated daily.  Monitor BP at home-Call if greater than 130/80.  Check CMP/CBC  Thyroid Cancer,Hx S/p resection remotely;  Continue thyroid replacement  Hypothyroidism Controlled. Continue Levothyroxine. Reminded to take on an empty stomach 30-2mins before food.  Stop any Biotin Supplement 48-72 hours before next TSH level to reduce the risk of falsely low TSH levels. Continue to monitor.    Gastroesophageal reflux disease, esophagitis presence not specified No suspected reflux complications (Barret/stricture). Lifestyle modification:  wt loss, avoid meals 2-3h before bedtime. Consider eliminating food triggers:  chocolate, caffeine, EtOH, acid/spicy food.  Other irritable bowel syndrome If not on benefiber then add it, decrease stress,  if any worsening symptoms, blood in stool, AB pain, etc call office  COPD By imaging CXR 2010, no smoking/exposure history, age related Trelegy sample given - not used Denies sx; monitor  Screening for blood or protein in urine Check and monitor Urinalysis/Microalbumin Routine w reflex microscopic   Orders Placed This Encounter  Procedures   DG Bone Density    Standing Status:   Future    Standing Expiration Date:   01/20/2024    Order Specific Question:   Reason for Exam (SYMPTOM  OR DIAGNOSIS REQUIRED)    Answer:   Osteopenia    Order Specific Question:   Preferred imaging location?    Answer:   [facility]   CBC with Differential/Platelet   COMPLETE METABOLIC PANEL WITH GFR   Magnesium   Lipid panel   TSH   Hemoglobin A1c   Insulin, random   VITAMIN D 25 Hydroxy (Vit-D Deficiency, Fractures)   Urinalysis, Routine w reflex microscopic   Microalbumin / creatinine urine ratio   EKG 12-Lead    Notify office for further evaluation and treatment, questions or concerns if any reported s/s fail to improve.  The patient was advised to call back or seek an in-person evaluation if any symptoms worsen or if the condition fails  to improve as anticipated.   Further disposition pending results of labs. Discussed med's effects and SE's.    I discussed the assessment and treatment plan with the patient. The patient was provided an opportunity to ask questions and all were answered. The patient agreed with the plan and demonstrated an understanding of the instructions.  Discussed med's effects and SE's. Screening labs and tests as requested with regular follow-up as recommended.  I provided 40 minutes of face-to-face time during this encounter including counseling, chart review, and critical decision making was preformed.  Today's Plan of Care is based on a patient-centered health care approach known as shared decision making - the decisions, tests and treatments allow for patient preferences and values to be balanced with clinical evidence.    Future Appointments  Date Time Provider Department Center  06/10/2023 11:00 AM Raynelle Dick, NP [facility] None  01/20/2024 10:00 AM Adela Glimpse, NP [facility] None     Subjective:   Haley Shah is a 82 y.o. female who presents for a annual CPE. She has Hypothyroidism; Hyperlipidemia; GERD; IBS; Vertigo; Thyroid Cancer,Hx; History of left breast cancer; Other abnormal glucose (hx of prediabetes); Vitamin D deficiency; Medication management; Essential hypertension; COPD (chronic obstructive pulmonary disease) (HCC); Primary osteoarthritis of both knees; B12 deficiency; and Osteopenia on their problem list.  Overall she reports feeling well today.  She is married, 2 children, 5 grandchilren, 1 great granchild, all local. 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   Has history of left breast cancer in 2009, continues with annual mammograms, had negative in 01/2020.   She has COPD/hyperinflation by imaging on CXR since 2010; denies history of smoking, denies dyspnea, cough, secretions.   She has bil knee arthritis, has seen ortho for injections,  declined surgery. Does have some pain, takes tylenol PRN if will be walking a lot but prefers to avoid medication, will use topicals occasional. She had injection in right knee that helped a lot.   She has benign essential tremor, mild, worse in AM, declines medication.   BMI is Body mass index is 23.43 kg/m., she has been working on diet. Wt Readings from Last 3 Encounters:  01/20/23 122 lb (55.3 kg)  12/04/22 123 lb 6.4 oz (56 kg)  11/12/22 124 lb 3.2 oz (56.3 kg)   Her blood pressure has been controlled at home, today their BP is BP: (!) 146/70  BP Readings from Last 3 Encounters:  01/20/23 (!) 146/70  12/04/22 130/70  11/12/22 138/76   She does not workout. She denies chest pain, shortness of breath, dizziness.    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 Avoids NSAIDs and pushing water getting 65 fluid ounces daily  Lab Results  Component Value Date   EGFR 65 06/18/2022    She is on cholesterol medication, rosuvastatin 40 mg tab 5 days a week.Her cholesterol is at goal. The cholesterol last visit was:   Lab Results  Component Value Date   CHOL 146 06/18/2022   HDL 59 06/18/2022   LDLCALC 68 06/18/2022   TRIG 108 06/18/2022   CHOLHDL 2.5 06/18/2022     Last A1C in the office was:  Lab Results  Component Value Date   HGBA1C 5.5 01/16/2022   Patient is on Vitamin D supplement. Lab Results  Component Value Date   VD25OH 63 01/16/2022   She is  on thyroid medication following partial thyroidectomy for thyroid tumor. Her medication was not changed last visit Patient denies nervousness and palpitations.  Lab Results  Component Value Date   TSH 3.28 06/18/2022   She reports did start on PO B12, not SL, not sure of levels.  Lab Results  Component Value Date   VITAMINB12 561 06/08/2020     Medication Review Current Outpatient Medications on File Prior to Visit  Medication Sig Dispense Refill   aspirin 81 MG tablet Take 81 mg by mouth daily.     cetirizine (ZYRTEC ALLERGY) 10 MG  tablet Take 10 mg by mouth daily.     DM-APAP-CPM (CORICIDIN HBP PO) Take by mouth.     latanoprost (XALATAN) 0.005 % ophthalmic solution Place 1 drop into both eyes at bedtime.     levothyroxine (SYNTHROID) 75 MCG tablet Take 1 tablet (75 mcg total) by mouth daily before breakfast. 90 tablet 2   Multiple Vitamin (MULTIVITAMIN ADULT PO) Take by mouth every other day.     rosuvastatin (CRESTOR) 40 MG tablet TAKE 1 TABLET BY MOUTH 5 DAYS PER WEEK OR AS DIRECTED (SKIP TUESDAY AND SATURDAY) FOR CHOLESTEROL 60 tablet 3   VITAMIN D, CHOLECALCIFEROL, PO Take 2,000 Int'l Units by mouth.     Wheat Dextrin (BENEFIBER PO) Take by mouth.     guaiFENesin (MUCINEX PO) Take by mouth. (Patient not taking: Reported on 01/20/2023)     No current facility-administered medications on file prior to visit.    Current Problems (verified) Patient Active Problem List   Diagnosis Date Noted   Osteopenia 01/16/2021   B12 deficiency 01/18/2020   Primary osteoarthritis of both knees 04/08/2019   COPD (chronic obstructive pulmonary disease) (HCC) 12/14/2017   Essential hypertension 10/27/2013   Medication management 04/23/2013   Other abnormal glucose (hx of prediabetes)    Vitamin D deficiency    History of left breast cancer 11/03/2012   Hypothyroidism 03/22/2008   Hyperlipidemia 03/22/2008   GERD 03/22/2008   IBS 03/22/2008   Vertigo 03/22/2008   Thyroid Cancer,Hx 03/22/2008    Screening Tests Immunization History  Administered Date(s) Administered   Fluad Quad(high Dose 65+) 12/30/2021, 12/18/2022   Influenza, High Dose Seasonal PF 01/05/2013, 01/05/2015, 11/27/2015, 12/20/2016, 12/15/2017, 12/17/2018, 01/17/2020, 01/01/2021   PFIZER(Purple Top)SARS-COV-2 Vaccination 04/25/2019, 05/19/2019, 03/13/2020   Pneumococcal Conjugate-13 03/02/2015   Pneumococcal Polysaccharide-23 03/18/2008   Td 03/18/2005   Tdap 12/01/2018    Health Maintenance  Topic Date Due   Zoster Vaccines- Shingrix (1 of 2) Never  done   COVID-19 Vaccine (4 - 2023-24 season) 11/17/2022   MAMMOGRAM  03/05/2023   Medicare Annual Wellness (AWV)  06/18/2023   DTaP/Tdap/Td (3 - Td or Tdap) 11/30/2028   Pneumonia Vaccine 64+ Years old  Completed   INFLUENZA VACCINE  Completed   DEXA SCAN  Completed   HPV VACCINES  Aged Out    Names of Other Physician/Practitioners you currently use: 1. [facility]- here for primary care 2. Dr. Dione Booze, eye doctor, 12/2022  3.  Dr. Mackie Pai, dentist, last visit 04/2022 4. Dr. Arminda Resides, derm, last visit 2024, goes annually for full body checks  Patient Care Team: Lucky Cowboy, MD as PCP - General (Internal Medicine)   Allergies Allergies  Allergen Reactions   Naprosyn [Naproxen]     Raw mouth   Neomycin-Bacitracin Zn-Polymyx     REACTION: rash   Propoxyphene Hcl     REACTION: syncope   Darvon Other (See Comments)  Almost fainted when taking, over 50 years.    SURGICAL HISTORY She  has a past surgical history that includes Thyroid surgery (1986); Breast lumpectomy (2010); Colonoscopy; Knee arthroscopy with medial menisectomy (Left, 05/25/2014); Knee arthroscopy with lateral menisectomy (Left, 05/25/2014); Chondroplasty (Left, 05/25/2014); Rotator cuff repair (Right, 05/27/2018); Cataract extraction, bilateral (Bilateral); and Cardiac catheterization (2006). FAMILY HISTORY Her family history includes Cancer in her father; Colon cancer in her father; Kidney disease in her paternal grandmother; Ovarian cancer in her sister; Stroke in her father and paternal grandfather. SOCIAL HISTORY She  reports that she has never smoked. She has never used smokeless tobacco. She reports that she does not drink alcohol and does not use drugs.  Review of Systems  Constitutional:  Negative for malaise/fatigue and weight loss.  HENT:  Negative for hearing loss and tinnitus.   Eyes:  Negative for blurred vision and double vision.  Respiratory:   Negative for cough, sputum production, shortness of breath and wheezing.   Cardiovascular:  Negative for chest pain, palpitations, orthopnea, claudication, leg swelling and PND.  Gastrointestinal:  Negative for abdominal pain, blood in stool, constipation, diarrhea, heartburn, melena, nausea and vomiting.  Genitourinary: Negative.   Musculoskeletal:  Positive for joint pain (knees). Negative for falls and myalgias.  Skin:  Negative for rash.  Neurological:  Negative for dizziness, tingling, sensory change, weakness and headaches.  Endo/Heme/Allergies:  Negative for polydipsia.  Psychiatric/Behavioral: Negative.  Negative for depression, memory loss, substance abuse and suicidal ideas. The patient is not nervous/anxious and does not have insomnia.   All other systems reviewed and are negative.    Objective:   Blood pressure (!) 146/70, pulse 64, temperature 97.9 F (36.6 C), height 5' 0.5" (1.537 m), weight 122 lb (55.3 kg), SpO2 99%. Body mass index is 23.43 kg/m.  General appearance: alert, no distress, WD/WN,  female HEENT: normocephalic, sclerae anicteric, TMs pearly, nares patent, no discharge or erythema, pharynx normal Oral cavity: MMM, no lesions Neck: supple, no lymphadenopathy, no thyromegaly, no masses Heart: RRR, normal S1, S2, no murmurs Lungs: CTA bilaterally, no wheezes, rhonchi, or rales Abdomen: +bs, soft, non tender, non distended, no masses, no hepatomegaly, no splenomegaly Musculoskeletal: nontender, no swelling, no obvious deformity. Patient is able to ambulate well.  Extremities: no edema, no cyanosis, no clubbing Pulses: 2+ symmetric, upper and lower extremities, normal cap refill Neurological: alert, oriented x 3, CN2-12 intact, strength normal upper extremities and lower extremities, sensation normal throughout, DTRs 2+ throughout, no cerebellar signs, gait normal Psychiatric: normal affect, behavior normal, pleasant  Skin: warm, dry, intact; no concerning  rash, lesions;    Adela Glimpse, NP   01/20/2023

## 2023-01-21 LAB — COMPLETE METABOLIC PANEL WITH GFR
AG Ratio: 1.7 (calc) (ref 1.0–2.5)
ALT: 10 U/L (ref 6–29)
AST: 14 U/L (ref 10–35)
Albumin: 4.4 g/dL (ref 3.6–5.1)
Alkaline phosphatase (APISO): 66 U/L (ref 37–153)
BUN: 15 mg/dL (ref 7–25)
CO2: 29 mmol/L (ref 20–32)
Calcium: 10.2 mg/dL (ref 8.6–10.4)
Chloride: 103 mmol/L (ref 98–110)
Creat: 0.9 mg/dL (ref 0.60–0.95)
Globulin: 2.6 g/dL (ref 1.9–3.7)
Glucose, Bld: 76 mg/dL (ref 65–139)
Potassium: 4.9 mmol/L (ref 3.5–5.3)
Sodium: 141 mmol/L (ref 135–146)
Total Bilirubin: 0.5 mg/dL (ref 0.2–1.2)
Total Protein: 7 g/dL (ref 6.1–8.1)
eGFR: 63 mL/min/{1.73_m2} (ref >=60)

## 2023-01-21 LAB — URINALYSIS, ROUTINE W REFLEX MICROSCOPIC
Bacteria, UA: NONE SEEN /[HPF]
Bilirubin Urine: NEGATIVE
Glucose, UA: NEGATIVE
Hyaline Cast: NONE SEEN /[LPF]
Ketones, ur: NEGATIVE
Leukocytes,Ua: NEGATIVE
Nitrite: NEGATIVE
Protein, ur: NEGATIVE
RBC / HPF: NONE SEEN /[HPF] (ref 0–2)
Specific Gravity, Urine: 1.004 (ref 1.001–1.035)
Squamous Epithelial / HPF: NONE SEEN /[HPF] (ref <=5)
WBC, UA: NONE SEEN /[HPF] (ref 0–5)
pH: 7 (ref 5.0–8.0)

## 2023-01-21 LAB — CBC WITH DIFFERENTIAL/PLATELET
Absolute Lymphocytes: 2095 {cells}/uL (ref 850–3900)
Absolute Monocytes: 892 {cells}/uL (ref 200–950)
Basophils Absolute: 58 {cells}/uL (ref 0–200)
Basophils Relative: 0.6 %
Eosinophils Absolute: 146 {cells}/uL (ref 15–500)
Eosinophils Relative: 1.5 %
HCT: 42.3 % (ref 35.0–45.0)
Hemoglobin: 14 g/dL (ref 11.7–15.5)
MCH: 30.2 pg (ref 27.0–33.0)
MCHC: 33.1 g/dL (ref 32.0–36.0)
MCV: 91.4 fL (ref 80.0–100.0)
MPV: 9.6 fL (ref 7.5–12.5)
Monocytes Relative: 9.2 %
Neutro Abs: 6509 {cells}/uL (ref 1500–7800)
Neutrophils Relative %: 67.1 %
Platelets: 292 10*3/uL (ref 140–400)
RBC: 4.63 10*6/uL (ref 3.80–5.10)
RDW: 12.8 % (ref 11.0–15.0)
Total Lymphocyte: 21.6 %
WBC: 9.7 10*3/uL (ref 3.8–10.8)

## 2023-01-21 LAB — VITAMIN D 25 HYDROXY (VIT D DEFICIENCY, FRACTURES): Vit D, 25-Hydroxy: 64 ng/mL (ref 30–100)

## 2023-01-21 LAB — HEMOGLOBIN A1C
Hgb A1c MFr Bld: 5.4 %{Hb} (ref <5.7)
Mean Plasma Glucose: 108 mg/dL
eAG (mmol/L): 6 mmol/L

## 2023-01-21 LAB — LIPID PANEL
Cholesterol: 161 mg/dL (ref <200)
HDL: 62 mg/dL (ref >=50)
LDL Cholesterol (Calc): 78 mg/dL
Non-HDL Cholesterol (Calc): 99 mg/dL (ref <130)
Total CHOL/HDL Ratio: 2.6 (calc) (ref <5.0)
Triglycerides: 131 mg/dL (ref <150)

## 2023-01-21 LAB — MICROALBUMIN / CREATININE URINE RATIO
Creatinine, Urine: 15 mg/dL — ABNORMAL LOW (ref 20–275)
Microalb, Ur: <0.2 mg/dL

## 2023-01-21 LAB — MAGNESIUM: Magnesium: 2.1 mg/dL (ref 1.5–2.5)

## 2023-01-21 LAB — MICROSCOPIC MESSAGE

## 2023-01-21 LAB — TSH: TSH: 1.53 m[IU]/L (ref 0.40–4.50)

## 2023-01-21 LAB — INSULIN, RANDOM: Insulin: 5.4 u[IU]/mL

## 2023-02-04 DIAGNOSIS — H401131 Primary open-angle glaucoma, bilateral, mild stage: Secondary | ICD-10-CM | POA: Diagnosis not present

## 2023-02-04 DIAGNOSIS — H43813 Vitreous degeneration, bilateral: Secondary | ICD-10-CM | POA: Diagnosis not present

## 2023-02-04 DIAGNOSIS — H26492 Other secondary cataract, left eye: Secondary | ICD-10-CM | POA: Diagnosis not present

## 2023-02-04 DIAGNOSIS — H02834 Dermatochalasis of left upper eyelid: Secondary | ICD-10-CM | POA: Diagnosis not present

## 2023-02-04 DIAGNOSIS — Z961 Presence of intraocular lens: Secondary | ICD-10-CM | POA: Diagnosis not present

## 2023-02-04 DIAGNOSIS — H02831 Dermatochalasis of right upper eyelid: Secondary | ICD-10-CM | POA: Diagnosis not present

## 2023-02-04 DIAGNOSIS — H1045 Other chronic allergic conjunctivitis: Secondary | ICD-10-CM | POA: Diagnosis not present

## 2023-02-04 DIAGNOSIS — H04123 Dry eye syndrome of bilateral lacrimal glands: Secondary | ICD-10-CM | POA: Diagnosis not present

## 2023-03-05 DIAGNOSIS — Z1231 Encounter for screening mammogram for malignant neoplasm of breast: Secondary | ICD-10-CM | POA: Diagnosis not present

## 2023-03-05 DIAGNOSIS — M8588 Other specified disorders of bone density and structure, other site: Secondary | ICD-10-CM | POA: Diagnosis not present

## 2023-03-05 DIAGNOSIS — Z853 Personal history of malignant neoplasm of breast: Secondary | ICD-10-CM | POA: Diagnosis not present

## 2023-03-05 DIAGNOSIS — R29890 Loss of height: Secondary | ICD-10-CM | POA: Diagnosis not present

## 2023-03-20 LAB — HM MAMMOGRAPHY

## 2023-03-28 ENCOUNTER — Encounter: Payer: Self-pay | Admitting: Internal Medicine

## 2023-06-04 ENCOUNTER — Encounter: Payer: Self-pay | Admitting: Family Medicine

## 2023-06-04 ENCOUNTER — Ambulatory Visit (INDEPENDENT_AMBULATORY_CARE_PROVIDER_SITE_OTHER): Payer: PPO | Admitting: Family Medicine

## 2023-06-04 VITALS — BP 150/70 | HR 78 | Temp 98.0°F | Ht 60.5 in | Wt 122.8 lb

## 2023-06-04 DIAGNOSIS — E782 Mixed hyperlipidemia: Secondary | ICD-10-CM | POA: Diagnosis not present

## 2023-06-04 DIAGNOSIS — I10 Essential (primary) hypertension: Secondary | ICD-10-CM

## 2023-06-04 DIAGNOSIS — J309 Allergic rhinitis, unspecified: Secondary | ICD-10-CM | POA: Insufficient documentation

## 2023-06-04 DIAGNOSIS — E039 Hypothyroidism, unspecified: Secondary | ICD-10-CM | POA: Diagnosis not present

## 2023-06-04 MED ORDER — LEVOTHYROXINE SODIUM 75 MCG PO TABS: 75.0000 ug | ORAL_TABLET | Freq: Every day | ORAL | 3 refills | Status: AC

## 2023-06-04 MED ORDER — ROSUVASTATIN CALCIUM 40 MG PO TABS: 40.0000 mg | ORAL_TABLET | Freq: Every day | ORAL | 3 refills | Status: AC

## 2023-06-04 NOTE — Assessment & Plan Note (Signed)
 Last lipids were at goal. Continue rosuvastatin 40 mg daily.

## 2023-06-04 NOTE — Progress Notes (Signed)
 [facility] [address] [place] [address] Dept: [phone] Dept Fax: [phone]  New Patient Office Visit  Subjective:    Patient ID: Haley Shah, female    DOB: Jul 06, 1940, 83 y.o..   MRN: [id number]  Chief Complaint  Patient presents with   Establish Care    NP-  establish care.  No concerns.    History of Present Illness:  Patient is in today to establish care. Haley Shah was born in [place], [place]. Her father went into the ministry, so when shew as 83 years old, they moved to [place], [place]. She later lived in [place] and then [place], finishing high school here. She attended [employer] for 1 year. 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 She has been married for 60 years. She has two sons (57, 66), four grandchildren, and 1 adopted great grandchild. Haley Shah denies use of tobacco, alcohol, or drugs.   Haley Shah notes she has white coat hypertension. She monitors this at home periodically and finds her blood pressures are normal in that setting.  Haley Shah has hyperlipidemia. She is managed on rosuvastatin 40 mg daily.  Haley Shah had a partial thyroidectomy in 1986 due to a thyroid cancer. She is now managed on levothyroxine 75 mcg daily.  Haley Shah has some  "possible" glaucoma. Her eye doctor has her using latanaprost eye drops.  Past Medical History: Patient Active Problem List   Diagnosis Date Noted   Allergic rhinitis 06/04/2023   Osteopenia 01/16/2021   B12 deficiency 01/18/2020   Primary osteoarthritis of both knees 04/08/2019   COPD (chronic obstructive pulmonary disease) (HCC) 12/14/2017   White coat syndrome with hypertension 10/27/2013   Other abnormal glucose (hx of prediabetes)    Vitamin D deficiency    History of left breast cancer 11/03/2012   Hypothyroidism 03/22/2008   Hyperlipidemia 03/22/2008   GERD  03/22/2008   IBS 03/22/2008   Vertigo 03/22/2008   Thyroid Cancer,Hx 03/22/2008   Past Surgical History:  Procedure Laterality Date   BREAST LUMPECTOMY Left 2010   LEFT BREAST-snbx   CARDIAC CATHETERIZATION  2006   normal coronary arteries   CATARACT EXTRACTION, BILATERAL Bilateral    2016 and 2018, Dr. Dione Booze    CHONDROPLASTY Left 05/25/2014   Procedure: CHONDROPLASTY;  Surgeon: Jodi Geralds, MD;  Location: [facility];  Service: Orthopedics;  Laterality: Left;   COLONOSCOPY     EYE SURGERY  cataracts   KNEE ARTHROSCOPY WITH LATERAL MENISECTOMY Left 05/25/2014   Procedure: KNEE ARTHROSCOPY WITH LATERAL MENISECTOMY;  Surgeon: Jodi Geralds, MD;  Location: [facility];  Service: Orthopedics;  Laterality: Left;   KNEE ARTHROSCOPY WITH MEDIAL MENISECTOMY Left 05/25/2014   Procedure: LEFT ARTHROSCOPY KNEE medial and lateral meniscectomy,femoral patella chondroplasty;  Surgeon: Jodi Geralds, MD;  Location: [facility];  Service: Orthopedics;  Laterality: Left;   ROTATOR CUFF REPAIR Right 05/27/2018   Dr. Luiz Blare   THYROIDECTOMY, PARTIAL  1986   partial thyroidectomy   Family History  Problem Relation Age of Onset   Early death Mother    Stroke Father    Cancer Father        Colon   Arthritis Father    Ovarian cancer Sister    Kidney disease Paternal Grandmother    Arthritis/Rheumatoid Paternal Grandmother    Stroke Paternal Grandfather    Outpatient Medications Prior to Visit  Medication  Sig Dispense Refill   aspirin 81 MG tablet Take 81 mg by mouth daily.     cetirizine (ZYRTEC ALLERGY) 10 MG tablet Take 10 mg by mouth daily.     guaiFENesin (MUCINEX PO) Take by mouth.     latanoprost (XALATAN) 0.005 % ophthalmic solution Place 1 drop into both eyes at bedtime.     Multiple Vitamin (MULTIVITAMIN ADULT PO) Take by mouth every other day.     VITAMIN D, CHOLECALCIFEROL, PO Take 2,000 Int'l Units by mouth.     Wheat Dextrin (BENEFIBER PO) Take  by mouth.     levothyroxine (SYNTHROID) 75 MCG tablet Take 1 tablet (75 mcg total) by mouth daily before breakfast. 90 tablet 2   rosuvastatin (CRESTOR) 40 MG tablet TAKE 1 TABLET BY MOUTH 5 DAYS PER WEEK OR AS DIRECTED (SKIP TUESDAY AND SATURDAY) FOR CHOLESTEROL 60 tablet 3   No facility-administered medications prior to visit.   Allergies  Allergen Reactions   Naprosyn [Naproxen]     Raw mouth   Neomycin-Bacitracin Zn-Polymyx     REACTION: rash   Propoxyphene Hcl     REACTION: syncope   Darvon Other (See Comments)    Almost fainted when taking, over 50 years.   Objective:   Today's Vitals   06/04/23 1341  BP: (!) 150/70  Pulse: 78  Temp: 98 F (36.7 C)  TempSrc: Temporal  SpO2: 100%  Weight: 122 lb 12.8 oz (55.7 kg)  Height: 5' 0.5" (1.537 m)   Body mass index is 23.59 kg/m.   General: Well developed, well nourished. No acute distress. Psych: Alert and oriented. Normal mood and affect.  Health Maintenance Due  Topic Date Due   Medicare Annual Wellness (AWV)  06/18/2023     Assessment & Plan:   Problem List Items Addressed This Visit       Cardiovascular and Mediastinum   White coat syndrome with hypertension - Primary   I request that [name] check her BP periodically at home. She should alert me if her pressures are regularly 130/80 or higher.      Relevant Medications   rosuvastatin (CRESTOR) 40 MG tablet     Endocrine   Hypothyroidism   Last TSH was at goal. Continue levothyroxine 75 mcg daily.      Relevant Medications   levothyroxine (SYNTHROID) 75 MCG tablet     Other   Hyperlipidemia   Last lipids were at goal. Continue rosuvastatin 40 mg daily.      Relevant Medications   rosuvastatin (CRESTOR) 40 MG tablet    Return in about 6 months (around 12/05/2023) for Reassessment.   Loyola Mast, MD

## 2023-06-04 NOTE — Assessment & Plan Note (Signed)
 Last TSH was at goal. Continue levothyroxine 75 mcg daily.

## 2023-06-04 NOTE — Assessment & Plan Note (Signed)
 I request that Haley Shah check her BP periodically at home. She should alert me if her pressures are regularly 130/80 or higher.

## 2023-06-10 ENCOUNTER — Ambulatory Visit: Payer: PPO | Admitting: Nurse Practitioner

## 2023-06-17 ENCOUNTER — Encounter: Payer: Self-pay | Admitting: Family Medicine

## 2023-06-27 ENCOUNTER — Encounter: Payer: Self-pay | Admitting: Family Medicine

## 2023-06-27 ENCOUNTER — Ambulatory Visit: Payer: Self-pay

## 2023-06-27 ENCOUNTER — Other Ambulatory Visit: Payer: Self-pay

## 2023-06-27 ENCOUNTER — Ambulatory Visit
Admission: RE | Admit: 2023-06-27 | Discharge: 2023-06-27 | Disposition: A | Discharge status: Home / Self Care | Source: Ambulatory Visit | Attending: Family Medicine | Admitting: Family Medicine

## 2023-06-27 VITALS — BP 147/74 | HR 77 | Temp 97.8°F | Resp 18

## 2023-06-27 DIAGNOSIS — G4486 Cervicogenic headache: Secondary | ICD-10-CM

## 2023-06-27 NOTE — Telephone Encounter (Signed)
  Chief Complaint: Headache Symptoms: headache with neck pain Frequency: started a couple of days ago Pertinent Negatives: Patient denies fever, CP, SOB, dizziness, weakness Disposition: [] ED /[x] Urgent Care (no appt availability in office) / [] Appointment(In office/virtual)/ []  [facility]/ [] Home Care/ [] Refused Recommended Disposition /[]  Mobile Bus/ []  Follow-up with PCP Additional Notes: patient called with concerns for headache. Patient states pain is in the base of skull for the past couple of days. Patient endorses some neck pain along with headache. Patient states pain is 4-5 out of 10. Per protocol, patient is recommended to be seen in 24 hours. Patient's PCP office has no availability. Patient is recommended to urgent care. Appointment made for urgent care for patient for today at 1:00 PM. Patient verbalized understanding of plan and all questions answered.    Copied from CRM [id number]. Topic: Clinical - Red Word Triage >> Jun 27, 2023 11:02 AM Drema Balzarine wrote: Red Word that prompted transfer to Nurse Triage: Patient is having pain going up into the base of her skull for the past couple of days, request an appointment for today Reason for Disposition  [1] MODERATE headache (e.g., interferes with normal activities) AND [2] present > 24 hours AND [3] unexplained  (Exceptions: analgesics not tried, typical migraine, or headache part of viral illness)  Answer Assessment - Initial Assessment Questions 1. LOCATION: "Where does it hurt?"      Base of skull 2. ONSET: "When did the headache start?" (Minutes, hours or days)      Started a couple of days ago 3. PATTERN: "Does the pain come and go, or has it been constant since it started?"     constant 4. SEVERITY: "How bad is the pain?" and "What does it keep you from doing?"  (e.g., Scale 1-10; mild, moderate, or severe)   - MILD (1-3): doesn't interfere with normal activities    - MODERATE (4-7): interferes with normal  activities or awakens from sleep    - SEVERE (8-10): excruciating pain, unable to do any normal activities        4-5 out of 10 5. RECURRENT SYMPTOM: "Have you ever had headaches before?" If Yes, ask: "When was the last time?" and "What happened that time?"      no 6. CAUSE: "What do you think is causing the headache?"     unsure 7. MIGRAINE: "Have you been diagnosed with migraine headaches?" If Yes, ask: "Is this headache similar?"      no 8. HEAD INJURY: "Has there been any recent injury to the head?"      no 9. OTHER SYMPTOMS: "Do you have any other symptoms?" (fever, stiff neck, eye pain, sore throat, cold symptoms)     Neck pain, episode of lightheadedness yesterday that didn't last  Protocols used: Headache-A-AH

## 2023-06-27 NOTE — ED Triage Notes (Signed)
 Pt reports pain at base of her skull since Wednesday. "Sore when I touch my head back there". Denies dizziness, weakness, nausea, vomiting, light sensitivity. Points to left side of neck up to base of skull for location.States she took a tylenol yesterday with some relief

## 2023-06-27 NOTE — Discharge Instructions (Addendum)
 As discussed recommend taking Aleve or ibuprofen for 2 or 3 days to decrease the inflammation in your neck which is causing you to experience the neck and lower head pain. Also recommend applying warm heat to your neck which also help improve symptoms.  Also purchase over-the-counter lidocaine patches alleviate your pain.  Symptoms have not improved or if at any point they worsen or become severe you develop any neurological symptoms go immediately to the emergency department.

## 2023-06-27 NOTE — Telephone Encounter (Signed)
 Noted. Dm/cma

## 2023-06-27 NOTE — ED Provider Notes (Signed)
 [facility]    CSN: [id number] Arrival date & time: 06/27/23  1305      History   Chief Complaint Chief Complaint  Patient presents with   Headache    Entered by patient    HPI Haley Shah is a 83 y.o. female.   Patient with a history of hypertension, anxiety, allergies and GERD presents today with a 3-day history of pain on the left side of her neck radiating into the lower portion of her head.  She reports the pain is localized.  She is uncertain what precipitated the pain but endorses that she sits up in bed reading every night.  She did not take any medication until last night she trialed a dose of acetaminophen reports pain did improve temporarily.  Her doctor's office because she normally does not have headaches and she was concerned needed for her friends have had TIAs or strokes.  Denies any dizziness, weakness, changes in vision or changes in strength.  Pain does not interfered with her normal activities.  Patient endorses that she is during her blood pressure readings at home as she recently had a PCP appointment a few weeks ago and her blood pressure was elevated.  She has been checking her blood pressure readings at home and her systolic's have been less than 140 continuously.  She reports a history of whitecoat syndrome.  Patient's blood pressure on recheck here is 147/74.  Denies any URI symptoms, shortness of breath any other concerns today.  Past Medical History:  Diagnosis Date   Allergy    Anxiety on occasion   Arthritis years   Cancer (HCC) 03/18/2008   Left Breast   Cataract surgery for both  3 or 4 years   Dyspnea    chronic-no cardiac reason   GERD (gastroesophageal reflux disease)    Glaucoma    Heart murmur years ago   History of thyroid cancer    Hyperlipidemia    Hypertension    no meds   IBS (irritable bowel syndrome)    Pre-diabetes    Sciatica    RLE   Thyroid disease    Hypo   Vitamin D deficiency    Wears glasses      Patient Active Problem List   Diagnosis Date Noted   Allergic rhinitis 06/04/2023   Osteopenia 01/16/2021   B12 deficiency 01/18/2020   Primary osteoarthritis of both knees 04/08/2019   COPD (chronic obstructive pulmonary disease) (HCC) 12/14/2017   White coat syndrome with hypertension 10/27/2013   Other abnormal glucose (hx of prediabetes)    Vitamin D deficiency    History of left breast cancer 11/03/2012   Hypothyroidism 03/22/2008   Hyperlipidemia 03/22/2008   GERD 03/22/2008   IBS 03/22/2008   Vertigo 03/22/2008   Thyroid Cancer,Hx 03/22/2008    Past Surgical History:  Procedure Laterality Date   BREAST LUMPECTOMY Left 2010   LEFT BREAST-snbx   CARDIAC CATHETERIZATION  2006   normal coronary arteries   CATARACT EXTRACTION, BILATERAL Bilateral    2016 and 2018, Dr. Dione Booze    CHONDROPLASTY Left 05/25/2014   Procedure: CHONDROPLASTY;  Surgeon: Jodi Geralds, MD;  Location: [facility];  Service: Orthopedics;  Laterality: Left;   COLONOSCOPY     EYE SURGERY  cataracts   KNEE ARTHROSCOPY WITH LATERAL MENISECTOMY Left 05/25/2014   Procedure: KNEE ARTHROSCOPY WITH LATERAL MENISECTOMY;  Surgeon: Jodi Geralds, MD;  Location: [facility];  Service: Orthopedics;  Laterality: Left;   KNEE  ARTHROSCOPY WITH MEDIAL MENISECTOMY Left 05/25/2014   Procedure: LEFT ARTHROSCOPY KNEE medial and lateral meniscectomy,femoral patella chondroplasty;  Surgeon: Jodi Geralds, MD;  Location: [facility];  Service: Orthopedics;  Laterality: Left;   ROTATOR CUFF REPAIR Right 05/27/2018   Dr. Luiz Blare   THYROIDECTOMY, PARTIAL  1986   partial thyroidectomy    OB History   No obstetric history on file.      Home Medications    Prior to Admission medications   Medication Sig Start Date End Date Taking? Authorizing Provider  aspirin 81 MG tablet Take 81 mg by mouth daily.   Yes [provider]  cetirizine (ZYRTEC ALLERGY) 10 MG tablet Take 10 mg  by mouth daily.   Yes [provider]  latanoprost (XALATAN) 0.005 % ophthalmic solution Place 1 drop into both eyes at bedtime.   Yes [provider]  levothyroxine (SYNTHROID) 75 MCG tablet Take 1 tablet (75 mcg total) by mouth daily before breakfast. 06/04/23  Yes Loyola Mast, MD  Multiple Vitamin (MULTIVITAMIN ADULT PO) Take by mouth every other day.   Yes [provider]  rosuvastatin (CRESTOR) 40 MG tablet Take 1 tablet (40 mg total) by mouth daily. 06/04/23  Yes Loyola Mast, MD  VITAMIN D, CHOLECALCIFEROL, PO Take 2,000 Int'l Units by mouth.   Yes [provider]  Wheat Dextrin (BENEFIBER PO) Take by mouth.   Yes [provider]  guaiFENesin (MUCINEX PO) Take by mouth. Patient not taking: Reported on 06/27/2023    [provider]    Family History Family History  Problem Relation Age of Onset   Early death Mother    Stroke Father    Cancer Father        Colon   Arthritis Father    Ovarian cancer Sister    Kidney disease Paternal Grandmother    Arthritis/Rheumatoid Paternal Grandmother    Stroke Paternal Grandfather     Social History Social History   Tobacco Use   Smoking status: Never   Smokeless tobacco: Never  Vaping Use   Vaping status: Never Used  Substance Use Topics   Alcohol use: No   Drug use: No     Allergies   Naprosyn [naproxen], Neomycin-bacitracin zn-polymyx, Propoxyphene hcl, and Darvon   Review of Systems Review of Systems  Neurological:  Positive for headaches.     Physical Exam Triage Vital Signs ED Triage Vitals  Encounter Vitals Group     BP 06/27/23 1317 (!) 183/65/ (!)147/74     Systolic BP Percentile --      Diastolic BP Percentile --      Pulse Rate 06/27/23 1317 77     Resp 06/27/23 1317 18     Temp 06/27/23 1317 97.8 F (36.6 C)     Temp Source 06/27/23 1317 Oral     SpO2 06/27/23 1317 97 %     Weight --      Height --      Head Circumference --      Peak Flow  --      Pain Score 06/27/23 1314 4     Pain Loc --      Pain Education --      Exclude from Growth Chart --    No data found.  Updated Vital Signs BP (!) 147/74 (BP Location: Right Arm)   Pulse 77   Temp 97.8 F (36.6 C) (Oral)   Resp 18   SpO2 97%   Visual Acuity  Right Eye Distance:   Left Eye Distance:   Bilateral Distance:    Right Eye Near:   Left Eye Near:    Bilateral Near:     Physical Exam Constitutional:      General: She is not in acute distress.    Appearance: She is well-developed. She is not ill-appearing.  HENT:     Head: Normocephalic and atraumatic.  Eyes:     Extraocular Movements: Extraocular movements intact.     Pupils: Pupils are equal, round, and reactive to light.  Neck:   Cardiovascular:     Rate and Rhythm: Normal rate and regular rhythm.  Pulmonary:     Effort: Pulmonary effort is normal.     Breath sounds: Normal breath sounds.  Musculoskeletal:        General: Normal range of motion.     Cervical back: Normal range of motion and neck supple. Tenderness present. No erythema or rigidity. Muscular tenderness present. No pain with movement or spinous process tenderness. Normal range of motion.  Lymphadenopathy:     Cervical: No cervical adenopathy.  Skin:    General: Skin is warm and dry.  Neurological:     Mental Status: She is alert and oriented to person, place, and time.     GCS: GCS eye subscore is 4. GCS verbal subscore is 5. GCS motor subscore is 6.      UC Treatments / Results  Labs (all labs ordered are listed, but only abnormal results are displayed) Labs Reviewed - No data to display  EKG   Radiology No results found.  Procedures Procedures (including critical care time)  Medications Ordered in UC Medications - No data to display  Initial Impression / Assessment and Plan / UC Course  I have reviewed the triage vital signs and the nursing notes.  Pertinent labs & imaging results that were available during my  care of the patient were reviewed by me and considered in my medical decision making (see chart for details).     Cervicogenic headache, present x 3 days.  Patient neurologically intact today.  BP slightly elevated however this is being managed by PCP and home readings are within expected parameters.  For cervicogenic headache to decrease inflammation recommended Aleve or ibuprofen for 2 or 3 days, along with applications of warmth and over-the-counter lidocaine patches as needed for pain.  Patient advised that if symptoms persist or at any point worsen go immediately to the emergency department.  Follow-up with PCP if pain is often subsequently reoccurs.  Patient verbalized understanding and agreement with plan today. Final Clinical Impressions(s) / UC Diagnoses   Final diagnoses:  Cervicogenic headache     Discharge Instructions      As discussed recommend taking Aleve or ibuprofen for 2 or 3 days to decrease the inflammation in your neck which is causing you to experience the neck and lower head pain. Also recommend applying warm heat to your neck which also help improve symptoms.  Also purchase over-the-counter lidocaine patches alleviate your pain.  Symptoms have not improved or if at any point they worsen or become severe you develop any neurological symptoms go immediately to the emergency department.     ED Prescriptions   None    PDMP not reviewed this encounter.   Bing Neighbors, NP 06/27/23 1350

## 2023-07-10 DIAGNOSIS — M1711 Unilateral primary osteoarthritis, right knee: Secondary | ICD-10-CM | POA: Diagnosis not present

## 2023-08-06 DIAGNOSIS — Z961 Presence of intraocular lens: Secondary | ICD-10-CM | POA: Diagnosis not present

## 2023-08-06 DIAGNOSIS — H1045 Other chronic allergic conjunctivitis: Secondary | ICD-10-CM | POA: Diagnosis not present

## 2023-08-06 DIAGNOSIS — H02831 Dermatochalasis of right upper eyelid: Secondary | ICD-10-CM | POA: Diagnosis not present

## 2023-08-06 DIAGNOSIS — H04123 Dry eye syndrome of bilateral lacrimal glands: Secondary | ICD-10-CM | POA: Diagnosis not present

## 2023-08-06 DIAGNOSIS — H02834 Dermatochalasis of left upper eyelid: Secondary | ICD-10-CM | POA: Diagnosis not present

## 2023-08-06 DIAGNOSIS — H43813 Vitreous degeneration, bilateral: Secondary | ICD-10-CM | POA: Diagnosis not present

## 2023-08-06 DIAGNOSIS — H26492 Other secondary cataract, left eye: Secondary | ICD-10-CM | POA: Diagnosis not present

## 2023-08-06 DIAGNOSIS — H401131 Primary open-angle glaucoma, bilateral, mild stage: Secondary | ICD-10-CM | POA: Diagnosis not present

## 2023-09-29 ENCOUNTER — Ambulatory Visit (INDEPENDENT_AMBULATORY_CARE_PROVIDER_SITE_OTHER)

## 2023-09-29 DIAGNOSIS — Z0000 Encounter for general adult medical examination without abnormal findings: Secondary | ICD-10-CM | POA: Diagnosis not present

## 2023-09-29 NOTE — Patient Instructions (Signed)
 Haley Shah , Thank you for taking time out of your busy schedule to complete your Annual Wellness Visit with me. I enjoyed our conversation and look forward to speaking with you again next year. I, as well as your care team,  appreciate your ongoing commitment to your health goals. Please review the following plan we discussed and let me know if I can assist you in the future. Your Game plan/ To Do List    Referrals: If you haven't heard from the office you've been referred to, please reach out to them at the phone provided.  N/a Follow up Visits: Next Medicare AWV with our clinical staff: 10/01/2024 at 10:50   Have you seen your provider in the last 6 months (3 months if uncontrolled diabetes)? Yes Next Office Visit with your provider: 12/08/2023 at 10:40  Clinician Recommendations:  Aim for 30 minutes of exercise or brisk walking, 6-8 glasses of water, and 5 servings of fruits and vegetables each day.       This is a list of the screening recommended for you and due dates:  Health Maintenance  Topic Date Due   COVID-19 Vaccine (4 - 2024-25 season) 11/17/2022   Flu Shot  10/17/2023   Mammogram  03/19/2024   Medicare Annual Wellness Visit  09/28/2024   DTaP/Tdap/Td vaccine (3 - Td or Tdap) 11/30/2028   Pneumococcal Vaccine for age over 40  Completed   DEXA scan (bone density measurement)  Completed   Hepatitis B Vaccine  Aged Out   HPV Vaccine  Aged Out   Meningitis B Vaccine  Aged Out   Zoster (Shingles) Vaccine  Discontinued    Advanced directives: (In Chart) A copy of your advanced directives are scanned into your chart should your provider ever need it. Advance Care Planning is important because it:  [x]  Makes sure you receive the medical care that is consistent with your values, goals, and preferences  [x]  It provides guidance to your family and loved ones and reduces their decisional burden about whether or not they are making the right decisions based on your  wishes.  Follow the link provided in your after visit summary or read over the paperwork we have mailed to you to help you started getting your Advance Directives in place. If you need assistance in completing these, please reach out to us  so that we can help you!  See attachments for Preventive Care and Fall Prevention Tips.

## 2023-09-29 NOTE — Progress Notes (Signed)
 Subjective:   Haley Shah is a 83 y.o. who presents for a Medicare Wellness preventive visit.  As a reminder, Annual Wellness Visits don't include a physical exam, and some assessments may be limited, especially if this visit is performed virtually. We may recommend an in-person follow-up visit with your provider if needed.  Visit Complete: Virtual I connected with  Haley Shah on 09/29/23 by a audio enabled telemedicine application and verified that I am speaking with the correct person using two identifiers.  Patient Location: Home  Provider Location: Office/Clinic  I discussed the limitations of evaluation and management by telemedicine. The patient expressed understanding and agreed to proceed.  Vital Signs: Because this visit was a virtual/telehealth visit, some criteria may be missing or patient reported. Any vitals not documented were not able to be obtained and vitals that have been documented are patient reported.  VideoError- Librarian, academic were attempted between this provider and patient, however failed, due to patient having technical difficulties OR patient did not have access to video capability.  We continued and completed visit with audio only.   Persons Participating in Visit: Patient.  AWV Questionnaire: No: Patient Medicare AWV questionnaire was not completed prior to this visit.  Cardiac Risk Factors include: advanced age (>11men, >65 women);dyslipidemia;hypertension     Objective:    Today's Vitals   09/29/23 1047  PainSc: 5    There is no height or weight on file to calculate BMI.     09/29/2023   10:53 AM 06/18/2022   11:51 AM 06/11/2021   10:50 AM 06/08/2020   11:04 AM 04/08/2019    2:30 PM 03/25/2018    3:16 PM 05/27/2017   10:59 AM  Advanced Directives  Does Patient Have a Medical Advance Directive? Yes Yes Yes Yes Yes Yes  Yes   Type of Estate agent of [place];Living will Healthcare Power of  [place];Living will Healthcare Power of [place];Living will Healthcare Power of [place];Living will Healthcare Power of [place];Living will Healthcare Power of [place];Living will Healthcare Power of [place];Living will  Does patient want to make changes to medical advance directive?  No - Patient declined No - Patient declined No - Patient declined No - Patient declined No - Patient declined    Copy of Healthcare Power of Attorney in Chart? Yes - validated most recent copy scanned in chart (See row information) No - copy requested No - copy requested Yes - validated most recent copy scanned in chart (See row information) Yes - validated most recent copy scanned in chart (See row information)  No - copy requested      Data saved with a previous flowsheet row definition    Current Medications (verified) Outpatient Encounter Medications as of 09/29/2023  Medication Sig   cetirizine (ZYRTEC ALLERGY) 10 MG tablet Take 10 mg by mouth daily.   latanoprost (XALATAN) 0.005 % ophthalmic solution Place 1 drop into both eyes at bedtime.   levothyroxine  (SYNTHROID ) 75 MCG tablet Take 1 tablet (75 mcg total) by mouth daily before breakfast.   Multiple Vitamin (MULTIVITAMIN ADULT PO) Take by mouth every other day. (Patient taking differently: Take by mouth daily.)   rosuvastatin  (CRESTOR ) 40 MG tablet Take 1 tablet (40 mg total) by mouth daily.   VITAMIN D , CHOLECALCIFEROL, PO Take 2,000 Int'l Units by mouth.   Wheat Dextrin (BENEFIBER PO) Take by mouth.   aspirin 81 MG tablet Take 81 mg by mouth daily.   guaiFENesin (MUCINEX PO) Take  by mouth. (Patient not taking: Reported on 06/27/2023)   No facility-administered encounter medications on file as of 09/29/2023.    Allergies (verified) Naprosyn [naproxen], Neomycin-bacitracin zn-polymyx, Propoxyphene hcl, and Darvon   History: Past Medical History:  Diagnosis Date   Allergy    Anxiety on occasion   Arthritis years   Cancer (HCC) 03/18/2008    Left Breast   Cataract surgery for both  3 or 4 years   Dyspnea    chronic-no cardiac reason   GERD (gastroesophageal reflux disease)    Glaucoma    Heart murmur years ago   History of thyroid  cancer    Hyperlipidemia    Hypertension    no meds   IBS (irritable bowel syndrome)    Pre-diabetes    Sciatica    RLE   Thyroid  disease    Hypo   Vitamin D  deficiency    Wears glasses    Past Surgical History:  Procedure Laterality Date   BREAST LUMPECTOMY Left 2010   LEFT BREAST-snbx   CARDIAC CATHETERIZATION  2006   normal coronary arteries   CATARACT EXTRACTION, BILATERAL Bilateral    2016 and 2018, Dr. Octavia    CHONDROPLASTY Left 05/25/2014   Procedure: CHONDROPLASTY;  Surgeon: Norleen Gavel, MD;  Location: [facility];  Service: Orthopedics;  Laterality: Left;   COLONOSCOPY     EYE SURGERY  cataracts   KNEE ARTHROSCOPY WITH LATERAL MENISECTOMY Left 05/25/2014   Procedure: KNEE ARTHROSCOPY WITH LATERAL MENISECTOMY;  Surgeon: Norleen Gavel, MD;  Location: [facility];  Service: Orthopedics;  Laterality: Left;   KNEE ARTHROSCOPY WITH MEDIAL MENISECTOMY Left 05/25/2014   Procedure: LEFT ARTHROSCOPY KNEE medial and lateral meniscectomy,femoral patella chondroplasty;  Surgeon: Norleen Gavel, MD;  Location: [facility];  Service: Orthopedics;  Laterality: Left;   ROTATOR CUFF REPAIR Right 05/27/2018   Dr. Gavel   THYROIDECTOMY, PARTIAL  1986   partial thyroidectomy   Family History  Problem Relation Age of Onset   Early death Mother    Stroke Father    Cancer Father        Colon   Arthritis Father    Ovarian cancer Sister    Kidney disease Paternal Grandmother    Arthritis/Rheumatoid Paternal Grandmother    Stroke Paternal Grandfather    Social History   Socioeconomic History   Marital status: Married    Spouse name: Not on file   Number of children: 2   Years of education: Not on file   Highest education level: Associate degree:  occupational, Scientist, product/process development, or vocational program  Occupational History   Not on file  Tobacco Use   Smoking status: Never   Smokeless tobacco: Never  Vaping Use   Vaping status: Never Used  Substance and Sexual Activity   Alcohol use: No   Drug use: No   Sexual activity: Not Currently    Birth control/protection: Post-menopausal  Other Topics Concern   Not on file  Social History Narrative   Not on file   Social Drivers of Health   Financial Resource Strain: Low Risk  (09/26/2023)   Overall Financial Resource Strain (CARDIA)    Difficulty of Paying Living Expenses: Not hard at all  Food Insecurity: No Food Insecurity (09/26/2023)   Hunger Vital Sign    Worried About Running Out of Food in the Last Year: Never true    Ran Out of Food in the Last Year: Never true  Transportation Needs: No Transportation Needs (09/26/2023)  PRAPARE - Administrator, Civil Service (Medical): No    Lack of Transportation (Non-Medical): No  Physical Activity: Inactive (09/26/2023)   Exercise Vital Sign    Days of Exercise per Week: 0 days    Minutes of Exercise per Session: 0 min  Stress: Stress Concern Present (09/26/2023)   [employer] of Occupational Health - Occupational Stress Questionnaire    Feeling of Stress: To some extent  Social Connections: Socially Integrated (09/26/2023)   Social Connection and Isolation Panel    Frequency of Communication with Friends and Family: More than three times a week    Frequency of Social Gatherings with Friends and Family: Twice a week    Attends Religious Services: 1 to 4 times per year    Active Member of [employer] or Organizations: Yes    Attends Engineer, structural: Not on file    Marital Status: Married    Tobacco Counseling Counseling given: Not Answered    Clinical Intake:  Pre-visit preparation completed: Yes  Pain : 0-10 Pain Score: 5  Pain Type: Chronic pain Pain Location: Knee Pain Orientation: Right Pain  Descriptors / Indicators: Aching Pain Onset: More than a month ago Pain Frequency: Constant     Nutritional Risks: None Diabetes: No  Lab Results  Component Value Date   HGBA1C 5.4 01/20/2023   HGBA1C 5.5 01/16/2022   HGBA1C 5.2 01/16/2021     How often do you need to have someone help you when you read instructions, pamphlets, or other written materials from your doctor or pharmacy?: 1 - Never  Interpreter Needed?: No  Information entered by :: NAllen LPN   Activities of Daily Living     09/29/2023   10:48 AM  In your present state of health, do you have any difficulty performing the following activities:  Hearing? 0  Vision? 0  Difficulty concentrating or making decisions? 0  Walking or climbing stairs? 1  Dressing or bathing? 0  Doing errands, shopping? 0  Preparing Food and eating ? N  Using the Toilet? N  In the past six months, have you accidently leaked urine? N  Do you have problems with loss of bowel control? N  Managing your Medications? N  Managing your Finances? N  Housekeeping or managing your Housekeeping? N    Patient Care Team: Thedora Garnette HERO, MD as PCP - General (Family Medicine) Octavia, Charlie Hamilton, MD as Consulting Physician (Ophthalmology) Yvone Rush, MD as Consulting Physician (Orthopedic Surgery) Joshua Sieving, MD as Consulting Physician (Dermatology)  I have updated your Care Teams any recent Medical Services you may have received from other providers in the past year.     Assessment:   This is a routine wellness examination for Zakiyyah.  Hearing/Vision screen Hearing Screening - Comments:: Denies hearing issues Vision Screening - Comments:: Regular eye exams, Dr. Hamilton Octavia   Goals Addressed             This Visit's Progress    Patient Stated       09/29/2023, stay healthy       Depression Screen     09/29/2023   10:56 AM 06/04/2023    1:51 PM 06/18/2022   11:51 AM 06/11/2021   10:50 AM 01/16/2021   10:11 AM 06/08/2020    11:10 AM 01/17/2020   10:32 AM  PHQ 2/9 Scores  PHQ - 2 Score 0 0 0 0 0 0 0  PHQ- 9 Score 4 1  Fall Risk     09/29/2023   10:55 AM 06/04/2023    1:51 PM 06/18/2022   11:50 AM 06/11/2021   10:50 AM 06/08/2020   11:10 AM  Fall Risk   Falls in the past year? 0 0 0 0 0  Number falls in past yr: 0 0 0 0 0  Injury with Fall? 0 0 0 0 0  Risk for fall due to : Medication side effect No Fall Risks No Fall Risks No Fall Risks No Fall Risks  Follow up Falls evaluation completed;Falls prevention discussed Falls evaluation completed Falls evaluation completed;Falls prevention discussed Falls evaluation completed;Falls prevention discussed  Falls evaluation completed;Falls prevention discussed      Data saved with a previous flowsheet row definition    MEDICARE RISK AT HOME:  Medicare Risk at Home Any stairs in or around the home?: Yes If so, are there any without handrails?: No Home free of loose throw rugs in walkways, pet beds, electrical cords, etc?: Yes Adequate lighting in your home to reduce risk of falls?: Yes Life alert?: No Use of a cane, walker or w/c?: Yes Grab bars in the bathroom?: No Shower chair or bench in shower?: No Elevated toilet seat or a handicapped toilet?: Yes  TIMED UP AND GO:  Was the test performed?  No  Cognitive Function: 6CIT completed        09/29/2023   10:59 AM  6CIT Screen  What Year? 0 points  What month? 0 points  What time? 0 points  Count back from 20 0 points  Months in reverse 0 points  Repeat phrase 0 points  Total Score 0 points    Immunizations Immunization History  Administered Date(s) Administered   Fluad Quad(high Dose 65+) 12/30/2021, 12/18/2022   Influenza, High Dose Seasonal PF 01/05/2013, 01/05/2015, 11/27/2015, 12/20/2016, 12/15/2017, 12/17/2018, 01/17/2020, 01/01/2021   PFIZER(Purple Top)SARS-COV-2 Vaccination 04/25/2019, 05/19/2019, 03/13/2020   Pneumococcal Conjugate-13 03/02/2015   Pneumococcal Polysaccharide-23  03/18/2008   Td 03/18/2005   Tdap 12/01/2018    Screening Tests Health Maintenance  Topic Date Due   COVID-19 Vaccine (4 - 2024-25 season) 11/17/2022   INFLUENZA VACCINE  10/17/2023   MAMMOGRAM  03/19/2024   Medicare Annual Wellness (AWV)  09/28/2024   DTaP/Tdap/Td (3 - Td or Tdap) 11/30/2028   Pneumococcal Vaccine: 50+ Years  Completed   DEXA SCAN  Completed   Hepatitis B Vaccines  Aged Out   HPV VACCINES  Aged Out   Meningococcal B Vaccine  Aged Out   Zoster Vaccines- Shingrix  Discontinued    Health Maintenance  Health Maintenance Due  Topic Date Due   COVID-19 Vaccine (4 - 2024-25 season) 11/17/2022   Health Maintenance Items Addressed: Declines covid vaccines.  Additional Screening:  Vision Screening: Recommended annual ophthalmology exams for early detection of glaucoma and other disorders of the eye. Would you like a referral to an eye doctor? No    Dental Screening: Recommended annual dental exams for proper oral hygiene  Community Resource Referral / Chronic Care Management: CRR required this visit?  No   CCM required this visit?  No   Plan:    I have personally reviewed and noted the following in the patient's chart:   Medical and social history Use of alcohol, tobacco or illicit drugs  Current medications and supplements including opioid prescriptions. Patient is not currently taking opioid prescriptions. Functional ability and status Nutritional status Physical activity Advanced directives List of other physicians Hospitalizations, surgeries, and ER visits in previous  12 months Vitals Screenings to include cognitive, depression, and falls Referrals and appointments  In addition, I have reviewed and discussed with patient certain preventive protocols, quality metrics, and best practice recommendations. A written personalized care plan for preventive services as well as general preventive health recommendations were provided to  patient.   Ardella FORBES Dawn, LPN   2/85/7974   After Visit Summary: (MyChart) Due to this being a telephonic visit, the after visit summary with patients personalized plan was offered to patient via MyChart   Notes: Nothing significant to report at this time.

## 2023-10-16 DIAGNOSIS — M1711 Unilateral primary osteoarthritis, right knee: Secondary | ICD-10-CM | POA: Diagnosis not present

## 2023-12-08 ENCOUNTER — Encounter: Payer: Self-pay | Admitting: Family Medicine

## 2023-12-08 ENCOUNTER — Ambulatory Visit (INDEPENDENT_AMBULATORY_CARE_PROVIDER_SITE_OTHER): Admitting: Family Medicine

## 2023-12-08 VITALS — BP 124/70 | HR 59 | Temp 97.1°F | Ht 60.5 in | Wt 124.2 lb

## 2023-12-08 DIAGNOSIS — I10 Essential (primary) hypertension: Secondary | ICD-10-CM | POA: Diagnosis not present

## 2023-12-08 DIAGNOSIS — J449 Chronic obstructive pulmonary disease, unspecified: Secondary | ICD-10-CM

## 2023-12-08 DIAGNOSIS — R202 Paresthesia of skin: Secondary | ICD-10-CM | POA: Diagnosis not present

## 2023-12-08 DIAGNOSIS — E782 Mixed hyperlipidemia: Secondary | ICD-10-CM

## 2023-12-08 DIAGNOSIS — E039 Hypothyroidism, unspecified: Secondary | ICD-10-CM

## 2023-12-08 DIAGNOSIS — R200 Anesthesia of skin: Secondary | ICD-10-CM | POA: Diagnosis not present

## 2023-12-08 DIAGNOSIS — Z23 Encounter for immunization: Secondary | ICD-10-CM

## 2023-12-08 NOTE — Assessment & Plan Note (Signed)
 Exam is not consistent with a peripheral nerve issue. She could be having a cervical radiculopathy. This has been intermittent. Discussed her using a more supportive pillow at night to see if this will help.

## 2023-12-08 NOTE — Assessment & Plan Note (Signed)
 Last TSH was at goal. Continue levothyroxine 75 mcg daily.

## 2023-12-08 NOTE — Assessment & Plan Note (Signed)
 Last lipids were at goal. Continue rosuvastatin 40 mg daily.

## 2023-12-08 NOTE — Progress Notes (Signed)
 [facility] [address] [place] [address] Dept: [phone] Dept Fax: [phone]  Chronic Care Office Visit  Subjective:    Patient ID: Haley Shah, female    DOB: Feb 25, 1941, 83 y.o..   MRN: [id number]  Chief Complaint  Patient presents with   Hypertension    6 month f/u HTN.  C/o tingling in both fingers at night off/on.     History of Present Illness:  Patient is in today for reassessment of chronic medical issues.  Ms. Heck has hypertension with some white coat component. She has been monitoring this at home. Her 6 month average has been 132/66.   Ms. Stovall has hyperlipidemia. She is managed on rosuvastatin  40 mg daily.   Ms. Birdsall had a partial thyroidectomy in 1986 due to a thyroid  cancer. She is now managed on levothyroxine  75 mcg daily.  Ms. Barnier notes that she has been having issues with intermittent tingling of her fingers/hands. She notes this typically can occur when waking up int he morning. she is not engaged in any particular repetitive motion of her hands. She was diagnosed with carpal tunnel syndrome years ago. She was offered surgery at that time, but decided not to proceed.  Past Medical History: Patient Active Problem List   Diagnosis Date Noted   Numbness and tingling in both hands 12/08/2023   Allergic rhinitis 06/04/2023   Osteopenia 01/16/2021   B12 deficiency 01/18/2020   Primary osteoarthritis of both knees 04/08/2019   COPD (chronic obstructive pulmonary disease) (HCC) 12/14/2017   White coat syndrome with hypertension 10/27/2013   Other abnormal glucose (hx of prediabetes)    Vitamin D  deficiency    History of left breast cancer 11/03/2012   Hypothyroidism 03/22/2008   Hyperlipidemia 03/22/2008   GERD 03/22/2008   IBS 03/22/2008   Vertigo 03/22/2008   Thyroid  Cancer,Hx 03/22/2008   Past Surgical History:  Procedure Laterality Date   BREAST LUMPECTOMY Left  2010   LEFT BREAST-snbx   CARDIAC CATHETERIZATION  2006   normal coronary arteries   CATARACT EXTRACTION, BILATERAL Bilateral    2016 and 2018, Dr. Octavia    CHONDROPLASTY Left 05/25/2014   Procedure: CHONDROPLASTY;  Surgeon: Norleen Gavel, MD;  Location: [facility];  Service: Orthopedics;  Laterality: Left;   COLONOSCOPY     EYE SURGERY  cataracts   KNEE ARTHROSCOPY WITH LATERAL MENISECTOMY Left 05/25/2014   Procedure: KNEE ARTHROSCOPY WITH LATERAL MENISECTOMY;  Surgeon: Norleen Gavel, MD;  Location: [facility];  Service: Orthopedics;  Laterality: Left;   KNEE ARTHROSCOPY WITH MEDIAL MENISECTOMY Left 05/25/2014   Procedure: LEFT ARTHROSCOPY KNEE medial and lateral meniscectomy,femoral patella chondroplasty;  Surgeon: Norleen Gavel, MD;  Location: [facility];  Service: Orthopedics;  Laterality: Left;   ROTATOR CUFF REPAIR Right 05/27/2018   Dr. Gavel   THYROIDECTOMY, PARTIAL  1986   partial thyroidectomy   Family History  Problem Relation Age of Onset   Early death Mother    Stroke Father    Cancer Father        Colon   Arthritis Father    Ovarian cancer Sister    Kidney disease Paternal Grandmother    Arthritis/Rheumatoid Paternal Grandmother    Stroke Paternal Grandfather    Outpatient Medications Prior to Visit  Medication Sig Dispense Refill   cetirizine (ZYRTEC ALLERGY) 10 MG tablet Take 10 mg by mouth daily.     guaiFENesin (MUCINEX PO) Take by mouth. (Patient taking  differently: Take by mouth as needed.)     latanoprost (XALATAN) 0.005 % ophthalmic solution Place 1 drop into both eyes at bedtime.     levothyroxine  (SYNTHROID ) 75 MCG tablet Take 1 tablet (75 mcg total) by mouth daily before breakfast. 90 tablet 3   Multiple Vitamin (MULTIVITAMIN ADULT PO) Take by mouth every other day.     rosuvastatin  (CRESTOR ) 40 MG tablet Take 1 tablet (40 mg total) by mouth daily. 90 tablet 3   VITAMIN D , CHOLECALCIFEROL, PO Take 2,000 Int'l  Units by mouth.     Wheat Dextrin (BENEFIBER PO) Take by mouth.     aspirin 81 MG tablet Take 81 mg by mouth daily.     No facility-administered medications prior to visit.   Allergies  Allergen Reactions   Naprosyn [Naproxen]     Raw mouth   Neomycin-Bacitracin Zn-Polymyx     REACTION: rash   Propoxyphene Hcl     REACTION: syncope   Darvon Other (See Comments)    Almost fainted when taking, over 50 years.   Objective:   Today's Vitals   12/08/23 1034  BP: 124/70  Pulse: (!) 59  Temp: (!) 97.1 F (36.2 C)  TempSrc: Temporal  SpO2: 98%  Weight: 124 lb 3.2 oz (56.3 kg)  Height: 5' 0.5 (1.537 m)   Body mass index is 23.86 kg/m.   General: Well developed, well nourished. No acute distress. Neck: Supple.Good ROM. No pain on palpation. Extremities: Full ROM of UEs. Minor arthritic changes of DIP joints.  Neuro: Tinel and Phalen's tests are negative. Psych: Alert and oriented. Normal mood and affect.  Health Maintenance Due  Topic Date Due   Influenza Vaccine  10/17/2023     Assessment & Plan:   Problem List Items Addressed This Visit       Cardiovascular and Mediastinum   White coat syndrome with hypertension   BP average is borderline high. I recommend we continue to monitor this.        Respiratory   COPD (chronic obstructive pulmonary disease) (HCC)   Stable. Symptoms are not significant enough to warrant medication.        Endocrine   Hypothyroidism   Last TSH was at goal. Continue levothyroxine  75 mcg daily.        Other   Hyperlipidemia   Last lipids were at goal. Continue rosuvastatin  40 mg daily.      Numbness and tingling in both hands   Exam is not consistent with a peripheral nerve issue. She could be having a cervical radiculopathy. This has been intermittent. Discussed her using a more supportive pillow at night to see if this will help.      Other Visit Diagnoses       Need for immunization against influenza    -  Primary    Relevant Orders   Flu vaccine HIGH DOSE PF(Fluzone Trivalent) (Completed)       Return in about 6 months (around 06/06/2024) for Reassessment.   Garnette CHRISTELLA Simpler, MD

## 2023-12-08 NOTE — Assessment & Plan Note (Signed)
 Stable. Symptoms are not significant enough to warrant medication.

## 2023-12-08 NOTE — Assessment & Plan Note (Signed)
 BP average is borderline high. I recommend we continue to monitor this.

## 2024-01-20 ENCOUNTER — Encounter: Payer: PPO | Admitting: Nurse Practitioner

## 2024-02-17 ENCOUNTER — Telehealth: Payer: Self-pay | Admitting: Family Medicine

## 2024-02-17 DIAGNOSIS — E782 Mixed hyperlipidemia: Secondary | ICD-10-CM

## 2024-02-17 NOTE — Progress Notes (Signed)
 Pharmacy Quality Measure Review  This patient is appearing on a report for being at risk of failing the adherence measure for cholesterol (statin) medications this calendar year.   Medication: Rosuvastatin  40mg  Last fill date: 06/17 for 90 day supply  States she is still taking the medication, takes it Monday-Friday, skips Saturday and Sunday. States that she has had leg cramps in the past, and is unsure when she started taking rosuvastatin  this way, but it has helped lessen the cramps.  She still has leg cramps all the time, but is unsure if this is due to taking her statin or whether this is due to needing a knee replacement. Discussed how her last lipid panel was 01/2023 (LDL controlled/<100), and a repeat panel may be drawn at upcoming appointments (next PCP appointment scheduled for 06/07/24).   Will send message to PCP to update med list and discuss rosuvastatin  use at next visit. All questions from the patient were answered.  Angela Baalmann, PharmD [facility] Pharmacist

## 2024-03-12 ENCOUNTER — Ambulatory Visit: Payer: Self-pay

## 2024-03-12 ENCOUNTER — Encounter: Payer: Self-pay | Admitting: Family Medicine

## 2024-03-12 NOTE — Telephone Encounter (Signed)
 FYI Only or Action Required?: FYI only for provider: appointment scheduled on 12/29.  Patient was last seen in primary care on 12/08/2023 by Thedora Garnette HERO, MD.  Called Nurse Triage reporting Numbness.  Symptoms began several months ago.  Interventions attempted: Nothing.  Symptoms are: gradually worsening.  Triage Disposition: See PCP When Office is Open (Within 3 Days)  Patient/caregiver understands and will follow disposition?:   Copied from CRM #[id number]. Topic: Clinical - Red Word Triage >> Mar 12, 2024 12:24 PM Lonell PEDLAR wrote: Red Word that prompted transfer to Nurse Triage: tingling and numbness  right hand and arm mostly at night. Neck stiffness and soreness Reason for Disposition  [1] Numbness or tingling in one or both hands AND [2] is a chronic symptom (recurrent or ongoing AND present > 4 weeks)  Answer Assessment - Initial Assessment Questions 1. SYMPTOM: What is the main symptom you are concerned about? (e.g., weakness, numbness)     Numbness/tingling to R arm, occasionally in L hand. She's talked to Dr Thedora about it in the past and received no instruction or referral for it  2. ONSET: When did this start? (e.g., minutes, hours, days; while sleeping)     Numbness/tingling to R hand x 2-3 months  3. LAST NORMAL: When was the last time you (the patient) were normal (no symptoms)?     2-3 months ago  4. PATTERN Does this come and go, or has it been constant since it started?  Is it present now?     Comes and goes, worse at night, wakes me up pt reports  5. CARDIAC SYMPTOMS: Have you had any of the following symptoms: chest pain, difficulty breathing, palpitations?     Denies  6. NEUROLOGIC SYMPTOMS: Have you had any of the following symptoms: headache, dizziness, vision loss, double vision, changes in speech, unsteady on your feet?     Denies  7. OTHER SYMPTOMS: Do you have any other symptoms?      Neck stiff sore x 4 days, I don't really know  why, I don't remember doing anything to it Numbness to R hand x 2-3 months  Protocols used: Neurologic Deficit-A-AH

## 2024-03-15 ENCOUNTER — Ambulatory Visit (INDEPENDENT_AMBULATORY_CARE_PROVIDER_SITE_OTHER): Admitting: Emergency Medicine

## 2024-03-15 ENCOUNTER — Encounter: Payer: Self-pay | Admitting: Emergency Medicine

## 2024-03-15 VITALS — BP 132/76 | HR 72 | Resp 16 | Ht 60.5 in | Wt 127.0 lb

## 2024-03-15 DIAGNOSIS — R200 Anesthesia of skin: Secondary | ICD-10-CM

## 2024-03-15 DIAGNOSIS — M542 Cervicalgia: Secondary | ICD-10-CM | POA: Diagnosis not present

## 2024-03-15 DIAGNOSIS — R202 Paresthesia of skin: Secondary | ICD-10-CM

## 2024-03-15 MED ORDER — PREDNISONE 10 MG PO TABS: ORAL_TABLET | ORAL | 0 refills | Status: AC

## 2024-03-15 NOTE — Assessment & Plan Note (Signed)
 We discussed potential etiologies including cervical radiculopathy and peripheral nerve compression syndromes.  Given some exam findings today that could suggest a degree of carpal tunnel I recommended a nighttime wrist splint and we can try a short prednisone  course which will also likely help with the neck.  We discussed next up to include orthopedics evaluation versus advanced imaging of the neck versus nerve conduction testing and she will plan to schedule follow-up with orthopedist who cared for her husband when he had some similar issues.  If she cannot get in with them in the next 4 weeks, she will reach back out to us  and we could go ahead and order some advanced imaging. Orders:   predniSONE  (DELTASONE ) 10 MG tablet; Take 3 tablets (30 mg total) by mouth daily for 2 days, THEN 2 tablets (20 mg total) daily for 2 days, THEN 1 tablet (10 mg total) daily for 2 days.

## 2024-03-15 NOTE — Progress Notes (Signed)
 "   Assessment & Plan:   Assessment & Plan Neck pain on left side- improving No acute intervention or diagnostics needed.  Continue supportive measures.    Numbness and tingling in both hands- more prominent on right We discussed potential etiologies including cervical radiculopathy and peripheral nerve compression syndromes.  Given some exam findings today that could suggest a degree of carpal tunnel I recommended a nighttime wrist splint and we can try a short prednisone  course which will also likely help with the neck.  We discussed next up to include orthopedics evaluation versus advanced imaging of the neck versus nerve conduction testing and she will plan to schedule follow-up with orthopedist who cared for her husband when he had some similar issues.  If she cannot get in with them in the next 4 weeks, she will reach back out to us  and we could go ahead and order some advanced imaging. Orders:   predniSONE  (DELTASONE ) 10 MG tablet; Take 3 tablets (30 mg total) by mouth daily for 2 days, THEN 2 tablets (20 mg total) daily for 2 days, THEN 1 tablet (10 mg total) daily for 2 days.     Corean Geralds, MSPAS, PA-C    Subjective:   Chief Complaint  Patient presents with   Hand Pain    Daily bilateral hand numbness, tingling and pain since August.   Neck Pain    Neck pain x 4 days- pain is improving and almost gone.     HPI: Haley Shah is a 83 y.o. female presenting on 03/15/2024 with report of ongoing hand tingling and some left-sided neck pain Neck pain present upon waking 4 days ago.  No injury.  No associated vision loss, acute balance changes, severe headaches.  Worse with turning the head to the left.  Has tried heat and ice as well as massage and these are all providing significant relief and it is currently almost completely gone. Also bilateral hand tingling and numbness though reports it is rare to the left side.  It is worse in the middle of the night in the right  hand, and improves if she elevates the hand when the symptoms wake her up.  Occasionally with symptoms during the day but seems to improve as she goes about her normal activities and works it out.  Symptoms were brought up to PCP 3 months ago and were more mild at that time. Previous A&P as below:  Numbness and tingling in both hands     Exam is not consistent with a peripheral nerve issue. She could be having a cervical radiculopathy. This has been intermittent. Discussed her using a more supportive pillow at night to see if this will help.         ROS: Negative unless specifically indicated above in HPI.   Relevant past medical history reviewed and updated as indicated.   Allergies and medications reviewed and updated.  Current Medications[1]  Allergies[2]  Social History[3]   Objective:   Vitals:   03/15/24 0847  BP: 132/76  Pulse: 72  Resp: 16  Height: 5' 0.5 (1.537 m)  Weight: 127 lb (57.6 kg)  SpO2: 96%  BMI (Calculated): 24.39     Appears well, in no acute distress Sclera anicteric Oral mucosa moist Normal resp effort and excursion.  Neck no midline bony tenderness.  There is some mild tenderness to the paravertebral muscles bilaterally though worse on the left.  No overlying skin discoloration/rash.  She has good range of motion about  the neck with minimal discomfort on rotation to the left. Bilateral hands are nontender and without acute edema.  Moderate OA changes are noted to each hand.  She has strong grip, push, pull bilaterally.  There is no thenar or hypothenar muscle wasting.  Sensation is portably intact to light touch to all nerve distributions of bilateral hands.  Positive Phalen but negative Tinel's on right, both negative on the left.  Negative Spurling.  Symmetric 2+ radial pulses.     [1]  Current Outpatient Medications:    cetirizine (ZYRTEC ALLERGY) 10 MG tablet, Take 10 mg by mouth daily., Disp: , Rfl:    latanoprost (XALATAN) 0.005 %  ophthalmic solution, Place 1 drop into both eyes at bedtime., Disp: , Rfl:    levothyroxine  (SYNTHROID ) 75 MCG tablet, Take 1 tablet (75 mcg total) by mouth daily before breakfast., Disp: 90 tablet, Rfl: 3   rosuvastatin  (CRESTOR ) 40 MG tablet, Take 1 tablet (40 mg total) by mouth daily., Disp: 90 tablet, Rfl: 3   VITAMIN D , CHOLECALCIFEROL, PO, Take 2,000 Int'l Units by mouth., Disp: , Rfl:    Wheat Dextrin (BENEFIBER PO), Take by mouth., Disp: , Rfl:    guaiFENesin (MUCINEX PO), Take by mouth. (Patient not taking: Reported on 03/15/2024), Disp: , Rfl:    Multiple Vitamin (MULTIVITAMIN ADULT PO), Take by mouth every other day. (Patient not taking: Reported on 03/15/2024), Disp: , Rfl:  [2]  Allergies Allergen Reactions   Naprosyn [Naproxen]     Raw mouth   Neomycin-Bacitracin Zn-Polymyx     REACTION: rash   Propoxyphene Hcl     REACTION: syncope   Darvon Other (See Comments)    Almost fainted when taking, over 50 years.  [3]  Social History Tobacco Use   Smoking status: Never   Smokeless tobacco: Never  Vaping Use   Vaping status: Never Used  Substance Use Topics   Alcohol use: No   Drug use: No   "

## 2024-03-15 NOTE — Patient Instructions (Signed)
 Call for a follow-up with Dr Dozier in 3-4 weeks.  Try the wrist splint (velcro, extending from palm to mid forearm, nightly Prednisone  as prescribed Continue heat or ice as desired and massage by your husband to the neck Reach out if any issue with delay getting orthopedic appointment.

## 2024-03-16 LAB — HM MAMMOGRAPHY

## 2024-03-17 ENCOUNTER — Encounter: Payer: Self-pay | Admitting: Family Medicine

## 2024-03-19 ENCOUNTER — Encounter: Payer: Self-pay | Admitting: Family Medicine

## 2024-03-19 NOTE — Telephone Encounter (Signed)
Please review message and advise.   Thanks. Dm/cma  

## 2024-06-07 ENCOUNTER — Ambulatory Visit: Admitting: Family Medicine

## 2024-10-01 ENCOUNTER — Ambulatory Visit
